# Patient Record
Sex: Female | Born: 1965 | Race: White | Hispanic: No | Smoking: Never smoker
Health system: Southern US, Community
[De-identification: ages and names within clinical notes are randomized; demographics above are authoritative.]

## PROBLEM LIST (undated history)

## (undated) DIAGNOSIS — E079 Disorder of thyroid, unspecified: Secondary | ICD-10-CM

## (undated) DIAGNOSIS — F32A Depression, unspecified: Secondary | ICD-10-CM

## (undated) DIAGNOSIS — I10 Essential (primary) hypertension: Secondary | ICD-10-CM

## (undated) DIAGNOSIS — F329 Major depressive disorder, single episode, unspecified: Secondary | ICD-10-CM

## (undated) DIAGNOSIS — J45909 Unspecified asthma, uncomplicated: Secondary | ICD-10-CM

## (undated) DIAGNOSIS — G629 Polyneuropathy, unspecified: Secondary | ICD-10-CM

## (undated) DIAGNOSIS — F209 Schizophrenia, unspecified: Secondary | ICD-10-CM

## (undated) DIAGNOSIS — F319 Bipolar disorder, unspecified: Secondary | ICD-10-CM

## (undated) HISTORY — PX: ABDOMINAL HYSTERECTOMY: SHX81

## (undated) HISTORY — PX: KNEE SURGERY: SHX244

## (undated) HISTORY — PX: TONSILLECTOMY: SUR1361

---

## 2004-03-13 ENCOUNTER — Emergency Department: Payer: Self-pay | Admitting: Emergency Medicine

## 2004-06-07 ENCOUNTER — Emergency Department: Payer: Self-pay | Admitting: Emergency Medicine

## 2004-06-09 ENCOUNTER — Emergency Department: Payer: Self-pay | Admitting: General Practice

## 2006-12-20 ENCOUNTER — Emergency Department: Payer: Self-pay | Admitting: Emergency Medicine

## 2007-01-14 ENCOUNTER — Other Ambulatory Visit: Payer: Self-pay

## 2007-01-14 ENCOUNTER — Emergency Department: Payer: Self-pay | Admitting: Emergency Medicine

## 2007-02-15 ENCOUNTER — Emergency Department: Payer: Self-pay | Admitting: Emergency Medicine

## 2007-02-17 ENCOUNTER — Emergency Department: Payer: Self-pay | Admitting: Emergency Medicine

## 2007-03-28 ENCOUNTER — Emergency Department: Payer: Self-pay | Admitting: Emergency Medicine

## 2007-03-28 ENCOUNTER — Other Ambulatory Visit: Payer: Self-pay

## 2007-04-25 ENCOUNTER — Emergency Department: Payer: Self-pay | Admitting: Emergency Medicine

## 2007-05-26 ENCOUNTER — Other Ambulatory Visit: Payer: Self-pay

## 2007-05-26 ENCOUNTER — Emergency Department: Payer: Self-pay | Admitting: Emergency Medicine

## 2007-05-27 ENCOUNTER — Emergency Department: Payer: Self-pay | Admitting: Emergency Medicine

## 2007-05-29 ENCOUNTER — Other Ambulatory Visit: Payer: Self-pay

## 2007-05-29 ENCOUNTER — Inpatient Hospital Stay: Payer: Self-pay | Admitting: Internal Medicine

## 2007-06-21 ENCOUNTER — Emergency Department: Payer: Self-pay | Admitting: Emergency Medicine

## 2007-07-14 ENCOUNTER — Emergency Department: Payer: Self-pay | Admitting: Emergency Medicine

## 2007-08-13 ENCOUNTER — Emergency Department: Payer: Self-pay | Admitting: Emergency Medicine

## 2007-10-10 ENCOUNTER — Emergency Department: Payer: Self-pay | Admitting: Emergency Medicine

## 2007-10-10 ENCOUNTER — Other Ambulatory Visit: Payer: Self-pay

## 2007-12-06 ENCOUNTER — Emergency Department: Payer: Self-pay | Admitting: Emergency Medicine

## 2007-12-06 IMAGING — CR DG HAND COMPLETE 3+V*L*
1 series · 3 of 3 positions shown · non-contrast
Comparison: none

REASON FOR EXAM: pain, trauma
COMMENTS:

PROCEDURE:     DXR - DXR HAND LT COMPLETE  W/OBLIQUES  - [DATE]  [DATE]
RESULT:     Images of the LEFT hand demonstrate a fracture through the base
of the distal phalanx of the thumb dorsally. No additional abnormality is
seen.

[Series 1: view not recorded · 0.17mm/px · 3 of 3 slices shown]
[im 1/3]
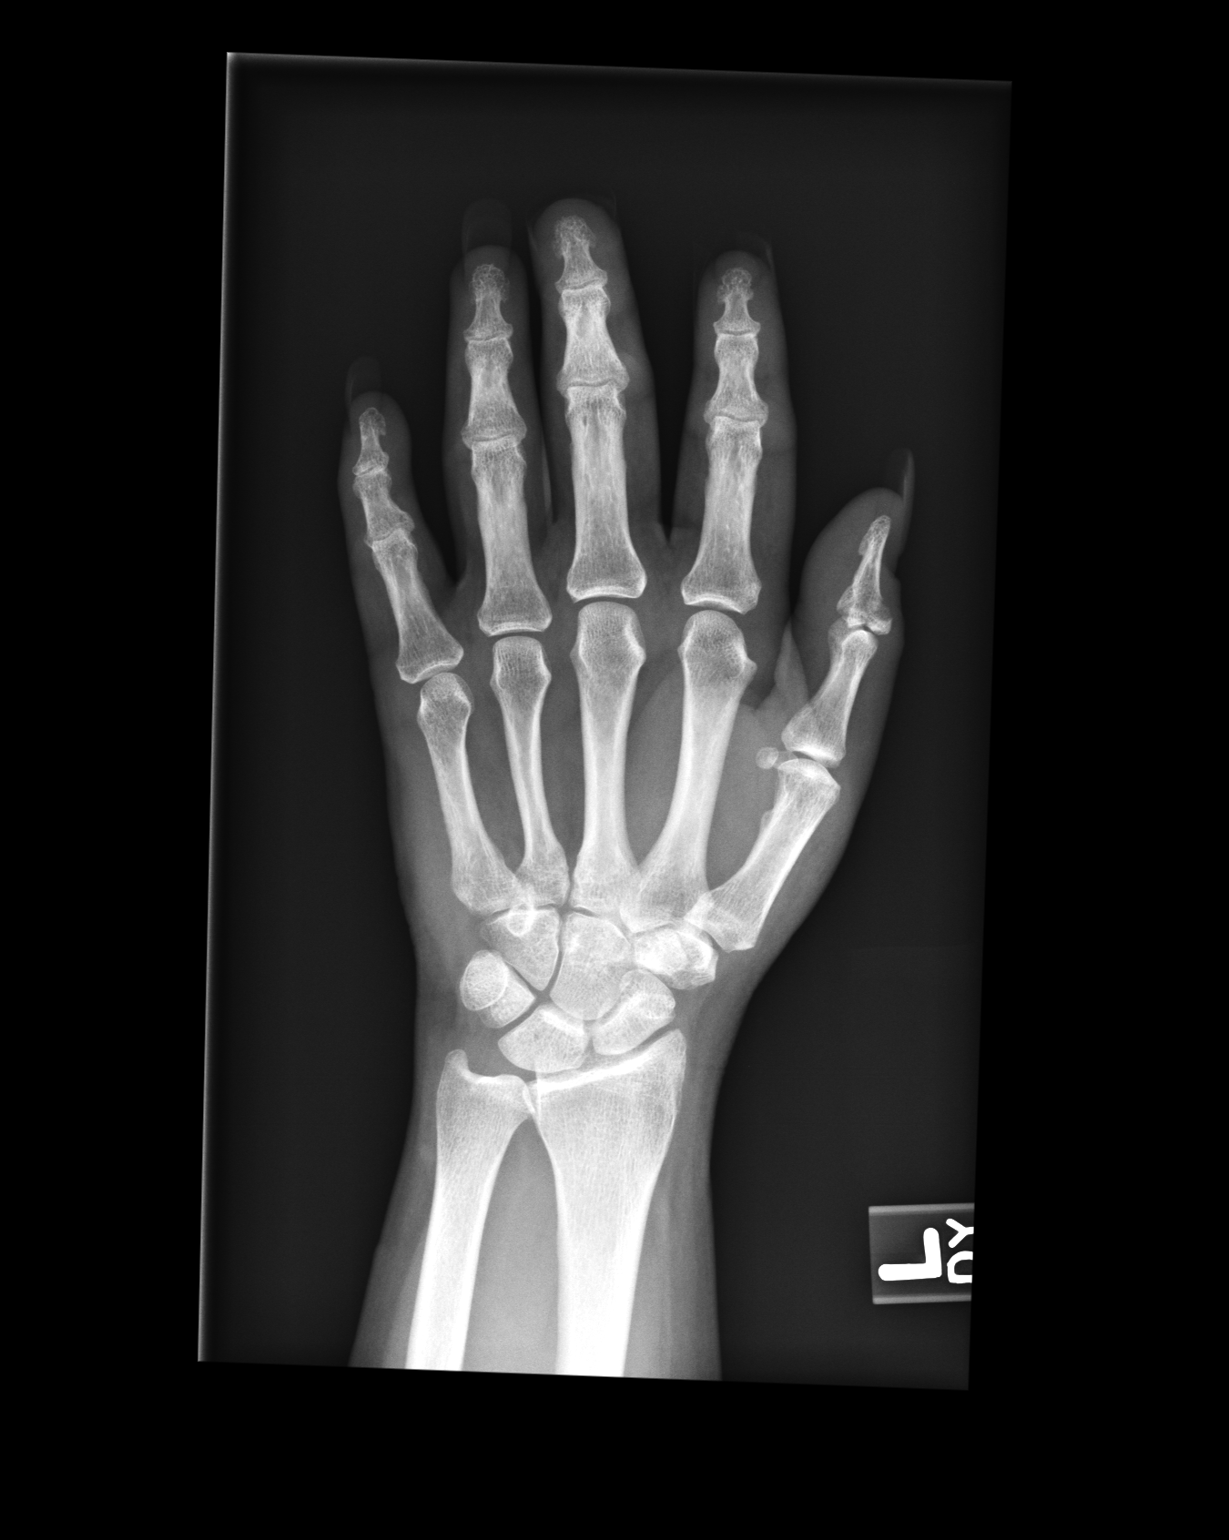
[im 2/3]
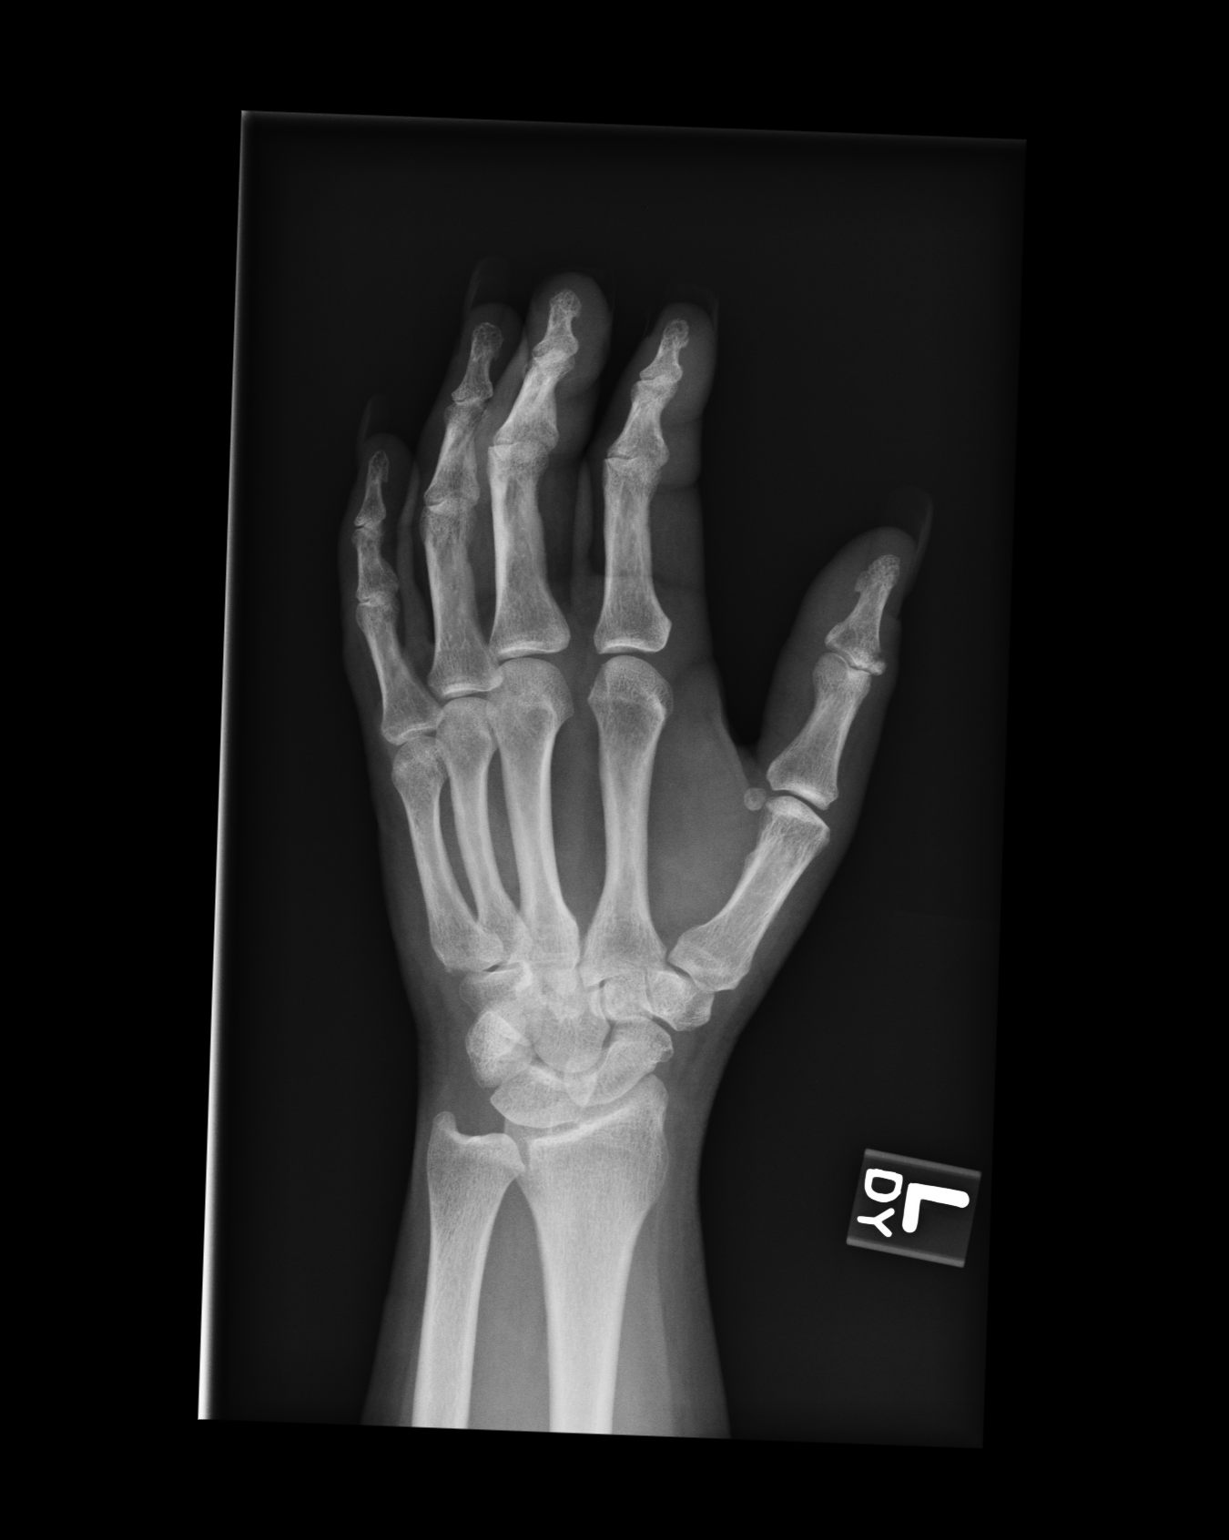
[im 3/3]
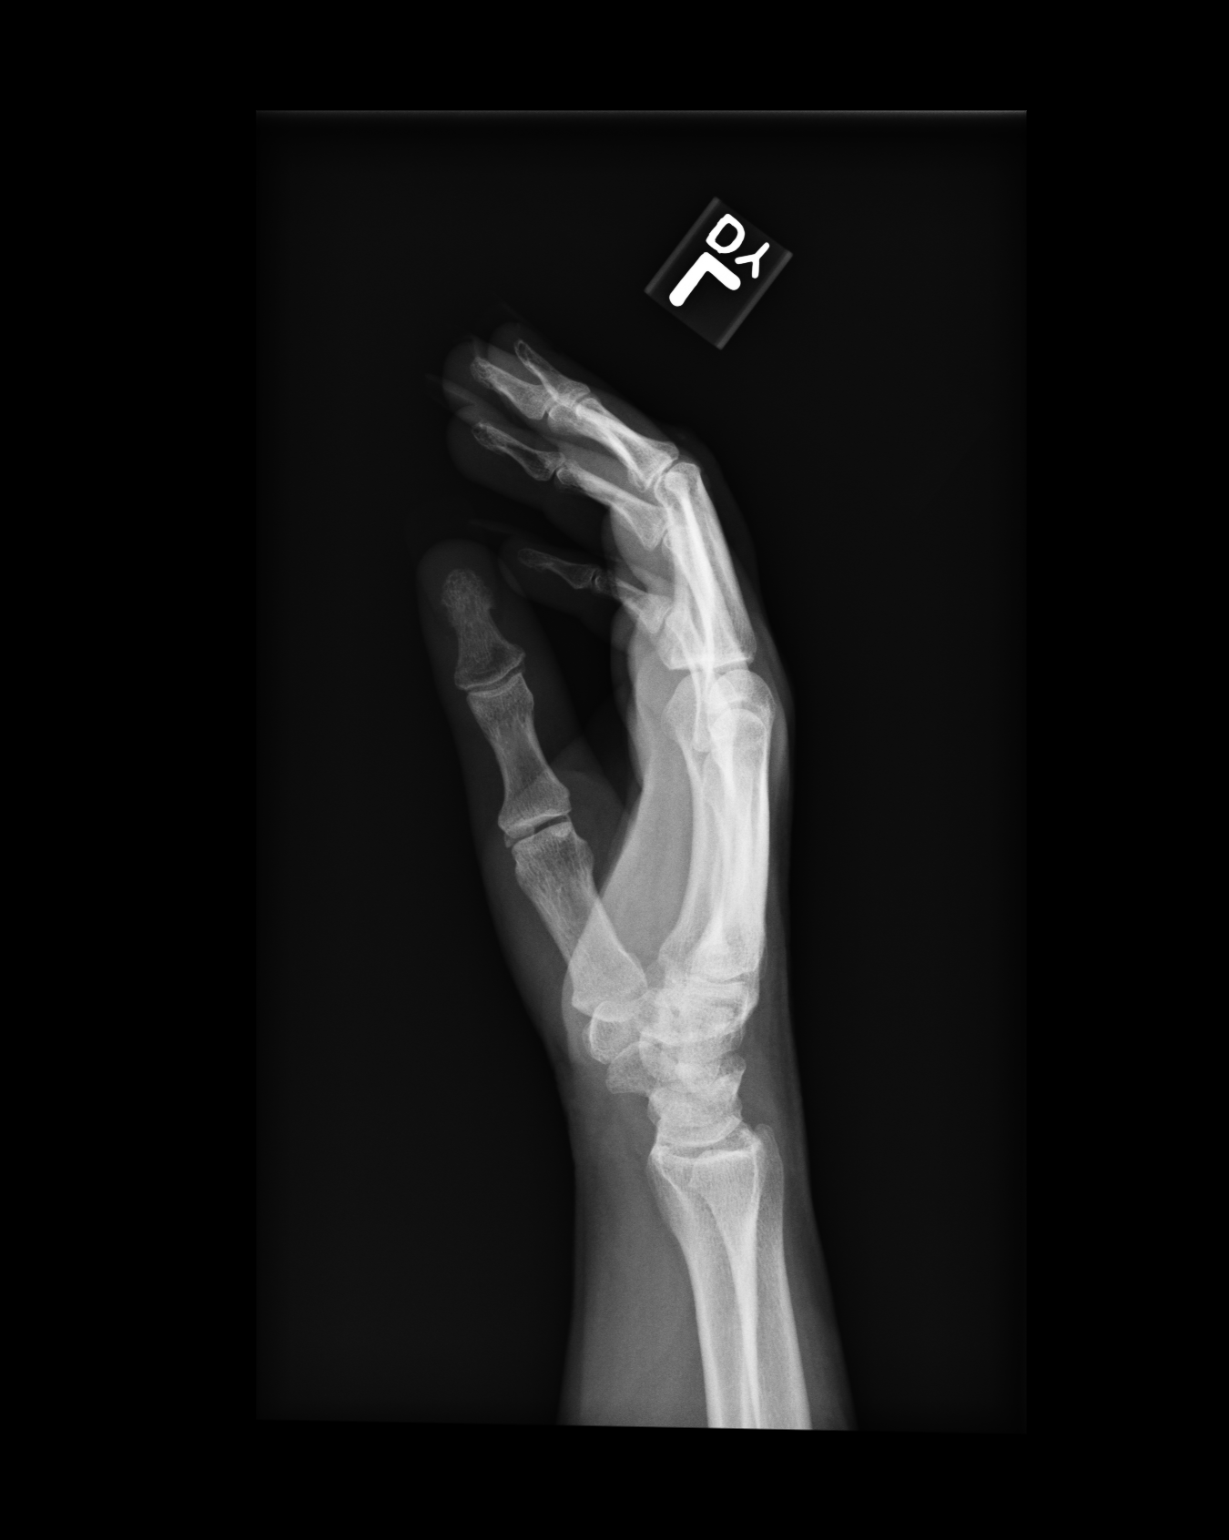

[3 of 3 positions shown; findings below may reference images not displayed]

IMPRESSION: Please see above.

## 2007-12-10 ENCOUNTER — Emergency Department: Payer: Self-pay | Admitting: Emergency Medicine

## 2008-01-11 ENCOUNTER — Emergency Department: Payer: Self-pay | Admitting: Emergency Medicine

## 2008-01-21 ENCOUNTER — Emergency Department: Payer: Self-pay | Admitting: Emergency Medicine

## 2008-03-10 ENCOUNTER — Emergency Department: Payer: Self-pay | Admitting: Internal Medicine

## 2008-04-04 ENCOUNTER — Inpatient Hospital Stay: Payer: Self-pay | Admitting: Unknown Physician Specialty

## 2008-10-19 ENCOUNTER — Emergency Department: Payer: Self-pay | Admitting: Emergency Medicine

## 2010-04-12 ENCOUNTER — Emergency Department: Payer: Self-pay | Admitting: Emergency Medicine

## 2010-04-25 ENCOUNTER — Emergency Department: Payer: Self-pay | Admitting: Emergency Medicine

## 2010-10-21 ENCOUNTER — Emergency Department: Payer: Self-pay | Admitting: Emergency Medicine

## 2011-01-14 ENCOUNTER — Emergency Department: Payer: Self-pay | Admitting: Emergency Medicine

## 2012-06-11 DIAGNOSIS — R32 Unspecified urinary incontinence: Secondary | ICD-10-CM | POA: Insufficient documentation

## 2012-08-08 DIAGNOSIS — N8110 Cystocele, unspecified: Secondary | ICD-10-CM | POA: Insufficient documentation

## 2012-10-02 DIAGNOSIS — N819 Female genital prolapse, unspecified: Secondary | ICD-10-CM | POA: Insufficient documentation

## 2016-01-29 DIAGNOSIS — S0990XA Unspecified injury of head, initial encounter: Secondary | ICD-10-CM | POA: Insufficient documentation

## 2016-01-29 DIAGNOSIS — Y999 Unspecified external cause status: Secondary | ICD-10-CM | POA: Insufficient documentation

## 2016-01-29 DIAGNOSIS — S62636A Displaced fracture of distal phalanx of right little finger, initial encounter for closed fracture: Secondary | ICD-10-CM | POA: Insufficient documentation

## 2016-01-29 DIAGNOSIS — S6991XA Unspecified injury of right wrist, hand and finger(s), initial encounter: Secondary | ICD-10-CM | POA: Diagnosis present

## 2016-01-29 DIAGNOSIS — I10 Essential (primary) hypertension: Secondary | ICD-10-CM | POA: Insufficient documentation

## 2016-01-29 DIAGNOSIS — Y929 Unspecified place or not applicable: Secondary | ICD-10-CM | POA: Insufficient documentation

## 2016-01-29 DIAGNOSIS — Z79899 Other long term (current) drug therapy: Secondary | ICD-10-CM | POA: Diagnosis not present

## 2016-01-29 DIAGNOSIS — Y939 Activity, unspecified: Secondary | ICD-10-CM | POA: Diagnosis not present

## 2016-01-30 ENCOUNTER — Emergency Department: Payer: Medicaid Other

## 2016-01-30 ENCOUNTER — Encounter: Payer: Self-pay | Admitting: *Deleted

## 2016-01-30 ENCOUNTER — Emergency Department
Admission: EM | Admit: 2016-01-30 | Discharge: 2016-01-30 | Disposition: A | Discharge status: Home / Self Care | Payer: Medicaid Other | Attending: Emergency Medicine | Admitting: Emergency Medicine

## 2016-01-30 DIAGNOSIS — S62609A Fracture of unspecified phalanx of unspecified finger, initial encounter for closed fracture: Secondary | ICD-10-CM

## 2016-01-30 HISTORY — DX: Essential (primary) hypertension: I10

## 2016-01-30 HISTORY — DX: Disorder of thyroid, unspecified: E07.9

## 2016-01-30 MED ORDER — IBUPROFEN 600 MG PO TABS
600.0000 mg | ORAL_TABLET | Freq: Once | ORAL | Status: AC
  Administered 2016-01-30: 600 mg via ORAL
  Filled 2016-01-30: qty 1

## 2016-01-30 MED ORDER — TRAMADOL HCL 50 MG PO TABS: 50.0000 mg | ORAL_TABLET | Freq: Four times a day (QID) | ORAL | 0 refills | Status: DC | PRN

## 2016-01-30 MED ORDER — KETOROLAC TROMETHAMINE 30 MG/ML IJ SOLN
30.0000 mg | Freq: Once | INTRAMUSCULAR | Status: AC
  Administered 2016-01-30: 30 mg via INTRAMUSCULAR
  Filled 2016-01-30: qty 1
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
## 2016-01-30 NOTE — ED Notes (Signed)
Right middle through pinky fingers are buddy taped per MD VO.
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
## 2016-01-30 NOTE — ED Notes (Signed)
Patient transported to X-ray 

## 2016-03-17 ENCOUNTER — Emergency Department: Payer: Medicaid Other

## 2016-03-17 ENCOUNTER — Emergency Department
Admission: EM | Admit: 2016-03-17 | Discharge: 2016-03-18 | Disposition: A | Discharge status: Home / Self Care | Payer: Medicaid Other | Attending: Emergency Medicine | Admitting: Emergency Medicine

## 2016-03-17 DIAGNOSIS — G9389 Other specified disorders of brain: Secondary | ICD-10-CM | POA: Insufficient documentation

## 2016-03-17 DIAGNOSIS — F8081 Childhood onset fluency disorder: Secondary | ICD-10-CM | POA: Insufficient documentation

## 2016-03-17 DIAGNOSIS — Z79899 Other long term (current) drug therapy: Secondary | ICD-10-CM | POA: Diagnosis not present

## 2016-03-17 DIAGNOSIS — R079 Chest pain, unspecified: Secondary | ICD-10-CM | POA: Diagnosis not present

## 2016-03-17 DIAGNOSIS — I10 Essential (primary) hypertension: Secondary | ICD-10-CM | POA: Diagnosis not present

## 2016-03-17 DIAGNOSIS — R0602 Shortness of breath: Secondary | ICD-10-CM | POA: Insufficient documentation

## 2016-03-17 LAB — BASIC METABOLIC PANEL
Anion gap: 8 (ref 5–15)
BUN: 17 mg/dL (ref 6–20)
CHLORIDE: 109 mmol/L (ref 101–111)
CO2: 25 mmol/L (ref 22–32)
CREATININE: 1.17 mg/dL — AB (ref 0.44–1.00)
Calcium: 9.1 mg/dL (ref 8.9–10.3)
GFR calc non Af Amer: 53 mL/min — ABNORMAL LOW (ref >60)
Glucose, Bld: 133 mg/dL — ABNORMAL HIGH (ref 65–99)
Potassium: 4.1 mmol/L (ref 3.5–5.1)
SODIUM: 142 mmol/L (ref 135–145)

## 2016-03-17 LAB — CBC
HCT: 39.8 % (ref 35.0–47.0)
HEMOGLOBIN: 13.4 g/dL (ref 12.0–16.0)
MCH: 27.9 pg (ref 26.0–34.0)
MCHC: 33.7 g/dL (ref 32.0–36.0)
MCV: 82.8 fL (ref 80.0–100.0)
Platelets: 207 10*3/uL (ref 150–440)
RBC: 4.8 MIL/uL (ref 3.80–5.20)
RDW: 15.3 % — ABNORMAL HIGH (ref 11.5–14.5)
WBC: 6.4 10*3/uL (ref 3.6–11.0)

## 2016-03-17 LAB — TROPONIN I

## 2016-03-17 MED ORDER — IPRATROPIUM-ALBUTEROL 0.5-2.5 (3) MG/3ML IN SOLN
3.0000 mL | Freq: Once | RESPIRATORY_TRACT | Status: AC
  Administered 2016-03-17: 3 mL via RESPIRATORY_TRACT
  Filled 2016-03-17: qty 3

## 2016-03-17 MED ORDER — TRAMADOL HCL 50 MG PO TABS
50.0000 mg | ORAL_TABLET | Freq: Once | ORAL | Status: AC
  Administered 2016-03-17: 50 mg via ORAL
  Filled 2016-03-17: qty 1

## 2016-03-17 NOTE — ED Triage Notes (Signed)
Pt in with co left sided chest pain that radiates to left arm since yest. Also co "stuttering" since this am, states has a hard time saying her words. Pt is talking clearly at this time with no stuttering noted. Pt has no other neuro complaints at this time.

## 2016-03-17 NOTE — ED Provider Notes (Signed)
[facility] Emergency Department Provider Note   ____________________________________________   I have reviewed the triage vital signs and the nursing notes.   HISTORY  Chief Complaint Chest Pain   History limited by: Not Limited   HPI Teresa Jefferson is a 50 y.o. female who presents to the emergency department today because of concern for chest pain and intermittent stuttering. She states she first noticed the stuttering when she woke up this morning. She has never had stuttering in the past. She did notice however that she would only stutter when she sat up. When she lays flat she has no further stuttering. In addition she has had some chest pain. It is located on the left side of her chest. She has had some radiation of the pain into her left arm. No history of similar symptoms. No fever.   Past Medical History:  Diagnosis Date  . Hypertension   . Thyroid disease     There are no active problems to display for this patient.   Past Surgical History:  Procedure Laterality Date  . ABDOMINAL HYSTERECTOMY    . TONSILLECTOMY      Prior to Admission medications   Medication Sig Start Date End Date Taking? Authorizing Provider  levothyroxine (SYNTHROID, LEVOTHROID) 125 MCG tablet Take 125 mcg by mouth daily before breakfast.    Historical Provider, MD  lisinopril-hydrochlorothiazide (PRINZIDE,ZESTORETIC) 20-25 MG tablet Take 1 tablet by mouth daily.    Historical Provider, MD  traMADol (ULTRAM) 50 MG tablet Take 1 tablet (50 mg total) by mouth every 6 (six) hours as needed. 01/30/16   Daymon Larsen, MD    Allergies Amoxicillin  No family history on file.  Social History Social History  Substance Use Topics  . Smoking status: Never Smoker  . Smokeless tobacco: Never Used  . Alcohol use No    Review of Systems  Constitutional: Negative for fever. Cardiovascular: Positive for chest pain. Respiratory: Positive for shortness of  breath. Gastrointestinal: Negative for abdominal pain, vomiting and diarrhea. Genitourinary: Negative for dysuria. Musculoskeletal: Negative for back pain. Skin: Negative for rash. Neurological: Negative for headaches, focal weakness or numbness. Positive for occasional stuttering.  10-point ROS otherwise negative.  ____________________________________________   PHYSICAL EXAM:  VITAL SIGNS: ED Triage Vitals  Enc Vitals Group     BP 03/17/16 2008 (!) 169/96     Pulse Rate 03/17/16 2006 84     Resp 03/17/16 2006 18     Temp 03/17/16 2006 98.6 F (37 C)     Temp Source 03/17/16 2006 Oral     SpO2 03/17/16 2006 100 %     Weight 03/17/16 2007 178 lb (80.7 kg)     Height 03/17/16 2007 5\' 2"  (1.575 m)     Head Circumference --      Peak Flow --      Pain Score 03/17/16 2007 4   Constitutional: Alert and oriented. Well appearing and in no distress. Eyes: Conjunctivae are normal. Normal extraocular movements. ENT   Head: Normocephalic and atraumatic.   Nose: No congestion/rhinnorhea.   Mouth/Throat: Mucous membranes are moist.   Neck: No stridor. Hematological/Lymphatic/Immunilogical: No cervical lymphadenopathy. Cardiovascular: Normal rate, regular rhythm.  No murmurs, rubs, or gallops.  Respiratory: Normal respiratory effort without tachypnea nor retractions. Breath sounds are clear and equal bilaterally. No wheezes/rales/rhonchi. Gastrointestinal: Soft and nontender. No distention.  Genitourinary: Deferred Musculoskeletal: Normal range of motion in all extremities. No lower extremity edema. Neurologic:  Normal speech and language when  lying flat. When I sat the patient up she did have an occasional stutter - this went away when I laid her flat. Face symmetric. Strength 5/5 in upper and lower extremities. Sensation grossly intact.  Skin:  Skin is warm, dry and intact. No rash noted. Psychiatric: Mood and affect are normal. Speech and behavior are normal. Patient  exhibits appropriate insight and judgment.  ____________________________________________    LABS (pertinent positives/negatives)  Labs Reviewed  CBC - Abnormal; Notable for the following:       Result Value   RDW 15.3 (*)    All other components within normal limits  BASIC METABOLIC PANEL - Abnormal; Notable for the following:    Glucose, Bld 133 (*)    Creatinine, Ser 1.17 (*)    GFR calc non Af Amer 53 (*)    All other components within normal limits  TROPONIN I  TROPONIN I     ____________________________________________   EKG  I, Nance Pear, attending physician, personally viewed and interpreted this EKG  EKG Time: 2007 Rate: 82 Rhythm: normal sinus rhythm Axis: left axis deviation Intervals: qtc 450 QRS: narrow, q waves V2 ST changes: no st elevation Impression: abnormal ekg   ____________________________________________    RADIOLOGY  CXR IMPRESSION:  No active cardiopulmonary disease.    ___________________________________________   PROCEDURES  Procedures  ____________________________________________   INITIAL IMPRESSION / ASSESSMENT AND PLAN / ED COURSE  Pertinent labs & imaging results that were available during my care of the patient were reviewed by me and considered in my medical decision making (see chart for details).  Patient with chest pain and occasional stuttering. On exam patient with no other neuro deficits and the stuttering is positional. Head CT negative. At this point unclear etiology of the stuttering. Did discuss return precautions. In terms of the chest pain, two sets of troponin negative. Think patient is safe for outpatient follow up.   ____________________________________________   FINAL CLINICAL IMPRESSION(S) / ED DIAGNOSES  Final diagnoses:  Nonspecific chest pain  Stuttering     Note: This dictation was prepared with Dragon dictation. Any transcriptional errors that result from this process are  unintentional    Nance Pear, MD 03/18/16 [phone]

## 2016-03-17 NOTE — ED Notes (Signed)
Patient transported to CT 

## 2016-03-18 LAB — TROPONIN I

## 2016-03-18 NOTE — ED Notes (Signed)
Reviewed d/c instructions, follow-up care with pt. Pt verbalized understanding 

## 2016-03-18 NOTE — Discharge Instructions (Signed)
Please seek medical attention for any high fevers, chest pain, shortness of breath, change in behavior, persistent vomiting, bloody stool or any other new or concerning symptoms.  

## 2016-04-06 MED ORDER — PROPOFOL 1000 MG/100ML IV EMUL
INTRAVENOUS | Status: AC
  Filled 2016-04-06: qty 100

## 2016-04-06 MED ORDER — IPRATROPIUM-ALBUTEROL 0.5-2.5 (3) MG/3ML IN SOLN
RESPIRATORY_TRACT | Status: AC
  Filled 2016-04-06: qty 3

## 2016-04-06 MED ORDER — IPRATROPIUM-ALBUTEROL 0.5-2.5 (3) MG/3ML IN SOLN
RESPIRATORY_TRACT | Status: AC
  Filled 2016-04-06: qty 6

## 2016-11-07 ENCOUNTER — Encounter: Payer: Self-pay | Admitting: *Deleted

## 2016-11-07 ENCOUNTER — Emergency Department
Admission: EM | Admit: 2016-11-07 | Discharge: 2016-11-07 | Disposition: A | Discharge status: Home / Self Care | Payer: Medicaid Other | Attending: Emergency Medicine | Admitting: Emergency Medicine

## 2016-11-07 ENCOUNTER — Emergency Department: Payer: Medicaid Other

## 2016-11-07 DIAGNOSIS — I10 Essential (primary) hypertension: Secondary | ICD-10-CM | POA: Diagnosis not present

## 2016-11-07 DIAGNOSIS — R05 Cough: Secondary | ICD-10-CM | POA: Diagnosis present

## 2016-11-07 DIAGNOSIS — J45901 Unspecified asthma with (acute) exacerbation: Secondary | ICD-10-CM | POA: Insufficient documentation

## 2016-11-07 DIAGNOSIS — J069 Acute upper respiratory infection, unspecified: Secondary | ICD-10-CM | POA: Insufficient documentation

## 2016-11-07 DIAGNOSIS — Z79899 Other long term (current) drug therapy: Secondary | ICD-10-CM | POA: Diagnosis not present

## 2016-11-07 LAB — BASIC METABOLIC PANEL
ANION GAP: 6 (ref 5–15)
BUN: 10 mg/dL (ref 6–20)
CO2: 30 mmol/L (ref 22–32)
CREATININE: 0.84 mg/dL (ref 0.44–1.00)
Calcium: 9.5 mg/dL (ref 8.9–10.3)
Chloride: 101 mmol/L (ref 101–111)
Glucose, Bld: 119 mg/dL — ABNORMAL HIGH (ref 65–99)
Potassium: 3.5 mmol/L (ref 3.5–5.1)
SODIUM: 137 mmol/L (ref 135–145)

## 2016-11-07 LAB — CBC
HCT: 39 % (ref 35.0–47.0)
Hemoglobin: 13 g/dL (ref 12.0–16.0)
MCH: 27.4 pg (ref 26.0–34.0)
MCHC: 33.3 g/dL (ref 32.0–36.0)
MCV: 82.2 fL (ref 80.0–100.0)
PLATELETS: 198 10*3/uL (ref 150–440)
RBC: 4.74 MIL/uL (ref 3.80–5.20)
RDW: 14.9 % — ABNORMAL HIGH (ref 11.5–14.5)
WBC: 5.7 10*3/uL (ref 3.6–11.0)

## 2016-11-07 LAB — TROPONIN I

## 2016-11-07 MED ORDER — HYDROCOD POLST-CPM POLST ER 10-8 MG/5ML PO SUER
5.0000 mL | Freq: Once | ORAL | Status: AC
  Administered 2016-11-07: 5 mL via ORAL
  Filled 2016-11-07: qty 5

## 2016-11-07 MED ORDER — ONDANSETRON 4 MG PO TBDP
ORAL_TABLET | ORAL | Status: AC
  Filled 2016-11-07: qty 1

## 2016-11-07 MED ORDER — QUETIAPINE FUMARATE 400 MG PO TABS: 400.0000 mg | ORAL_TABLET | Freq: Every day | ORAL | 0 refills | Status: DC

## 2016-11-07 MED ORDER — PREDNISONE 20 MG PO TABS
60.0000 mg | ORAL_TABLET | Freq: Once | ORAL | Status: AC
  Administered 2016-11-07: 60 mg via ORAL
  Filled 2016-11-07: qty 3

## 2016-11-07 MED ORDER — BENZONATATE 100 MG PO CAPS: 100.0000 mg | ORAL_CAPSULE | Freq: Four times a day (QID) | ORAL | 0 refills | Status: AC | PRN

## 2016-11-07 MED ORDER — ONDANSETRON 4 MG PO TBDP
4.0000 mg | ORAL_TABLET | Freq: Once | ORAL | Status: AC
  Administered 2016-11-07: 4 mg via ORAL

## 2016-11-07 MED ORDER — HYDROCOD POLST-CPM POLST ER 10-8 MG/5ML PO SUER
ORAL | Status: AC
  Filled 2016-11-07: qty 5

## 2016-11-07 MED ORDER — PREDNISONE 20 MG PO TABS: 40.0000 mg | ORAL_TABLET | Freq: Every day | ORAL | 0 refills | Status: DC

## 2016-11-07 NOTE — ED Notes (Signed)
Pt given coke to drink 

## 2016-11-07 NOTE — ED Notes (Signed)
Pt states that her daughter is picking her up and driving her home. Pt was advised not to drive since she received narcotics.

## 2016-11-07 NOTE — ED Triage Notes (Signed)
Pt reports chest pain starting yesterday with a productive cough, pt denies fever, reports coughing up green sputum, pt reports chest pain is present all the time, increases with coughing

## 2016-11-07 NOTE — ED Notes (Signed)
Pt ambulatory to room, states cough and CP that began yest. States productive, coughing up green. Denies fever. Alert and oriented. Strong congested cough. She states she feels like congestion is all in chest. Denies facial pressure.

## 2016-11-07 NOTE — ED Provider Notes (Signed)
[facility] Emergency Department Provider Note  Time seen: 8:56 PM  I have reviewed the triage vital signs and the nursing notes.   HISTORY  Chief Complaint Chest Pain and Cough    HPI Teresa Jefferson is a 51 y.o. female with a past medical history of hypertension, asthma, presents the emergency department for cough. According to the patient the past 2 days she has been coughing with sputum production. States mild shortness of breath. Denies any history of COPD. Patient does use a nebulizer at home for her asthma. States mild chest discomfort when she coughs. Denies any leg pain or swelling. Denies abdominal pain. States nausea but denies vomiting or diarrhea. Denies any known fever. As a secondary complaint the patient also states she ran out of Seroquel yesterday and normally takes 400 mg each night. Is requesting a refill.  Past Medical History:  Diagnosis Date  . Hypertension   . Thyroid disease     There are no active problems to display for this patient.   Past Surgical History:  Procedure Laterality Date  . ABDOMINAL HYSTERECTOMY    . TONSILLECTOMY      Prior to Admission medications   Medication Sig Start Date End Date Taking? Authorizing Provider  levothyroxine (SYNTHROID, LEVOTHROID) 125 MCG tablet Take 125 mcg by mouth daily before breakfast.    [provider]  lisinopril-hydrochlorothiazide (PRINZIDE,ZESTORETIC) 20-25 MG tablet Take 1 tablet by mouth daily.    [provider]  traMADol (ULTRAM) 50 MG tablet Take 1 tablet (50 mg total) by mouth every 6 (six) hours as needed. 01/30/16   Daymon Larsen, MD    Allergies  Allergen Reactions  . Amoxicillin Rash    No family history on file.  Social History Social History  Substance Use Topics  . Smoking status: Never Smoker  . Smokeless tobacco: Never Used  . Alcohol use No    Review of Systems Constitutional: Negative for fever. Eyes: Negative for visual  changes. ENT: Positive for congestion Cardiovascular: Mild chest discomfort with cough Respiratory: Positive shortness of breath. Positive for cough, patient states with sputum production. Gastrointestinal: Negative for abdominal pain, vomiting and diarrhea. Positive for nausea Musculoskeletal: Negative for back pain. Neurological: Negative for headache All other ROS negative  ____________________________________________   PHYSICAL EXAM:  VITAL SIGNS: ED Triage Vitals [11/07/16 1945]  Enc Vitals Group     BP (!) 175/95     Pulse Rate 94     Resp 20     Temp 98.6 F (37 C)     Temp Source Oral     SpO2 97 %     Weight 178 lb (80.7 kg)     Height 5\' 2"  (1.575 m)     Head Circumference      Peak Flow      Pain Score 8     Pain Loc      Pain Edu?      Excl. in [place]?     Constitutional: Alert and oriented. Well appearing and in no distress. Eyes: Normal exam ENT   Head: Normocephalic and atraumatic   Mouth/Throat: Mucous membranes are moist. Cardiovascular: Normal rate, regular rhythm. No murmur Respiratory: Normal respiratory effort without tachypnea nor retractions. Mild expiratory wheeze bilaterally. Gastrointestinal: Soft and nontender. No distention. Musculoskeletal: Nontender with normal range of motion in all extremities. No lower extremity tenderness or edema. Neurologic:  Normal speech and language. No gross focal neurologic deficits  Skin:  Skin is warm, dry  and intact.  Psychiatric: Mood and affect are normal.   ____________________________________________    EKG  EKG reviewed and interpreted by myself shows normal sinus rhythm at 98 bpm, narrow QRS, normal axis, normal intervals, no concerning ST changes.  ____________________________________________    RADIOLOGY  Chest x-ray negative  ____________________________________________   INITIAL IMPRESSION / ASSESSMENT AND PLAN / ED COURSE  Pertinent labs & imaging results that were available  during my care of the patient were reviewed by me and considered in my medical decision making (see chart for details).  Patient presents the emergency department for 2 days of cough with congestion. No fever. Labs are normal including negative troponin, normal white blood cell count. Patient's chest x-ray is negative. EKG is reassuring. Patient's symptoms are most suggestive of an upper respiratory infection exacerbating underlying asthma. There he minimal expiratory wheeze on exam. We will dose prednisone. Patient states she has her albuterol nebulizer which she can take at home. I discussed with the patient a prescription for 3 days of Seroquel so she can get in with her doctor this week for a refill. Patient is agreeable to this plan.  ____________________________________________   FINAL CLINICAL IMPRESSION(S) / ED DIAGNOSES  Upper respiratory infection Asthma exacerbation    Harvest Dark, MD 11/07/16 2100

## 2016-11-09 ENCOUNTER — Other Ambulatory Visit: Payer: Self-pay

## 2016-11-09 ENCOUNTER — Encounter: Payer: Self-pay | Admitting: Medical Oncology

## 2016-11-09 ENCOUNTER — Emergency Department: Payer: Medicaid Other

## 2016-11-09 ENCOUNTER — Emergency Department
Admission: EM | Admit: 2016-11-09 | Discharge: 2016-11-09 | Disposition: A | Discharge status: Home / Self Care | Payer: Medicaid Other | Attending: Emergency Medicine | Admitting: Emergency Medicine

## 2016-11-09 DIAGNOSIS — I10 Essential (primary) hypertension: Secondary | ICD-10-CM | POA: Diagnosis not present

## 2016-11-09 DIAGNOSIS — Z79899 Other long term (current) drug therapy: Secondary | ICD-10-CM | POA: Diagnosis not present

## 2016-11-09 DIAGNOSIS — E079 Disorder of thyroid, unspecified: Secondary | ICD-10-CM | POA: Insufficient documentation

## 2016-11-09 DIAGNOSIS — J40 Bronchitis, not specified as acute or chronic: Secondary | ICD-10-CM | POA: Diagnosis not present

## 2016-11-09 DIAGNOSIS — R079 Chest pain, unspecified: Secondary | ICD-10-CM | POA: Diagnosis present

## 2016-11-09 LAB — CBC
HEMATOCRIT: 41.5 % (ref 35.0–47.0)
HEMOGLOBIN: 14 g/dL (ref 12.0–16.0)
MCH: 27.6 pg (ref 26.0–34.0)
MCHC: 33.8 g/dL (ref 32.0–36.0)
MCV: 81.7 fL (ref 80.0–100.0)
PLATELETS: 243 10*3/uL (ref 150–440)
RBC: 5.08 MIL/uL (ref 3.80–5.20)
RDW: 15.1 % — ABNORMAL HIGH (ref 11.5–14.5)
WBC: 8.7 10*3/uL (ref 3.6–11.0)

## 2016-11-09 LAB — BASIC METABOLIC PANEL
Anion gap: 10 (ref 5–15)
BUN: 17 mg/dL (ref 6–20)
CHLORIDE: 101 mmol/L (ref 101–111)
CO2: 29 mmol/L (ref 22–32)
CREATININE: 0.84 mg/dL (ref 0.44–1.00)
Calcium: 9.4 mg/dL (ref 8.9–10.3)
GFR calc non Af Amer: >60 mL/min (ref >60)
Glucose, Bld: 130 mg/dL — ABNORMAL HIGH (ref 65–99)
POTASSIUM: 3.3 mmol/L — AB (ref 3.5–5.1)
Sodium: 140 mmol/L (ref 135–145)

## 2016-11-09 LAB — TROPONIN I: Troponin I: <0.03 ng/mL (ref <0.03)

## 2016-11-09 MED ORDER — IPRATROPIUM-ALBUTEROL 0.5-2.5 (3) MG/3ML IN SOLN
3.0000 mL | Freq: Once | RESPIRATORY_TRACT | Status: AC
  Administered 2016-11-09: 3 mL via RESPIRATORY_TRACT
  Filled 2016-11-09: qty 3

## 2016-11-09 MED ORDER — IBUPROFEN 400 MG PO TABS
600.0000 mg | ORAL_TABLET | Freq: Once | ORAL | Status: AC
  Administered 2016-11-09: 600 mg via ORAL
  Filled 2016-11-09: qty 2

## 2016-11-09 MED ORDER — AZITHROMYCIN 250 MG PO TABS: ORAL_TABLET | ORAL | 0 refills | Status: AC

## 2016-11-09 MED ORDER — QUETIAPINE FUMARATE 400 MG PO TABS: 400.0000 mg | ORAL_TABLET | Freq: Every day | ORAL | 0 refills | Status: DC

## 2016-11-09 MED ORDER — ACETAMINOPHEN 500 MG PO TABS
1000.0000 mg | ORAL_TABLET | Freq: Once | ORAL | Status: AC
  Administered 2016-11-09: 1000 mg via ORAL
  Filled 2016-11-09 (×2): qty 2

## 2016-11-09 MED ORDER — PREDNISONE 10 MG PO TABS: 50.0000 mg | ORAL_TABLET | Freq: Every day | ORAL | 0 refills | Status: AC

## 2016-11-09 MED ORDER — LIDOCAINE HCL (PF) 1 % IJ SOLN
5.0000 mL | Freq: Once | INTRAMUSCULAR | Status: AC
  Administered 2016-11-09: 5 mL
  Filled 2016-11-09 (×2): qty 5

## 2016-11-09 NOTE — ED Triage Notes (Signed)
Pt reports that she was here 2 days ago with URI states that she was taking prednisone but it has not helped. Pt reports central chest pain that is stabbing pain. Pt reports cough that is productive.

## 2016-11-09 NOTE — ED Notes (Signed)
ED Provider at bedside. 

## 2016-11-09 NOTE — ED Provider Notes (Signed)
[facility] Emergency Department Provider Note  ____________________________________________   First MD Initiated Contact with Patient 11/09/16 1520     (approximate)  I have reviewed the triage vital signs and the nursing notes.   HISTORY  Chief Complaint Cough and Chest Pain    HPI Teresa Jefferson is a 51 y.o. female who presents to the emergency department with 3 days of worsening cough and shortness of breath. She was seen in our emergency department 2 days ago and diagnosed with an asthma exacerbation. She was prescribed prednisone which she says is not helping and was unable to afford the Tessalon Perle she was prescribed. She is using over-the-counter cough medication with minimal relief. She is a long-standing history of asthma but is never been intubated. No history of COPD. She has moderate severity sharp diffuse upper chest pain worse when coughing improved when not coughing. The pain is nonradiating. Is slowly worsening. She is now coughing up a small amount of yellowish-green phlegm.   Past Medical History:  Diagnosis Date  . Hypertension   . Thyroid disease     There are no active problems to display for this patient.   Past Surgical History:  Procedure Laterality Date  . ABDOMINAL HYSTERECTOMY    . TONSILLECTOMY      Prior to Admission medications   Medication Sig Start Date End Date Taking? Authorizing Provider  azithromycin (ZITHROMAX Z-PAK) 250 MG tablet Take 2 tablets (500 mg) on  Day 1,  followed by 1 tablet (250 mg) once daily on Days 2 through 5. 11/09/16 11/14/16  Darel Hong, MD  benzonatate (TESSALON PERLES) 100 MG capsule Take 1 capsule (100 mg total) by mouth every 6 (six) hours as needed for cough. 11/07/16 11/07/17  Harvest Dark, MD  levothyroxine (SYNTHROID, LEVOTHROID) 125 MCG tablet Take 125 mcg by mouth daily before breakfast.    [provider]  lisinopril-hydrochlorothiazide (PRINZIDE,ZESTORETIC)  20-25 MG tablet Take 1 tablet by mouth daily.    [provider]  predniSONE (DELTASONE) 10 MG tablet Take 5 tablets (50 mg total) by mouth daily. 11/09/16 11/12/16  Darel Hong, MD  QUEtiapine (SEROQUEL) 400 MG tablet Take 1 tablet (400 mg total) by mouth at bedtime. 11/09/16   Darel Hong, MD  traMADol (ULTRAM) 50 MG tablet Take 1 tablet (50 mg total) by mouth every 6 (six) hours as needed. 01/30/16   Daymon Larsen, MD    Allergies Amoxicillin  No family history on file.  Social History Social History  Substance Use Topics  . Smoking status: Never Smoker  . Smokeless tobacco: Never Used  . Alcohol use No    Review of Systems Constitutional: No fever/chills Eyes: No visual changes. ENT: No sore throat. Cardiovascular: Positive chest pain. Respiratory: Positive shortness of breath. Gastrointestinal: No abdominal pain.  No nausea, no vomiting.  No diarrhea.  No constipation. Genitourinary: Negative for dysuria. Musculoskeletal: Negative for back pain. Skin: Negative for rash. Neurological: Positive for headache   ____________________________________________   PHYSICAL EXAM:  VITAL SIGNS: ED Triage Vitals  Enc Vitals Group     BP 11/09/16 1311 (!) 186/126     Pulse Rate 11/09/16 1310 76     Resp 11/09/16 1310 20     Temp 11/09/16 1310 98 F (36.7 C)     Temp Source 11/09/16 1310 Oral     SpO2 11/09/16 1310 99 %     Weight 11/09/16 1311 176 lb (79.8 kg)     Height 11/09/16 1311  5\' 2"  (1.575 m)     Head Circumference --      Peak Flow --      Pain Score 11/09/16 1319 6     Pain Loc --      Pain Edu? --      Excl. in [place]? --     Constitutional: Alert and oriented 4 slightly increased respiratory rate and coughing with dry cough while we are talking Eyes: PERRL EOMI. Head: Atraumatic. Nose: No congestion/rhinnorhea. Mouth/Throat: No trismus Neck: No stridor.   Cardiovascular: Normal rate, regular rhythm. Grossly normal heart sounds.  Good  peripheral circulation. Respiratory: Slightly increased respiratory effort with diffuse wheezes throughout but with some rhonchi in left upper lobe Gastrointestinal: Soft nontender Musculoskeletal: No lower extremity edema   Neurologic:  Normal speech and language. No gross focal neurologic deficits are appreciated. Skin:  Skin is warm, dry and intact. No rash noted. Psychiatric: Mood and affect are normal. Speech and behavior are normal.    ____________________________________________   DIFFERENTIAL includes but not limited to  COPD exacerbation, asthma exacerbation, pneumonia, bronchitis, pulmonary embolism ____________________________________________   LABS (all labs ordered are listed, but only abnormal results are displayed)  Labs Reviewed  BASIC METABOLIC PANEL - Abnormal; Notable for the following:       Result Value   Potassium 3.3 (*)    Glucose, Bld 130 (*)    All other components within normal limits  CBC - Abnormal; Notable for the following:    RDW 15.1 (*)    All other components within normal limits  TROPONIN I    No signs of acute ischemia __________________________________________  EKG  ED ECG REPORT I, Darel Hong, the attending physician, personally viewed and interpreted this ECG.  Date: 11/09/2016 Rate: 66 Rhythm: normal sinus rhythm QRS Axis: normal Intervals: normal ST/T Wave abnormalities: normal Narrative Interpretation: Abnormal-LVH  ____________________________________________  RADIOLOGY  Chest x-ray with chronic changes but no acute disease ____________________________________________   PROCEDURES  Procedure(s) performed: no  Procedures  Critical Care performed: no  Observation: no ____________________________________________   INITIAL IMPRESSION / ASSESSMENT AND PLAN / ED COURSE  Pertinent labs & imaging results that were available during my care of the patient were reviewed by me and considered in my medical  decision making (see chart for details).  The patient arrives uncomfortable appearing with significant amount of wheezing And productive cough. At the very minimum she has bronchitis but may actually have progressed on to pneumonia. Her primary concern is cough so my first breathing treatment will be with 5 cc of 1% lidocaine without epinephrine and then a reevaluation.   _After the nebulization of lidocaine and a breathing treatment the patient's cough is resolved. She feels improved. While I'm not completely convinced that she has pneumonia I will prescribe her azithromycin as a delayed prescription and she understands to fill it only showed her symptoms worsen. She is discharged home in improved condition.of note she normally takes 400 mg of Seroquel nightly. She is down to her last tablet. She understands that she needs to follow up with [facility] for her further psychiatric care, however I will prescribe her a one-week course. ___________________________________________   FINAL CLINICAL IMPRESSION(S) / ED DIAGNOSES  Final diagnoses:  Bronchitis      NEW MEDICATIONS STARTED DURING THIS VISIT:  Discharge Medication List as of 11/09/2016  5:06 PM    START taking these medications   Details  azithromycin (ZITHROMAX Z-PAK) 250 MG tablet Take 2 tablets (500 mg) on  Day 1,  followed by 1 tablet (250 mg) once daily on Days 2 through 5., Print         Note:  This document was prepared using Dragon voice recognition software and may include unintentional dictation errors.     Darel Hong, MD 11/09/16 2153

## 2016-11-09 NOTE — Discharge Instructions (Signed)
Please make sure you take 3 more days worth of prednisone and use your inhaler as needed for shortness of breath. Take your antibiotics only if he feels like his symptoms are starting to worsen. Make sure you follow up with [facility] within the next week for any further refills of your seroquel.  Return to the ED for any concerns.  It was a pleasure to take care of you today, and thank you for coming to our emergency department.  If you have any questions or concerns before leaving please ask the nurse to grab me and I'm more than happy to go through your aftercare instructions again.  If you were prescribed any opioid pain medication today such as Norco, Vicodin, Percocet, morphine, hydrocodone, or oxycodone please make sure you do not drive when you are taking this medication as it can alter your ability to drive safely.  If you have any concerns once you are home that you are not improving or are in fact getting worse before you can make it to your follow-up appointment, please do not hesitate to call 911 and come back for further evaluation.  Darel Hong MD  Results for orders placed or performed during the hospital encounter of 62/03/55  Basic metabolic panel  Result Value Ref Range   Sodium 140 135 - 145 mmol/L   Potassium 3.3 (L) 3.5 - 5.1 mmol/L   Chloride 101 101 - 111 mmol/L   CO2 29 22 - 32 mmol/L   Glucose, Bld 130 (H) 65 - 99 mg/dL   BUN 17 6 - 20 mg/dL   Creatinine, Ser 0.84 0.44 - 1.00 mg/dL   Calcium 9.4 8.9 - 10.3 mg/dL   GFR calc non Af Amer >60 >60 mL/min   GFR calc Af Amer >60 >60 mL/min   Anion gap 10 5 - 15  CBC  Result Value Ref Range   WBC 8.7 3.6 - 11.0 K/uL   RBC 5.08 3.80 - 5.20 MIL/uL   Hemoglobin 14.0 12.0 - 16.0 g/dL   HCT 41.5 35.0 - 47.0 %   MCV 81.7 80.0 - 100.0 fL   MCH 27.6 26.0 - 34.0 pg   MCHC 33.8 32.0 - 36.0 g/dL   RDW 15.1 (H) 11.5 - 14.5 %   Platelets 243 150 - 440 K/uL  Troponin I  Result Value Ref Range   Troponin I <0.03 <0.03 ng/mL     Dg Chest 2 View  Result Date: 11/09/2016 CLINICAL DATA:  Chest pain, cough and shortness of breath. EXAM: CHEST  2 VIEW COMPARISON:  11/07/2016 FINDINGS: The cardiac silhouette, mediastinal and hilar contours are within normal limits and stable. Mild tortuosity of the thoracic aorta. Chronic lingular scarring changes. Chronic bronchitic changes. No acute overlying pulmonary findings. No pleural effusion. The bony thorax is intact. IMPRESSION: Chronic bronchitic changes and chronic lingular scarring. No acute overlying pulmonary process. Electronically Signed   By: Marijo Sanes M.D.   On: 11/09/2016 13:44   Dg Chest 2 View  Result Date: 11/07/2016 CLINICAL DATA:  Chest pain EXAM: CHEST  2 VIEW COMPARISON:  03/17/2016 FINDINGS: Lingular scarring. Right lung clear. Heart is normal size. No effusions or acute bony abnormality. IMPRESSION: Lingular scarring.  No active disease. Electronically Signed   By: Rolm Baptise M.D.   On: 11/07/2016 20:05

## 2017-04-29 ENCOUNTER — Emergency Department
Admission: EM | Admit: 2017-04-29 | Discharge: 2017-04-30 | Disposition: A | Discharge status: Other Acute Inpatient Hospital | Payer: Medicaid Other | Attending: Emergency Medicine | Admitting: Emergency Medicine

## 2017-04-29 ENCOUNTER — Other Ambulatory Visit: Payer: Self-pay

## 2017-04-29 ENCOUNTER — Encounter: Payer: Self-pay | Admitting: Emergency Medicine

## 2017-04-29 ENCOUNTER — Emergency Department: Payer: Medicaid Other

## 2017-04-29 DIAGNOSIS — S62631B Displaced fracture of distal phalanx of left index finger, initial encounter for open fracture: Secondary | ICD-10-CM | POA: Insufficient documentation

## 2017-04-29 DIAGNOSIS — Y999 Unspecified external cause status: Secondary | ICD-10-CM | POA: Insufficient documentation

## 2017-04-29 DIAGNOSIS — Y939 Activity, unspecified: Secondary | ICD-10-CM | POA: Insufficient documentation

## 2017-04-29 DIAGNOSIS — Z79899 Other long term (current) drug therapy: Secondary | ICD-10-CM | POA: Insufficient documentation

## 2017-04-29 DIAGNOSIS — F319 Bipolar disorder, unspecified: Secondary | ICD-10-CM | POA: Insufficient documentation

## 2017-04-29 DIAGNOSIS — F329 Major depressive disorder, single episode, unspecified: Secondary | ICD-10-CM | POA: Diagnosis not present

## 2017-04-29 DIAGNOSIS — F1910 Other psychoactive substance abuse, uncomplicated: Secondary | ICD-10-CM | POA: Insufficient documentation

## 2017-04-29 DIAGNOSIS — Z0289 Encounter for other administrative examinations: Secondary | ICD-10-CM | POA: Diagnosis not present

## 2017-04-29 DIAGNOSIS — Z880 Allergy status to penicillin: Secondary | ICD-10-CM | POA: Insufficient documentation

## 2017-04-29 DIAGNOSIS — F209 Schizophrenia, unspecified: Secondary | ICD-10-CM | POA: Insufficient documentation

## 2017-04-29 DIAGNOSIS — R45851 Suicidal ideations: Secondary | ICD-10-CM | POA: Insufficient documentation

## 2017-04-29 DIAGNOSIS — W19XXXA Unspecified fall, initial encounter: Secondary | ICD-10-CM | POA: Diagnosis not present

## 2017-04-29 DIAGNOSIS — I10 Essential (primary) hypertension: Secondary | ICD-10-CM | POA: Insufficient documentation

## 2017-04-29 DIAGNOSIS — Y929 Unspecified place or not applicable: Secondary | ICD-10-CM | POA: Diagnosis not present

## 2017-04-29 HISTORY — DX: Major depressive disorder, single episode, unspecified: F32.9

## 2017-04-29 HISTORY — DX: Depression, unspecified: F32.A

## 2017-04-29 HISTORY — DX: Bipolar disorder, unspecified: F31.9

## 2017-04-29 HISTORY — DX: Schizophrenia, unspecified: F20.9

## 2017-04-29 HISTORY — DX: Polyneuropathy, unspecified: G62.9

## 2017-04-29 LAB — COMPREHENSIVE METABOLIC PANEL
ALK PHOS: 74 U/L (ref 38–126)
ALT: 17 U/L (ref 14–54)
AST: 22 U/L (ref 15–41)
Albumin: 4.2 g/dL (ref 3.5–5.0)
Anion gap: 8 (ref 5–15)
BUN: 22 mg/dL — AB (ref 6–20)
CALCIUM: 9.3 mg/dL (ref 8.9–10.3)
CHLORIDE: 108 mmol/L (ref 101–111)
CO2: 24 mmol/L (ref 22–32)
CREATININE: 1.2 mg/dL — AB (ref 0.44–1.00)
GFR calc Af Amer: 60 mL/min — ABNORMAL LOW (ref >60)
GFR calc non Af Amer: 51 mL/min — ABNORMAL LOW (ref >60)
Glucose, Bld: 121 mg/dL — ABNORMAL HIGH (ref 65–99)
Potassium: 3.4 mmol/L — ABNORMAL LOW (ref 3.5–5.1)
SODIUM: 140 mmol/L (ref 135–145)
Total Bilirubin: 0.4 mg/dL (ref 0.3–1.2)
Total Protein: 7.8 g/dL (ref 6.5–8.1)

## 2017-04-29 LAB — URINE DRUG SCREEN, QUALITATIVE (ARMC ONLY)
AMPHETAMINES, UR SCREEN: NOT DETECTED
BARBITURATES, UR SCREEN: NOT DETECTED
BENZODIAZEPINE, UR SCRN: POSITIVE — AB
Cannabinoid 50 Ng, Ur ~~LOC~~: NOT DETECTED
Cocaine Metabolite,Ur ~~LOC~~: POSITIVE — AB
MDMA (Ecstasy)Ur Screen: NOT DETECTED
METHADONE SCREEN, URINE: NOT DETECTED
Opiate, Ur Screen: NOT DETECTED
Phencyclidine (PCP) Ur S: NOT DETECTED
TRICYCLIC, UR SCREEN: POSITIVE — AB

## 2017-04-29 LAB — CBC
HCT: 40 % (ref 35.0–47.0)
Hemoglobin: 13.2 g/dL (ref 12.0–16.0)
MCH: 27.5 pg (ref 26.0–34.0)
MCHC: 33.1 g/dL (ref 32.0–36.0)
MCV: 83.1 fL (ref 80.0–100.0)
PLATELETS: 252 10*3/uL (ref 150–440)
RBC: 4.82 MIL/uL (ref 3.80–5.20)
RDW: 14.7 % — AB (ref 11.5–14.5)
WBC: 6.4 10*3/uL (ref 3.6–11.0)

## 2017-04-29 LAB — ETHANOL: Alcohol, Ethyl (B): <10 mg/dL (ref <10)

## 2017-04-29 LAB — SALICYLATE LEVEL: Salicylate Lvl: <7 mg/dL (ref 2.8–30.0)

## 2017-04-29 LAB — ACETAMINOPHEN LEVEL: Acetaminophen (Tylenol), Serum: <10 ug/mL — ABNORMAL LOW (ref 10–30)

## 2017-04-29 MED ORDER — QUETIAPINE FUMARATE 200 MG PO TABS
400.0000 mg | ORAL_TABLET | Freq: Every day | ORAL | Status: DC
  Administered 2017-04-29: 400 mg via ORAL
  Filled 2017-04-29: qty 2

## 2017-04-29 MED ORDER — LORAZEPAM 2 MG PO TABS
0.0000 mg | ORAL_TABLET | Freq: Four times a day (QID) | ORAL | Status: DC
  Administered 2017-04-29: 1 mg via ORAL

## 2017-04-29 MED ORDER — HYDROCHLOROTHIAZIDE 25 MG PO TABS
25.0000 mg | ORAL_TABLET | Freq: Every day | ORAL | Status: DC
  Filled 2017-04-29: qty 1

## 2017-04-29 MED ORDER — LORAZEPAM 1 MG PO TABS
ORAL_TABLET | ORAL | Status: AC
  Administered 2017-04-29: 1 mg via ORAL
  Filled 2017-04-29: qty 1

## 2017-04-29 MED ORDER — ACETAMINOPHEN 325 MG PO TABS
ORAL_TABLET | ORAL | Status: AC
  Administered 2017-04-29: 650 mg via ORAL
  Filled 2017-04-29: qty 2

## 2017-04-29 MED ORDER — CLONIDINE HCL 0.1 MG PO TABS: 0.1000 mg | ORAL_TABLET | ORAL | Status: DC | PRN

## 2017-04-29 MED ORDER — LORAZEPAM 2 MG PO TABS: 0.0000 mg | ORAL_TABLET | Freq: Two times a day (BID) | ORAL | Status: DC

## 2017-04-29 MED ORDER — LISINOPRIL-HYDROCHLOROTHIAZIDE 20-25 MG PO TABS: 1.0000 | ORAL_TABLET | Freq: Every day | ORAL | Status: DC

## 2017-04-29 MED ORDER — GABAPENTIN 600 MG PO TABS
300.0000 mg | ORAL_TABLET | Freq: Three times a day (TID) | ORAL | Status: DC
  Administered 2017-04-29: 300 mg via ORAL
  Filled 2017-04-29: qty 1

## 2017-04-29 MED ORDER — THIAMINE HCL 100 MG/ML IJ SOLN: 100.0000 mg | Freq: Every day | INTRAMUSCULAR | Status: DC

## 2017-04-29 MED ORDER — LORAZEPAM 2 MG/ML IJ SOLN: 0.0000 mg | Freq: Two times a day (BID) | INTRAMUSCULAR | Status: DC

## 2017-04-29 MED ORDER — IBUPROFEN 800 MG PO TABS
800.0000 mg | ORAL_TABLET | Freq: Three times a day (TID) | ORAL | Status: DC
Start: 2017-04-29 — End: 2017-04-30
  Administered 2017-04-29: 800 mg via ORAL
  Filled 2017-04-29 (×2): qty 1

## 2017-04-29 MED ORDER — VITAMIN B-1 100 MG PO TABS
100.0000 mg | ORAL_TABLET | Freq: Every day | ORAL | Status: DC
  Administered 2017-04-29: 100 mg via ORAL
  Filled 2017-04-29: qty 1

## 2017-04-29 MED ORDER — DULOXETINE HCL 30 MG PO CPEP
30.0000 mg | ORAL_CAPSULE | Freq: Every day | ORAL | Status: DC
  Administered 2017-04-29: 30 mg via ORAL
  Filled 2017-04-29: qty 1

## 2017-04-29 MED ORDER — ACETAMINOPHEN 325 MG PO TABS
650.0000 mg | ORAL_TABLET | Freq: Four times a day (QID) | ORAL | Status: DC | PRN
  Administered 2017-04-29: 650 mg via ORAL

## 2017-04-29 MED ORDER — LORAZEPAM 2 MG/ML IJ SOLN: 0.0000 mg | Freq: Four times a day (QID) | INTRAMUSCULAR | Status: DC

## 2017-04-29 MED ORDER — LISINOPRIL 20 MG PO TABS
20.0000 mg | ORAL_TABLET | Freq: Every day | ORAL | Status: DC
  Administered 2017-04-29: 20 mg via ORAL
  Filled 2017-04-29 (×2): qty 1

## 2017-04-29 NOTE — ED Notes (Signed)
Aluminum splint applied with wrap

## 2017-04-29 NOTE — ED Triage Notes (Signed)
States has had trouble with cocaine and opiates abuse, wishing to get help stopping. States last few days having thoughts of harming self. States does not have a plan has not done anything to harm self at this point.

## 2017-04-29 NOTE — BH Assessment (Signed)
Per patient's consent, referral for inpatient substance abuse submitted to [facility] for review.

## 2017-04-29 NOTE — ED Notes (Signed)
Pt states off her suboxone x 2 weeks b/c she started using benzos again. States her children stopped talking to her because she got back with her husband who had been arrested for domestic violence. States he is now in jail for assaulting someone else. States she is depressed and wants off the benzos and to restart her suboxone.

## 2017-04-29 NOTE — BH Assessment (Signed)
Assessment Note  Teresa Jefferson is an 51 y.o. female presenting to the ED, voluntarily for depression, suicidal ideations without plan or intent and benzo withdrawals.  Pt reports being off her suboxone for 2 weeks.  She reports feeling depressed because she has a strained relationship with her children.  She states she recently started using benzos again.  She denies having a suicide plan.  She denies auditory/visual hallucinations.  She denies currently having an outpatient mental health provider.  Diagnosis: Major Depression, Substance Use disorder  Past Medical History:  Past Medical History:  Diagnosis Date  . Bipolar 1 disorder ([place])   . Depression   . Hypertension   . Neuropathy   . Schizophrenia ([place])   . Thyroid disease     Past Surgical History:  Procedure Laterality Date  . ABDOMINAL HYSTERECTOMY    . KNEE SURGERY Right   . TONSILLECTOMY      Family History: No family history on file.  Social History:  reports that  has never smoked. she has never used smokeless tobacco. She reports that she does not drink alcohol or use drugs.  Additional Social History:  Alcohol / Drug Use Pain Medications: See PTA Prescriptions: See PTA Over the Counter: See PTA History of alcohol / drug use?: Yes Negative Consequences of Use: Personal relationships Substance #1 Name of Substance 1: Benzos  CIWA: CIWA-Ar BP: (!) 153/99 Pulse Rate: 61 COWS:    Allergies:  Allergies  Allergen Reactions  . Amoxicillin Rash    Home Medications:  (Not in a hospital admission)  OB/GYN Status:  No LMP recorded. Patient has had a hysterectomy.  General Assessment Data Location of Assessment: [facility] ED TTS Assessment: In system Is this a Tele or Face-to-Face Assessment?: Face-to-Face Is this an Initial Assessment or a Re-assessment for this encounter?: Initial Assessment Marital status: Single Maiden name: n/a Is patient pregnant?: No Pregnancy Status: No Can pt return to current  living arrangement?: Yes Admission Status: Voluntary Is patient capable of signing voluntary admission?: Yes Referral Source: Self/Family/Friend Insurance type: Medicaid  Medical Screening Exam ([place]) Medical Exam completed: Yes  Crisis Care Plan Legal Guardian: Other:(self) Name of Psychiatrist: none reported Name of Therapist: none reported  Education Status Is patient currently in school?: No Current Grade: na Highest grade of school patient has completed: hs Name of school: na Contact person: na  Risk to self with the past 6 months Suicidal Ideation: No Has patient been a risk to self within the past 6 months prior to admission? : No Suicidal Intent: No Has patient had any suicidal intent within the past 6 months prior to admission? : No Is patient at risk for suicide?: No Suicidal Plan?: No Has patient had any suicidal plan within the past 6 months prior to admission? : No Access to Means: No What has been your use of drugs/alcohol within the last 12 months?: benzos Previous Attempts/Gestures: No How many times?: 0 Other Self Harm Risks: none identified Triggers for Past Attempts: None known Intentional Self Injurious Behavior: None Family Suicide History: No Recent stressful life event(s): Financial Problems Persecutory voices/beliefs?: No Depression: Yes Depression Symptoms: Loss of interest in usual pleasures, Feeling worthless/self pity Substance abuse history and/or treatment for substance abuse?: Yes Suicide prevention information given to non-admitted patients: Not applicable  Risk to Others within the past 6 months Homicidal Ideation: No Does patient have any lifetime risk of violence toward others beyond the six months prior to admission? : No Thoughts of  Harm to Others: No Current Homicidal Intent: No Current Homicidal Plan: No Access to Homicidal Means: No Identified Victim: none identified History of harm to others?: No Assessment of  Violence: None Noted Violent Behavior Description: none identified Does patient have access to weapons?: No Criminal Charges Pending?: No Does patient have a court date: No Is patient on probation?: No  Psychosis Hallucinations: None noted Delusions: None noted  Mental Status Report Appearance/Hygiene: In scrubs Eye Contact: Good Motor Activity: Freedom of movement Speech: Logical/coherent Level of Consciousness: Alert Mood: Depressed Affect: Appropriate to circumstance, Depressed Anxiety Level: Minimal Thought Processes: Coherent, Relevant Judgement: Partial Orientation: Person, Place, Time, Situation Obsessive Compulsive Thoughts/Behaviors: None  Cognitive Functioning Concentration: Normal Memory: Recent Intact, Remote Intact IQ: Average Insight: Fair Impulse Control: Fair Appetite: Fair Weight Loss: 0 Weight Gain: 0 Sleep: Decreased Vegetative Symptoms: None  ADLScreening [facility] Assessment Services) Patient's cognitive ability adequate to safely complete daily activities?: Yes Patient able to express need for assistance with ADLs?: Yes Independently performs ADLs?: Yes (appropriate for developmental age)  Prior Inpatient Therapy Prior Inpatient Therapy: No Prior Therapy Dates: na Prior Therapy Facilty/Provider(s): na Reason for Treatment: na  Prior Outpatient Therapy Prior Outpatient Therapy: No Prior Therapy Dates: na Prior Therapy Facilty/Provider(s): na Reason for Treatment: nna Does patient have an ACCT team?: No Does patient have Intensive In-House Services?  : No Does patient have [facility] services? : No Does patient have P4CC services?: No  ADL Screening (condition at time of admission) Patient's cognitive ability adequate to safely complete daily activities?: Yes Patient able to express need for assistance with ADLs?: Yes Independently performs ADLs?: Yes (appropriate for developmental age)       Abuse/Neglect Assessment (Assessment to be  complete while patient is alone) Abuse/Neglect Assessment Can Be Completed: Yes Physical Abuse: Denies Verbal Abuse: Denies Sexual Abuse: Denies Exploitation of patient/patient's resources: Denies Self-Neglect: Denies Values / Beliefs Cultural Requests During Hospitalization: None Spiritual Requests During Hospitalization: None Consults Spiritual Care Consult Needed: No Social Work Consult Needed: No Regulatory affairs officer (For Healthcare) Does Patient Have a Medical Advance Directive?: No Would patient like information on creating a medical advance directive?: No - Patient declined    Additional Information 1:1 In Past 12 Months?: No CIRT Risk: No Elopement Risk: No Does patient have medical clearance?: Yes     Disposition:  Disposition Initial Assessment Completed for this Encounter: Yes Disposition of Patient: Referred to, Inpatient treatment program([facility])  On Site Evaluation by:   Reviewed with Physician:    Oneita Hurt 04/29/2017 9:11 PM

## 2017-04-29 NOTE — BH Assessment (Signed)
Referral submitted to [facility]).

## 2017-04-29 NOTE — ED Notes (Signed)
Pt is alert and oriented this evening. Pt mood is depressed and affect is sad. Pt denies SI/HI and AVH at this time and she is taking medications as prescribed, although she did refuse HCTZ tonight due to enuresis. Writer explained tx plan and provided nutrition. 15 minute checks are ongoing for safety.

## 2017-04-29 NOTE — ED Provider Notes (Addendum)
[facility] Emergency Department Provider Note  ____________________________________________   I have reviewed the triage vital signs and the nursing notes. Where available I have reviewed prior notes and, if possible and indicated, outside hospital notes.    HISTORY  Chief Complaint Suicidal and Addiction Problem    HPI Teresa Jefferson is a 51 y.o. female who presents today with an addiction issues she was on Suboxone but no longer can take it because she was taking benzos she does have a history of benzo abuse as well.  She states that she was feeling depressed.  She has no history of suicidal attempt she states that she has not try to kill herself this time.  She states that she does not feel that she is suicidal at this moment but she does feel depressed.  Patient apparently also fell a week ago and her left index finger has been hurting and is in some degree of discomfort.  No other injury from that fall and again it happened a week ago.  She is having trouble bending the pain at the DIP.  A non-syncopal fall she tripped over carpet she states.  She apparently made some suicidal comments on triage.  Past Medical History:  Diagnosis Date  . Bipolar 1 disorder ([place])   . Depression   . Hypertension   . Neuropathy   . Schizophrenia ([place])   . Thyroid disease     There are no active problems to display for this patient.   Past Surgical History:  Procedure Laterality Date  . ABDOMINAL HYSTERECTOMY    . KNEE SURGERY Right   . TONSILLECTOMY      Prior to Admission medications   Medication Sig Start Date End Date Taking? Authorizing Provider  benzonatate (TESSALON PERLES) 100 MG capsule Take 1 capsule (100 mg total) by mouth every 6 (six) hours as needed for cough. 11/07/16 11/07/17  Harvest Dark, MD  levothyroxine (SYNTHROID, LEVOTHROID) 125 MCG tablet Take 125 mcg by mouth daily before breakfast.    [provider]   lisinopril-hydrochlorothiazide (PRINZIDE,ZESTORETIC) 20-25 MG tablet Take 1 tablet by mouth daily.    [provider]  QUEtiapine (SEROQUEL) 400 MG tablet Take 1 tablet (400 mg total) by mouth at bedtime. 11/09/16   Darel Hong, MD  traMADol (ULTRAM) 50 MG tablet Take 1 tablet (50 mg total) by mouth every 6 (six) hours as needed. 01/30/16   Daymon Larsen, MD    Allergies Amoxicillin  No family history on file.  Social History Social History   Tobacco Use  . Smoking status: Never Smoker  . Smokeless tobacco: Never Used  Substance Use Topics  . Alcohol use: No  . Drug use: No    Review of Systems Constitutional: No fever/chills Eyes: No visual changes. ENT: No sore throat. No stiff neck no neck pain Cardiovascular: Denies chest pain. Respiratory: Denies shortness of breath. Gastrointestinal:   no vomiting.  No diarrhea.  No constipation. Genitourinary: Negative for dysuria. Musculoskeletal: Negative lower extremity swelling Skin: Negative for rash. Neurological: Negative for severe headaches, focal weakness or numbness.   ____________________________________________   PHYSICAL EXAM:  VITAL SIGNS: ED Triage Vitals  Enc Vitals Group     BP 04/29/17 1657 (!) 153/99     Pulse Rate 04/29/17 1657 61     Resp 04/29/17 1657 (!) 2     Temp 04/29/17 1657 98.2 F (36.8 C)     Temp Source 04/29/17 1657 Oral     SpO2 04/29/17  1657 98 %     Weight 04/29/17 1659 170 lb (77.1 kg)     Height 04/29/17 1659 5\' 2"  (1.575 m)     Head Circumference --      Peak Flow --      Pain Score 04/29/17 1657 8     Pain Loc --      Pain Edu? --      Excl. in [place]? --     Constitutional: Alert and oriented. Well appearing and in no acute distress.  She does Eyes: Conjunctivae are normal Head: Atraumatic HEENT: No congestion/rhinnorhea. Mucous membranes are moist.  Oropharynx non-erythematous Neck:   Nontender with no meningismus, no masses, no stridor Cardiovascular: Normal  rate, regular rhythm. Grossly normal heart sounds.  Good peripheral circulation. Respiratory: Normal respiratory effort.  No retractions. Lungs CTAB. Abdominal: Soft and nontender. No distention. No guarding no rebound Back:  There is no focal tenderness or step off.  there is no midline tenderness there are no lesions noted. there is no CVA tenderness Musculoskeletal: No lower extremity tenderness, patient's finger is held in extension, she can flex it to some extent at the MCP and PIP but not at the DIP, there is some degree of mild head deformity noted.  Good sensation intact pulses and cap refill, there is tenderness to palpation over the DIP no joint effusions, no DVT signs strong distal pulses no edema Neurologic:  Normal speech and language. No gross focal neurologic deficits are appreciated.  Skin:  Skin is warm, dry and intact. No rash noted. Psychiatric: Mood and affect are anxious. Speech and behavior are normal.  ____________________________________________   LABS (all labs ordered are listed, but only abnormal results are displayed)  Labs Reviewed  COMPREHENSIVE METABOLIC PANEL - Abnormal; Notable for the following components:      Result Value   Potassium 3.4 (*)    Glucose, Bld 121 (*)    BUN 22 (*)    Creatinine, Ser 1.20 (*)    GFR calc non Af Amer 51 (*)    GFR calc Af Amer 60 (*)    All other components within normal limits  ACETAMINOPHEN LEVEL - Abnormal; Notable for the following components:   Acetaminophen (Tylenol), Serum <10 (*)    All other components within normal limits  CBC - Abnormal; Notable for the following components:   RDW 14.7 (*)    All other components within normal limits  URINE DRUG SCREEN, QUALITATIVE ([facility] ONLY) - Abnormal; Notable for the following components:   Tricyclic, Ur Screen POSITIVE (*)    Cocaine Metabolite,Ur [place] POSITIVE (*)    Benzodiazepine, Ur Scrn POSITIVE (*)    All other components within normal limits  ETHANOL  SALICYLATE  LEVEL    Pertinent labs  results that were available during my care of the patient were reviewed by me and considered in my medical decision making (see chart for details). ____________________________________________  EKG  I personally interpreted any EKGs ordered by me or triage  ____________________________________________  RADIOLOGY  Pertinent labs & imaging results that were available during my care of the patient were reviewed by me and considered in my medical decision making (see chart for details). If possible, patient and/or family made aware of any abnormal findings.  Dg Finger Index Left  Result Date: 04/29/2017 CLINICAL DATA:  Fall 1 week ago.  Left index finger swelling, pain EXAM: LEFT INDEX FINGER 2+V COMPARISON:  01/21/2008 FINDINGS: There is a fracture along the posterior aspect of the  base of the distal phalanx. No subluxation or dislocation. Soft tissues are intact. IMPRESSION: Fracture at the posterior base left index finger distal phalanx. Electronically Signed   By: Rolm Baptise M.D.   On: 04/29/2017 17:41   ____________________________________________    PROCEDURES  Procedure(s) performed: None  Procedures  Critical Care performed: None  ____________________________________________   INITIAL IMPRESSION / ASSESSMENT AND PLAN / ED COURSE  Pertinent labs & imaging results that were available during my care of the patient were reviewed by me and considered in my medical decision making (see chart for details).  Here with 2 issues the first is she is depressed and is dealing with substance abuse issues we will have psychiatry evaluate her for that.  At this time, she is somewhat equivocal about her intentions in this regard, she also has what is obviously a injury to her finger that is somewhat chronic is not clear if it was a week ago or longer but certainly did not happen today or in the last few days.  X-ray does show a DIP fracture we will place this in  a splint, patient will require outpatient follow-up for this.  Likely also ligamentous injury but again not acute today.  ----------------------------------------- 8:25 PM on 04/29/2017 -----------------------------------------  She does not feel the patient requires IVC she does not present as an acute danger to self or others, we will therefore not keep her on her IVC at this time she is voluntary, we will watch her for signs of withdrawal and will have TTS talk to her about substance abuse counseling which seems to be her largest issue.  She was somewhat elevated baseline blood pressure appears to be in the 170s-180s, will not try to aggressively change this we will restart her home blood pressure medications which she is sometimes compliant with.  ----------------------------------------- 10:57 PM on 04/29/2017 -----------------------------------------  Signed out at the end of my shift    ____________________________________________   FINAL CLINICAL IMPRESSION(S) / ED DIAGNOSES  Final diagnoses:  None      This chart was dictated using voice recognition software.  Despite best efforts to proofread,  errors can occur which can change meaning.      Schuyler Amor, MD 04/29/17 1954    Schuyler Amor, MD 04/29/17 2025    Schuyler Amor, MD 04/29/17 2215    Schuyler Amor, MD 04/29/17 [phone]

## 2017-04-30 NOTE — ED Provider Notes (Signed)
-----------------------------------------   6:24 AM on 04/30/2017 -----------------------------------------   Blood pressure (!) 183/100, pulse (!) 53, temperature 97.8 F (36.6 C), resp. rate 18, height 5\' 2"  (1.575 m), weight 77.1 kg (170 lb), SpO2 97 %.  The patient had no acute events since last update.  Calm and cooperative at this time.  Disposition is pending Psychiatry/Behavioral Medicine team recommendations.  Discussed with Randall Hiss (RN), reports patient is contracted for safety and no longer needing one-to-one, being actively monitored in the Oda Cogan, MD 04/30/17 [phone]

## 2017-04-30 NOTE — ED Notes (Signed)
Report called and given to [place], RN @ [facility] [phone]

## 2017-04-30 NOTE — ED Notes (Signed)
EMTALA reviewed. 

## 2017-04-30 NOTE — ED Notes (Signed)
Pt d/c to another facility. All belongings transferred with patient. Pt denies pain/ah/vh/si/hi at this time. No other issues voiced/reported prior to transfer.

## 2017-04-30 NOTE — ED Notes (Signed)
Patient denies pain and is resting comfortably.  

## 2017-04-30 NOTE — BH Assessment (Signed)
Call received from [place], [place] with [facility].  Patient has been accepted for detotx admission. Accepting MD:  Dr. Marylou Flesher Call Report:  [phone] Patient can arrive at any time to:  [facility] [address] [place], [place]  [address]

## 2017-04-30 NOTE — ED Notes (Signed)
Report called and given to [place], [place]

## 2017-11-16 ENCOUNTER — Inpatient Hospital Stay
Admission: AD | Admit: 2017-11-16 | Discharge: 2017-12-06 | DRG: 885 | Disposition: A | Discharge status: Home / Self Care | Payer: Medicaid Other | Source: Intra-hospital | Attending: Psychiatry | Admitting: Psychiatry

## 2017-11-16 ENCOUNTER — Emergency Department
Admission: EM | Admit: 2017-11-16 | Discharge: 2017-11-16 | Disposition: A | Discharge status: Other Acute Inpatient Hospital | Payer: Medicaid Other | Attending: Emergency Medicine | Admitting: Emergency Medicine

## 2017-11-16 ENCOUNTER — Other Ambulatory Visit: Payer: Self-pay

## 2017-11-16 DIAGNOSIS — Z79899 Other long term (current) drug therapy: Secondary | ICD-10-CM

## 2017-11-16 DIAGNOSIS — Z560 Unemployment, unspecified: Secondary | ICD-10-CM | POA: Diagnosis not present

## 2017-11-16 DIAGNOSIS — F1994 Other psychoactive substance use, unspecified with psychoactive substance-induced mood disorder: Secondary | ICD-10-CM | POA: Diagnosis not present

## 2017-11-16 DIAGNOSIS — F1120 Opioid dependence, uncomplicated: Secondary | ICD-10-CM | POA: Diagnosis present

## 2017-11-16 DIAGNOSIS — F319 Bipolar disorder, unspecified: Secondary | ICD-10-CM | POA: Insufficient documentation

## 2017-11-16 DIAGNOSIS — Z5181 Encounter for therapeutic drug level monitoring: Secondary | ICD-10-CM | POA: Diagnosis not present

## 2017-11-16 DIAGNOSIS — F333 Major depressive disorder, recurrent, severe with psychotic symptoms: Secondary | ICD-10-CM | POA: Diagnosis not present

## 2017-11-16 DIAGNOSIS — F419 Anxiety disorder, unspecified: Secondary | ICD-10-CM | POA: Diagnosis present

## 2017-11-16 DIAGNOSIS — E039 Hypothyroidism, unspecified: Secondary | ICD-10-CM | POA: Diagnosis present

## 2017-11-16 DIAGNOSIS — G8929 Other chronic pain: Secondary | ICD-10-CM | POA: Diagnosis present

## 2017-11-16 DIAGNOSIS — D120 Benign neoplasm of cecum: Secondary | ICD-10-CM | POA: Diagnosis present

## 2017-11-16 DIAGNOSIS — J45909 Unspecified asthma, uncomplicated: Secondary | ICD-10-CM | POA: Diagnosis present

## 2017-11-16 DIAGNOSIS — Z888 Allergy status to other drugs, medicaments and biological substances status: Secondary | ICD-10-CM | POA: Diagnosis not present

## 2017-11-16 DIAGNOSIS — X58XXXA Exposure to other specified factors, initial encounter: Secondary | ICD-10-CM | POA: Diagnosis not present

## 2017-11-16 DIAGNOSIS — R45851 Suicidal ideations: Secondary | ICD-10-CM | POA: Insufficient documentation

## 2017-11-16 DIAGNOSIS — F3289 Other specified depressive episodes: Secondary | ICD-10-CM

## 2017-11-16 DIAGNOSIS — I10 Essential (primary) hypertension: Secondary | ICD-10-CM | POA: Diagnosis present

## 2017-11-16 DIAGNOSIS — R1110 Vomiting, unspecified: Secondary | ICD-10-CM | POA: Diagnosis not present

## 2017-11-16 DIAGNOSIS — Z79890 Hormone replacement therapy (postmenopausal): Secondary | ICD-10-CM

## 2017-11-16 DIAGNOSIS — Z90710 Acquired absence of both cervix and uterus: Secondary | ICD-10-CM | POA: Diagnosis not present

## 2017-11-16 DIAGNOSIS — B001 Herpesviral vesicular dermatitis: Secondary | ICD-10-CM | POA: Diagnosis present

## 2017-11-16 DIAGNOSIS — F1420 Cocaine dependence, uncomplicated: Secondary | ICD-10-CM | POA: Diagnosis present

## 2017-11-16 DIAGNOSIS — K648 Other hemorrhoids: Secondary | ICD-10-CM | POA: Diagnosis present

## 2017-11-16 DIAGNOSIS — L209 Atopic dermatitis, unspecified: Secondary | ICD-10-CM | POA: Diagnosis not present

## 2017-11-16 DIAGNOSIS — K5900 Constipation, unspecified: Secondary | ICD-10-CM

## 2017-11-16 DIAGNOSIS — Y92230 Patient room in hospital as the place of occurrence of the external cause: Secondary | ICD-10-CM | POA: Diagnosis not present

## 2017-11-16 DIAGNOSIS — G629 Polyneuropathy, unspecified: Secondary | ICD-10-CM | POA: Diagnosis present

## 2017-11-16 DIAGNOSIS — Z5309 Procedure and treatment not carried out because of other contraindication: Secondary | ICD-10-CM | POA: Diagnosis not present

## 2017-11-16 DIAGNOSIS — Z9119 Patient's noncompliance with other medical treatment and regimen: Secondary | ICD-10-CM

## 2017-11-16 DIAGNOSIS — Z91411 Personal history of adult psychological abuse: Secondary | ICD-10-CM

## 2017-11-16 DIAGNOSIS — F1410 Cocaine abuse, uncomplicated: Secondary | ICD-10-CM

## 2017-11-16 DIAGNOSIS — R51 Headache: Secondary | ICD-10-CM | POA: Diagnosis not present

## 2017-11-16 DIAGNOSIS — E876 Hypokalemia: Secondary | ICD-10-CM | POA: Diagnosis present

## 2017-11-16 DIAGNOSIS — R42 Dizziness and giddiness: Secondary | ICD-10-CM | POA: Diagnosis not present

## 2017-11-16 DIAGNOSIS — H6121 Impacted cerumen, right ear: Secondary | ICD-10-CM | POA: Diagnosis present

## 2017-11-16 DIAGNOSIS — Z85038 Personal history of other malignant neoplasm of large intestine: Secondary | ICD-10-CM

## 2017-11-16 DIAGNOSIS — F332 Major depressive disorder, recurrent severe without psychotic features: Secondary | ICD-10-CM

## 2017-11-16 DIAGNOSIS — Z590 Homelessness: Secondary | ICD-10-CM | POA: Diagnosis not present

## 2017-11-16 DIAGNOSIS — Z881 Allergy status to other antibiotic agents status: Secondary | ICD-10-CM | POA: Diagnosis not present

## 2017-11-16 DIAGNOSIS — Z91410 Personal history of adult physical and sexual abuse: Secondary | ICD-10-CM

## 2017-11-16 DIAGNOSIS — F1110 Opioid abuse, uncomplicated: Secondary | ICD-10-CM

## 2017-11-16 DIAGNOSIS — H9202 Otalgia, left ear: Secondary | ICD-10-CM | POA: Diagnosis not present

## 2017-11-16 DIAGNOSIS — S90414A Abrasion, right lesser toe(s), initial encounter: Secondary | ICD-10-CM | POA: Diagnosis not present

## 2017-11-16 DIAGNOSIS — R1012 Left upper quadrant pain: Secondary | ICD-10-CM | POA: Diagnosis not present

## 2017-11-16 DIAGNOSIS — F329 Major depressive disorder, single episode, unspecified: Secondary | ICD-10-CM | POA: Insufficient documentation

## 2017-11-16 DIAGNOSIS — F209 Schizophrenia, unspecified: Secondary | ICD-10-CM | POA: Insufficient documentation

## 2017-11-16 DIAGNOSIS — M542 Cervicalgia: Secondary | ICD-10-CM | POA: Diagnosis present

## 2017-11-16 DIAGNOSIS — K625 Hemorrhage of anus and rectum: Secondary | ICD-10-CM | POA: Diagnosis not present

## 2017-11-16 DIAGNOSIS — H5789 Other specified disorders of eye and adnexa: Secondary | ICD-10-CM | POA: Diagnosis not present

## 2017-11-16 DIAGNOSIS — K5909 Other constipation: Secondary | ICD-10-CM | POA: Diagnosis present

## 2017-11-16 DIAGNOSIS — R195 Other fecal abnormalities: Secondary | ICD-10-CM | POA: Diagnosis not present

## 2017-11-16 LAB — COMPREHENSIVE METABOLIC PANEL
ALT: 15 U/L (ref 0–44)
AST: 25 U/L (ref 15–41)
Albumin: 4 g/dL (ref 3.5–5.0)
Alkaline Phosphatase: 62 U/L (ref 38–126)
Anion gap: 8 (ref 5–15)
BUN: 20 mg/dL (ref 6–20)
CHLORIDE: 110 mmol/L (ref 98–111)
CO2: 24 mmol/L (ref 22–32)
Calcium: 9 mg/dL (ref 8.9–10.3)
Creatinine, Ser: 0.75 mg/dL (ref 0.44–1.00)
Glucose, Bld: 170 mg/dL — ABNORMAL HIGH (ref 70–99)
POTASSIUM: 2.7 mmol/L — AB (ref 3.5–5.1)
SODIUM: 142 mmol/L (ref 135–145)
Total Bilirubin: 0.6 mg/dL (ref 0.3–1.2)
Total Protein: 7.3 g/dL (ref 6.5–8.1)

## 2017-11-16 LAB — URINE DRUG SCREEN, QUALITATIVE (ARMC ONLY)
AMPHETAMINES, UR SCREEN: NOT DETECTED
Benzodiazepine, Ur Scrn: NOT DETECTED
CANNABINOID 50 NG, UR ~~LOC~~: NOT DETECTED
Cocaine Metabolite,Ur ~~LOC~~: POSITIVE — AB
MDMA (ECSTASY) UR SCREEN: NOT DETECTED
Methadone Scn, Ur: NOT DETECTED
Opiate, Ur Screen: NOT DETECTED
Phencyclidine (PCP) Ur S: NOT DETECTED
TRICYCLIC, UR SCREEN: POSITIVE — AB

## 2017-11-16 LAB — ACETAMINOPHEN LEVEL

## 2017-11-16 LAB — CBC
HCT: 38.7 % (ref 35.0–47.0)
Hemoglobin: 12.9 g/dL (ref 12.0–16.0)
MCH: 27.8 pg (ref 26.0–34.0)
MCHC: 33.3 g/dL (ref 32.0–36.0)
MCV: 83.4 fL (ref 80.0–100.0)
PLATELETS: 209 10*3/uL (ref 150–440)
RBC: 4.64 MIL/uL (ref 3.80–5.20)
RDW: 14.4 % (ref 11.5–14.5)
WBC: 4.3 10*3/uL (ref 3.6–11.0)

## 2017-11-16 LAB — SALICYLATE LEVEL

## 2017-11-16 LAB — ETHANOL

## 2017-11-16 MED ORDER — MAGNESIUM HYDROXIDE 400 MG/5ML PO SUSP
30.0000 mL | Freq: Every day | ORAL | Status: DC | PRN
  Administered 2017-11-19 – 2017-12-01 (×3): 30 mL via ORAL
  Filled 2017-11-16 (×3): qty 30

## 2017-11-16 MED ORDER — TRAZODONE HCL 100 MG PO TABS: 100.0000 mg | ORAL_TABLET | Freq: Every evening | ORAL | Status: DC | PRN

## 2017-11-16 MED ORDER — QUETIAPINE FUMARATE 200 MG PO TABS
400.0000 mg | ORAL_TABLET | Freq: Every day | ORAL | Status: DC
  Administered 2017-11-16 – 2017-12-03 (×18): 400 mg via ORAL
  Filled 2017-11-16 (×18): qty 2

## 2017-11-16 MED ORDER — BUPRENORPHINE HCL-NALOXONE HCL 8-2 MG SL SUBL
1.0000 | SUBLINGUAL_TABLET | Freq: Two times a day (BID) | SUBLINGUAL | Status: DC
  Administered 2017-11-16 – 2017-11-20 (×8): 1 via SUBLINGUAL
  Filled 2017-11-16 (×8): qty 1

## 2017-11-16 MED ORDER — POTASSIUM CHLORIDE CRYS ER 20 MEQ PO TBCR
20.0000 meq | EXTENDED_RELEASE_TABLET | Freq: Two times a day (BID) | ORAL | Status: DC
  Administered 2017-11-16 – 2017-11-18 (×5): 20 meq via ORAL
  Filled 2017-11-16 (×5): qty 1

## 2017-11-16 MED ORDER — ALUM & MAG HYDROXIDE-SIMETH 200-200-20 MG/5ML PO SUSP
30.0000 mL | ORAL | Status: DC | PRN
  Administered 2017-12-01: 30 mL via ORAL
  Filled 2017-11-16: qty 30

## 2017-11-16 MED ORDER — POTASSIUM CHLORIDE CRYS ER 20 MEQ PO TBCR
20.0000 meq | EXTENDED_RELEASE_TABLET | Freq: Two times a day (BID) | ORAL | Status: DC
  Administered 2017-11-16: 20 meq via ORAL
  Filled 2017-11-16: qty 1

## 2017-11-16 MED ORDER — HYDROXYZINE HCL 50 MG PO TABS
50.0000 mg | ORAL_TABLET | Freq: Three times a day (TID) | ORAL | Status: DC | PRN
  Administered 2017-11-21 – 2017-11-27 (×5): 50 mg via ORAL
  Filled 2017-11-16 (×5): qty 1

## 2017-11-16 MED ORDER — IBUPROFEN 600 MG PO TABS
800.0000 mg | ORAL_TABLET | Freq: Two times a day (BID) | ORAL | Status: DC | PRN
  Administered 2017-11-16: 800 mg via ORAL
  Filled 2017-11-16 (×2): qty 1

## 2017-11-16 MED ORDER — GABAPENTIN 300 MG PO CAPS
300.0000 mg | ORAL_CAPSULE | Freq: Three times a day (TID) | ORAL | Status: DC
  Administered 2017-11-16: 300 mg via ORAL
  Filled 2017-11-16 (×2): qty 1

## 2017-11-16 MED ORDER — QUETIAPINE FUMARATE 200 MG PO TABS: 400.0000 mg | ORAL_TABLET | Freq: Every day | ORAL | Status: DC

## 2017-11-16 MED ORDER — IBUPROFEN 800 MG PO TABS: 800.0000 mg | ORAL_TABLET | Freq: Two times a day (BID) | ORAL | Status: DC | PRN

## 2017-11-16 MED ORDER — GABAPENTIN 300 MG PO CAPS
300.0000 mg | ORAL_CAPSULE | Freq: Three times a day (TID) | ORAL | Status: DC
  Administered 2017-11-16 – 2017-11-19 (×9): 300 mg via ORAL
  Filled 2017-11-16 (×9): qty 1

## 2017-11-16 MED ORDER — ACETAMINOPHEN 325 MG PO TABS
650.0000 mg | ORAL_TABLET | Freq: Four times a day (QID) | ORAL | Status: DC | PRN
  Administered 2017-11-17 – 2017-12-02 (×9): 650 mg via ORAL
  Filled 2017-11-16 (×9): qty 2

## 2017-11-16 MED ORDER — BUPRENORPHINE HCL-NALOXONE HCL 8-2 MG SL SUBL
1.0000 | SUBLINGUAL_TABLET | Freq: Two times a day (BID) | SUBLINGUAL | Status: DC
  Administered 2017-11-16: 1 via SUBLINGUAL
  Filled 2017-11-16: qty 1

## 2017-11-16 NOTE — Progress Notes (Signed)
D: Received patient from ED. Patient skin assessment completed with Demetria RN, skin is intact, multiple healed tattoos no contraband found. Patient is a low fall risk. Patient denies SI/HI/AVH. Patient is admitted for depression and thoughts of suicide.  A: Patient oriented to unit/room/call light. Patient was offered support and encouragement. Patient was encourage to attend groups, participate in unit activities and continue with plan of care. Q x 15 minute observation checks were completed for safety.   R: Patient has no complaints at this time. Patient is receptive to treatment and safety maintained on unit.

## 2017-11-16 NOTE — BH Assessment (Signed)
Assessment Note  Teresa Jefferson is an 52 y.o. female. Patient presents to [facility] voluntarily due to suicidal ideation. Patient states she has been having suicidal thoughts for the past week. Patient endorses depression due to conflicts with her estranged husband and homelessness. Patient denies previous SUA. Patient endorses hearing voice and seeing images of people. Patient denies HI.  Patient endorses crack and opiate use daily.   Patient endorses previous inpatient psychiatric hospitalizations at [facility] for depression. Patient denies outpatient mental health providers.   Patient doesn't currently have any involvement in the legal system.  Patient presented oriented x 4 with a flat affect.   Diagnosis: Depression  Past Medical History:  Past Medical History:  Diagnosis Date  . Bipolar 1 disorder ([place])   . Depression   . Hypertension   . Neuropathy   . Schizophrenia ([place])   . Thyroid disease     Past Surgical History:  Procedure Laterality Date  . ABDOMINAL HYSTERECTOMY    . KNEE SURGERY Right   . TONSILLECTOMY      Family History: No family history on file.  Social History:  reports that she has never smoked. She has never used smokeless tobacco. She reports that she has current or past drug history. She reports that she does not drink alcohol.  Additional Social History:  Alcohol / Drug Use Pain Medications: SEE PTA  Prescriptions: SEE PTA  Over the Counter: SEE PTA  History of alcohol / drug use?: Yes Longest period of sobriety (when/how long): 1.5 months  Negative Consequences of Use: Personal relationships, Work / Youth worker, Museum/gallery curator Substance #1 Name of Substance 1: Opiates  1 - Age of First Use: 52 years old  1 - Amount (size/oz): "a lot"  1 - Frequency: daily  1 - Duration: ongoing  1 - Last Use / Amount: 11/15/2017 Substance #2 Name of Substance 2: Crack 2 - Age of First Use: 52 years old  2 - Amount (size/oz): "a lot"  2 - Frequency:  daily  2 - Duration: ongoing  2 - Last Use / Amount: 11/15/2017  CIWA: CIWA-Ar BP: (!) 145/98 Pulse Rate: 86 COWS:    Allergies:  Allergies  Allergen Reactions  . Amoxicillin Rash    Home Medications:  (Not in a hospital admission)  OB/GYN Status:  No LMP recorded. Patient has had a hysterectomy.  General Assessment Data Assessment unable to be completed: (assessment completed) Location of Assessment: [facility] ED TTS Assessment: In system Is this a Tele or Face-to-Face Assessment?: Face-to-Face Is this an Initial Assessment or a Re-assessment for this encounter?: Initial Assessment Marital status: Separated Maiden name: Huston Foley Is patient pregnant?: No Pregnancy Status: No Living Arrangements: Other (Comment)(Homeless) Can pt return to current living arrangement?: Yes Admission Status: Voluntary Is patient capable of signing voluntary admission?: Yes Referral Source: Self/Family/Friend Insurance type: Medicaid  Medical Screening Exam (Max) Medical Exam completed: Yes  Crisis Care Plan Living Arrangements: Other (Comment)(Homeless) Legal Guardian: Other:(None reported) Name of Psychiatrist: None reported Name of Therapist: None reportred  Education Status Is patient currently in school?: No Is the patient employed, unemployed or receiving disability?: Unemployed  Risk to self with the past 6 months Suicidal Ideation: Yes-Currently Present Has patient been a risk to self within the past 6 months prior to admission? : No Suicidal Intent: No Has patient had any suicidal intent within the past 6 months prior to admission? : No Is patient at risk for suicide?: Yes Suicidal Plan?: No Has patient  had any suicidal plan within the past 6 months prior to admission? : No Specify Current Suicidal Plan: none reported Access to Means: Yes Specify Access to Suicidal Means: drugs What has been your use of drugs/alcohol within the last 12 months?: Crack,  Opiates Previous Attempts/Gestures: No How many times?: 0 Other Self Harm Risks: Active drug use Triggers for Past Attempts: Unpredictable Intentional Self Injurious Behavior: None Family Suicide History: No Recent stressful life event(s): Loss (Comment), Divorce(Homelessness) Persecutory voices/beliefs?: No Depression: Yes Depression Symptoms: Feeling worthless/self pity Substance abuse history and/or treatment for substance abuse?: Yes Suicide prevention information given to non-admitted patients: Not applicable  Risk to Others within the past 6 months Homicidal Ideation: No Does patient have any lifetime risk of violence toward others beyond the six months prior to admission? : No Thoughts of Harm to Others: No Current Homicidal Intent: No Current Homicidal Plan: No Access to Homicidal Means: No Identified Victim: None reported History of harm to others?: No Assessment of Violence: None Noted Violent Behavior Description: None reported Does patient have access to weapons?: No Criminal Charges Pending?: No Does patient have a court date: No Is patient on probation?: No  Psychosis Hallucinations: Auditory, Visual Delusions: None noted  Mental Status Report Appearance/Hygiene: In scrubs Eye Contact: Fair Motor Activity: Unremarkable Speech: Soft Level of Consciousness: Alert Mood: Depressed Affect: Blunted Anxiety Level: None Thought Processes: Circumstantial Judgement: Impaired Orientation: Person, Place, Time, Situation Obsessive Compulsive Thoughts/Behaviors: None  Cognitive Functioning Concentration: Fair Memory: Recent Intact, Remote Intact Is patient IDD: No Is patient DD?: No Insight: Fair Impulse Control: Poor Appetite: Poor Have you had any weight changes? : Loss Amount of the weight change? (lbs): 15 lbs Sleep: Decreased Total Hours of Sleep: 4 Vegetative Symptoms: None  ADLScreening [facility] Assessment Services) Patient's cognitive ability  adequate to safely complete daily activities?: Yes Patient able to express need for assistance with ADLs?: Yes Independently performs ADLs?: Yes (appropriate for developmental age)  Prior Inpatient Therapy Prior Inpatient Therapy: Yes Prior Therapy Dates: 2012 Prior Therapy Facilty/Provider(s): [facility] Reason for Treatment: Depression  Prior Outpatient Therapy Prior Outpatient Therapy: No Does patient have an ACCT team?: No Does patient have Intensive In-House Services?  : No Does patient have [facility] services? : No Does patient have P4CC services?: No  ADL Screening (condition at time of admission) Patient's cognitive ability adequate to safely complete daily activities?: Yes Patient able to express need for assistance with ADLs?: Yes Independently performs ADLs?: Yes (appropriate for developmental age)                        Disposition:  Disposition Initial Assessment Completed for this Encounter: Yes Patient referred to: Other (Comment)([facility])  On Site Evaluation by:   Reviewed with Physician:    Roselee Culver , [place], LCAS-A 11/16/2017 1:12 PM

## 2017-11-16 NOTE — Tx Team (Signed)
Initial Treatment Plan 11/16/2017 6:08 PM Teresa Jefferson [id number]    PATIENT STRESSORS: Financial difficulties Health problems Legal issue Medication change or noncompliance Occupational concerns Substance abuse   PATIENT STRENGTHS: Ability for insight Communication skills Motivation for treatment/growth   PATIENT IDENTIFIED PROBLEMS: Ineffective coping skills 11-16-17  Suicidal Ideation 11-16-17                   DISCHARGE CRITERIA:  Ability to meet basic life and health needs Adequate post-discharge living arrangements Safe-care adequate arrangements made  PRELIMINARY DISCHARGE PLAN: Attend aftercare/continuing care group Attend 12-step recovery group Outpatient therapy Placement in alternative living arrangements  PATIENT/FAMILY INVOLVEMENT: This treatment plan has been presented to and reviewed with the patient, Teresa Jefferson.  The patient and family have been given the opportunity to ask questions and make suggestions.  Anson [place], RN 11/16/2017, 6:08 PM

## 2017-11-16 NOTE — Consult Note (Signed)
[facility] Psychiatry Consult   Reason for Consult: Consult for 52 year old woman comes to the emergency room complaining of suicidal thoughts Referring Physician: Siadecki Patient Identification: Teresa Jefferson MRN:  [id number] Principal Diagnosis: Severe recurrent major depression without psychotic features [facility]) Diagnosis:   Patient Active Problem List   Diagnosis Date Noted  . Severe recurrent major depression without psychotic features ([place]) [F33.2] 11/16/2017  . Opiate abuse, continuous ([place]) [F11.10] 11/16/2017  . Cocaine abuse ([place]) [F14.10] 11/16/2017  . Suicidal ideation [R45.851] 11/16/2017  . Chronic pain [G89.29] 11/16/2017    Total Time spent with patient: 1 hour  Subjective:   Teresa Jefferson is a 52 y.o. female patient admitted with "I been very depressed".  HPI: Patient seen chart reviewed.  52 year old woman comes voluntarily stating she is been extremely depressed for about 2 months.  Mood is been getting gradually worse.  Feels down negative and hopeless all the time.  Sleep is poor at night.  Appetite and eating habits have been poor.  Patient has been using crack cocaine and narcotics pretty regularly especially been using Percocet for the last several days.  She says that she relapsed into using drugs because of the bad influence of a man she was living with.  Now she is homeless with little or no support.  Used to take Suboxone but says that she has been off it for "a little while" although the controlled substance database would suggest she has been off it for about 4 months at least.  Denies any hallucinations or psychotic symptoms.  Not currently getting any outpatient psychiatric treatment.  Social history: She says that she is homeless and just stays "here and there".  Closest relatives are several sisters but she has little contact with him.  Medical history: Patient complains of chronic "neuropathy" which is reportedly due to back problems.  Does not  seem to have other specific medical issues  Substance abuse history: Long-standing problem with abuse of narcotics and cocaine.  Occasional alcohol abuse.  No history of DTs.  She says that she has been able to stay sober for only a couple months at a time but was getting Suboxone last year.  Past Psychiatric History: Patient has had rehab admissions in the past but no psychiatric hospitalization.  Denies any actual suicide attempts in the past.  No history of violence.  Medications in the past included Seroquel and Neurontin.  Risk to Self: Suicidal Ideation: Yes-Currently Present Suicidal Intent: No Is patient at risk for suicide?: Yes Suicidal Plan?: No Specify Current Suicidal Plan: none reported Access to Means: Yes Specify Access to Suicidal Means: drugs What has been your use of drugs/alcohol within the last 12 months?: Crack, Opiates How many times?: 0 Other Self Harm Risks: Active drug use Triggers for Past Attempts: Unpredictable Intentional Self Injurious Behavior: None Risk to Others: Homicidal Ideation: No Thoughts of Harm to Others: No Current Homicidal Intent: No Current Homicidal Plan: No Access to Homicidal Means: No Identified Victim: None reported History of harm to others?: No Assessment of Violence: None Noted Violent Behavior Description: None reported Does patient have access to weapons?: No Criminal Charges Pending?: No Does patient have a court date: No Prior Inpatient Therapy: Prior Inpatient Therapy: Yes Prior Therapy Dates: 2012 Prior Therapy Facilty/Provider(s): [facility] Reason for Treatment: Depression Prior Outpatient Therapy: Prior Outpatient Therapy: No Does patient have an ACCT team?: No Does patient have Intensive In-House Services?  : No Does patient have [facility] services? : No  Does patient have P4CC services?: No  Past Medical History:  Past Medical History:  Diagnosis Date  . Bipolar 1 disorder ([place])   . Depression   .  Hypertension   . Neuropathy   . Schizophrenia ([place])   . Thyroid disease     Past Surgical History:  Procedure Laterality Date  . ABDOMINAL HYSTERECTOMY    . KNEE SURGERY Right   . TONSILLECTOMY     Family History: No family history on file. Family Psychiatric  History: Patient says all of her sisters have mood and substance abuse problems Social History:  Social History   Substance and Sexual Activity  Alcohol Use No   Comment: occ     Social History   Substance and Sexual Activity  Drug Use Yes   Comment: crack and opiates    Social History   Socioeconomic History  . Marital status: Legally Separated    Spouse name: Not on file  . Number of children: Not on file  . Years of education: Not on file  . Highest education level: Not on file  Occupational History  . Not on file  Social Needs  . Financial resource strain: Not on file  . Food insecurity:    Worry: Not on file    Inability: Not on file  . Transportation needs:    Medical: Not on file    Non-medical: Not on file  Tobacco Use  . Smoking status: Never Smoker  . Smokeless tobacco: Never Used  Substance and Sexual Activity  . Alcohol use: No    Comment: occ  . Drug use: Yes    Comment: crack and opiates  . Sexual activity: Not on file  Lifestyle  . Physical activity:    Days per week: Not on file    Minutes per session: Not on file  . Stress: Not on file  Relationships  . Social connections:    Talks on phone: Not on file    Gets together: Not on file    Attends religious service: Not on file    Active member of club or organization: Not on file    Attends meetings of clubs or organizations: Not on file    Relationship status: Not on file  Other Topics Concern  . Not on file  Social History Narrative  . Not on file   Additional Social History:    Allergies:   Allergies  Allergen Reactions  . Amoxicillin Rash    Labs:  Results for orders placed or performed during the hospital  encounter of 11/16/17 (from the past 48 hour(s))  Comprehensive metabolic panel     Status: Abnormal   Collection Time: 11/16/17 11:32 AM  Result Value Ref Range   Sodium 142 135 - 145 mmol/L   Potassium 2.7 (LL) 3.5 - 5.1 mmol/L    Comment: CRITICAL RESULT CALLED TO, READ BACK BY AND VERIFIED WITH JADEKA MANGRUM ON 11/16/17 AT 1213 QSD    Chloride 110 98 - 111 mmol/L    Comment: Please note change in reference range.   CO2 24 22 - 32 mmol/L   Glucose, Bld 170 (H) 70 - 99 mg/dL    Comment: Please note change in reference range.   BUN 20 6 - 20 mg/dL    Comment: Please note change in reference range.   Creatinine, Ser 0.75 0.44 - 1.00 mg/dL   Calcium 9.0 8.9 - 10.3 mg/dL   Total Protein 7.3 6.5 - 8.1 g/dL   Albumin 4.0  3.5 - 5.0 g/dL   AST 25 15 - 41 U/L   ALT 15 0 - 44 U/L    Comment: Please note change in reference range.   Alkaline Phosphatase 62 38 - 126 U/L   Total Bilirubin 0.6 0.3 - 1.2 mg/dL   GFR calc non Af Amer >60 >60 mL/min   GFR calc Af Amer >60 >60 mL/min    Comment: (NOTE) The eGFR has been calculated using the CKD EPI equation. This calculation has not been validated in all clinical situations. eGFR's persistently <60 mL/min signify possible Chronic Kidney Disease.    Anion gap 8 5 - 15    Comment: Performed at [facility], [address] [place], [place] [address]  Ethanol     Status: None   Collection Time: 11/16/17 11:32 AM  Result Value Ref Range   Alcohol, Ethyl (B) <10 <10 mg/dL    Comment: (NOTE) Lowest detectable limit for serum alcohol is 10 mg/dL. For medical purposes only. Performed at [facility], [address] [place], [place] [address]   Salicylate level     Status: None   Collection Time: 11/16/17 11:32 AM  Result Value Ref Range   Salicylate Lvl <3.7 2.8 - 30.0 mg/dL    Comment: Performed at [facility], [address] [place], [place] [address]  Acetaminophen level     Status: Abnormal   Collection  Time: 11/16/17 11:32 AM  Result Value Ref Range   Acetaminophen (Tylenol), Serum <10 (L) 10 - 30 ug/mL    Comment: (NOTE) Therapeutic concentrations vary significantly. A range of 10-30 ug/mL  may be an effective concentration for many patients. However, some  are best treated at concentrations outside of this range. Acetaminophen concentrations >150 ug/mL at 4 hours after ingestion  and >50 ug/mL at 12 hours after ingestion are often associated with  toxic reactions. Performed at [facility], [address] [place], [place] [address]   cbc     Status: None   Collection Time: 11/16/17 11:32 AM  Result Value Ref Range   WBC 4.3 3.6 - 11.0 K/uL   RBC 4.64 3.80 - 5.20 MIL/uL   Hemoglobin 12.9 12.0 - 16.0 g/dL   HCT 38.7 35.0 - 47.0 %   MCV 83.4 80.0 - 100.0 fL   MCH 27.8 26.0 - 34.0 pg   MCHC 33.3 32.0 - 36.0 g/dL   RDW 14.4 11.5 - 14.5 %   Platelets 209 150 - 440 K/uL    Comment: Performed at [facility], [address] [place], [place] [address]  Urine Drug Screen, Qualitative     Status: Abnormal   Collection Time: 11/16/17 12:04 PM  Result Value Ref Range   Tricyclic, Ur Screen POSITIVE (A) NONE DETECTED   Amphetamines, Ur Screen NONE DETECTED NONE DETECTED   MDMA (Ecstasy)Ur Screen NONE DETECTED NONE DETECTED   Cocaine Metabolite,Ur [place] POSITIVE (A) NONE DETECTED   Opiate, Ur Screen NONE DETECTED NONE DETECTED   Phencyclidine (PCP) Ur S NONE DETECTED NONE DETECTED   Cannabinoid 50 Ng, Ur [place] NONE DETECTED NONE DETECTED   Barbiturates, Ur Screen (A) NONE DETECTED    Result not available. Reagent lot number recalled by manufacturer.   Benzodiazepine, Ur Scrn NONE DETECTED NONE DETECTED   Methadone Scn, Ur NONE DETECTED NONE DETECTED    Comment: (NOTE) Tricyclics + metabolites, urine    Cutoff 1000 ng/mL Amphetamines + metabolites, urine  Cutoff 1000 ng/mL MDMA (Ecstasy), urine  Cutoff 500 ng/mL Cocaine Metabolite, urine          Cutoff 300  ng/mL Opiate + metabolites, urine        Cutoff 300 ng/mL Phencyclidine (PCP), urine         Cutoff 25 ng/mL Cannabinoid, urine                 Cutoff 50 ng/mL Barbiturates + metabolites, urine  Cutoff 200 ng/mL Benzodiazepine, urine              Cutoff 200 ng/mL Methadone, urine                   Cutoff 300 ng/mL The urine drug screen provides only a preliminary, unconfirmed analytical test result and should not be used for non-medical purposes. Clinical consideration and professional judgment should be applied to any positive drug screen result due to possible interfering substances. A more specific alternate chemical method must be used in order to obtain a confirmed analytical result. Gas chromatography / mass spectrometry (GC/MS) is the preferred confirmat ory method. Performed at [facility], [address], [place], [place] [address]     Current Facility-Administered Medications  Medication Dose Route Frequency Provider Last Rate Last Dose  . buprenorphine-naloxone (SUBOXONE) 8-2 mg per SL tablet 1 tablet  1 tablet Sublingual BID ,  T, MD      . gabapentin (NEURONTIN) capsule 300 mg  300 mg Oral TID ,  T, MD      . ibuprofen (ADVIL,MOTRIN) tablet 800 mg  800 mg Oral Q12H PRN ,  T, MD      . potassium chloride SA (K-DUR,KLOR-CON) CR tablet 20 mEq  20 mEq Oral BID Arta Silence, MD      . QUEtiapine (SEROQUEL) tablet 400 mg  400 mg Oral QHS ,  T, MD       Current Outpatient Medications  Medication Sig Dispense Refill  . levothyroxine (SYNTHROID, LEVOTHROID) 125 MCG tablet Take 125 mcg by mouth daily before breakfast.    . lisinopril-hydrochlorothiazide (PRINZIDE,ZESTORETIC) 20-25 MG tablet Take 1 tablet by mouth daily.    . QUEtiapine (SEROQUEL) 400 MG tablet Take 1 tablet (400 mg total) by mouth at bedtime. 7 tablet 0  . traMADol (ULTRAM) 50 MG tablet Take 1 tablet (50 mg total) by mouth every 6 (six) hours as  needed. 20 tablet 0    Musculoskeletal: Strength & Muscle Tone: within normal limits Gait & Station: normal Patient leans: N/A  Psychiatric Specialty Exam: Physical Exam  Nursing note and vitals reviewed. Constitutional: She appears well-developed and well-nourished.  HENT:  Head: Normocephalic and atraumatic.  Eyes: Pupils are equal, round, and reactive to light. Conjunctivae are normal.  Neck: Normal range of motion.  Cardiovascular: Regular rhythm and normal heart sounds.  Respiratory: Effort normal. No respiratory distress.  GI: Soft.  Musculoskeletal: Normal range of motion.  Neurological: She is alert.  Skin: Skin is warm and dry.  Psychiatric: Her speech is delayed. She is slowed. Cognition and memory are impaired. She expresses impulsivity. She exhibits a depressed mood. She expresses suicidal ideation. She expresses suicidal plans.    Review of Systems  Constitutional: Negative.   HENT: Negative.   Eyes: Negative.   Respiratory: Negative.   Cardiovascular: Negative.   Gastrointestinal: Negative.   Musculoskeletal: Positive for myalgias.  Skin: Negative.   Neurological: Negative.   Psychiatric/Behavioral: Positive for depression, memory loss, substance abuse and suicidal ideas. Negative for hallucinations. The patient is nervous/anxious and  has insomnia.     Blood pressure (!) 145/98, pulse 86, temperature 98.7 F (37.1 C), temperature source Oral, resp. rate 20, height 5' 2"  (1.575 m), weight 77.1 kg (170 lb), SpO2 95 %.Body mass index is 31.09 kg/m.  General Appearance: Disheveled  Eye Contact:  Fair  Speech:  Clear and Coherent  Volume:  Normal  Mood:  Depressed and Dysphoric  Affect:  Constricted  Thought Process:  Goal Directed  Orientation:  Full (Time, Place, and Person)  Thought Content:  Logical  Suicidal Thoughts:  Yes.  with intent/plan  Homicidal Thoughts:  No  Memory:  Immediate;   Fair Recent;   Fair Remote;   Fair  Judgement:  Fair   Insight:  Fair  Psychomotor Activity:  Decreased  Concentration:  Concentration: Fair  Recall:  [employer] of Knowledge:  Fair  Language:  Fair  Akathisia:  No  Handed:  Right  AIMS (if indicated):     Assets:  Desire for Improvement Physical Health Resilience  ADL's:  Intact  Cognition:  WNL  Sleep:        Treatment Plan Summary: Daily contact with patient to assess and evaluate symptoms and progress in treatment, Medication management and Plan Patient will be admitted to the psychiatric unit.  15-minute checks in place.  Labs will all be reviewed.  EKG will be ordered.  Full set of labs on admission downstairs.  Patient will be put back on Seroquel and Neurontin as she has quoted before.  I checked the controlled substance database and saw that she was taking Suboxone in the past implying that she may be able to get it prescribed again so I have gone ahead and put her back on the Suboxone at the same dose she was taking before of 8 mg twice a day.  Treatment team downstairs can get her involved in substance abuse treatment and assess whether this is an appropriate long-term medicine or just needed for detox.  Case reviewed with emergency room doctor and TTS.  Patient agrees to plan.  Disposition: Recommend psychiatric Inpatient admission when medically cleared. Supportive therapy provided about ongoing stressors.  Alethia Berthold, MD 11/16/2017 1:49 PM

## 2017-11-16 NOTE — ED Notes (Signed)
Pt dressed out into appropriate behavioral health clothing with this tech and Amanda,EDT in the rm. Pt belongings consist of a gray backpack with dirty clothes, a debit card, a phone charger, a pink cell phone, a gray tshirt, gray tennis shoes, white socks, a tan hair bow, white shorts and a pink bra. Pt calm and cooperative while dressing out. Pt has a bottle seroquel that was given to Helene Kelp, Therapist, sports.

## 2017-11-16 NOTE — BH Assessment (Signed)
Patient is to be admitted to [facility] by Dr. Weber Cooks.  Attending Physician will be Dr. Audie Box.   Patient has been assigned to room 305, by [place].   Intake Paper Work has been signed and placed on patient chart.  ER staff is aware of the admission:  Luann: ER Secretary   Dr. Siadecki:ER MD   Donneta Romberg: Patient's Nurse   Elberta Fortis: Patient Access.

## 2017-11-16 NOTE — ED Provider Notes (Signed)
[facility] Emergency Department Provider Note ____________________________________________   First MD Initiated Contact with Patient 11/16/17 1154     (approximate)  I have reviewed the triage vital signs and the nursing notes.   HISTORY  Chief Complaint Suicidal    HPI Teresa Jefferson is a 52 y.o. female with PMH as noted below who presents for suicidal ideation, gradual onset over the last several months, worsening recently, and not associated with a specific plan.  She reports some chronic back pain, but denies any acute medical complaints.   Past Medical History:  Diagnosis Date  . Bipolar 1 disorder ([place])   . Depression   . Hypertension   . Neuropathy   . Schizophrenia ([place])   . Thyroid disease     There are no active problems to display for this patient.   Past Surgical History:  Procedure Laterality Date  . ABDOMINAL HYSTERECTOMY    . KNEE SURGERY Right   . TONSILLECTOMY      Prior to Admission medications   Medication Sig Start Date End Date Taking? Authorizing Provider  levothyroxine (SYNTHROID, LEVOTHROID) 125 MCG tablet Take 125 mcg by mouth daily before breakfast.    [provider]  lisinopril-hydrochlorothiazide (PRINZIDE,ZESTORETIC) 20-25 MG tablet Take 1 tablet by mouth daily.    [provider]  QUEtiapine (SEROQUEL) 400 MG tablet Take 1 tablet (400 mg total) by mouth at bedtime. 11/09/16   Darel Hong, MD  traMADol (ULTRAM) 50 MG tablet Take 1 tablet (50 mg total) by mouth every 6 (six) hours as needed. 01/30/16   Daymon Larsen, MD    Allergies Amoxicillin  No family history on file.  Social History Social History   Tobacco Use  . Smoking status: Never Smoker  . Smokeless tobacco: Never Used  Substance Use Topics  . Alcohol use: No    Comment: occ  . Drug use: Yes    Comment: crack and opiates    Review of Systems  Constitutional: No fever. Eyes: No redness. ENT: No neck  pain. Cardiovascular: Denies chest pain. Respiratory: Denies shortness of breath. Gastrointestinal: No vomiting.  Genitourinary: Negative for dysuria.  Musculoskeletal: Positive for back pain. Skin: Negative for rash. Neurological: Negative for headache.   ____________________________________________   PHYSICAL EXAM:  VITAL SIGNS: ED Triage Vitals  Enc Vitals Group     BP 11/16/17 1131 (!) 145/98     Pulse Rate 11/16/17 1131 86     Resp 11/16/17 1131 20     Temp 11/16/17 1131 98.7 F (37.1 C)     Temp Source 11/16/17 1131 Oral     SpO2 11/16/17 1131 95 %     Weight 11/16/17 1131 170 lb (77.1 kg)     Height 11/16/17 1131 5\' 2"  (1.575 m)     Head Circumference --      Peak Flow --      Pain Score 11/16/17 1140 9     Pain Loc --      Pain Edu? --      Excl. in [place]? --     Constitutional: Alert and oriented. Well appearing and in no acute distress. Eyes: Conjunctivae are normal.  Head: Atraumatic. Nose: No congestion/rhinnorhea. Mouth/Throat: Mucous membranes are moist.   Neck: Normal range of motion.  Cardiovascular: Good peripheral circulation. Respiratory: Normal respiratory effort.  Gastrointestinal: No distention.  Musculoskeletal:  Extremities warm and well perfused.  Neurologic:  Normal speech and language. No gross focal neurologic deficits are appreciated.  Skin:  Skin is warm and dry. No rash noted. Psychiatric: Mood and affect are normal. Speech and behavior are normal.  ____________________________________________   LABS (all labs ordered are listed, but only abnormal results are displayed)  Labs Reviewed  COMPREHENSIVE METABOLIC PANEL - Abnormal; Notable for the following components:      Result Value   Potassium 2.7 (*)    Glucose, Bld 170 (*)    All other components within normal limits  ETHANOL  CBC  SALICYLATE LEVEL  ACETAMINOPHEN LEVEL  URINE DRUG SCREEN, QUALITATIVE ([facility] ONLY)  POC URINE PREG, ED    ____________________________________________  EKG   ____________________________________________  RADIOLOGY    ____________________________________________   PROCEDURES  Procedure(s) performed: No  Procedures  Critical Care performed: No ____________________________________________   INITIAL IMPRESSION / ASSESSMENT AND PLAN / ED COURSE  Pertinent labs & imaging results that were available during my care of the patient were reviewed by me and considered in my medical decision making (see chart for details).  52 year old female with PMH as noted above presents for suicidal ideation.  Patient reports some chronic back pain but does not have any other medical complaints.  Exam is unremarkable.  Lab work-up reveals hypokalemia, but no other acute findings.  Given that the patient is asymptomatic from this, she does not require medical admission or IV repletion.  I will order p.o. potassium, and the patient will be medically cleared.  I consulted psychiatry; the patient has been evaluated by Dr. Weber Cooks who recommends admission to inpatient psychiatry.     ____________________________________________   FINAL CLINICAL IMPRESSION(S) / ED DIAGNOSES  Final diagnoses:  Suicidal ideation      NEW MEDICATIONS STARTED DURING THIS VISIT:  New Prescriptions   No medications on file     Note:  This document was prepared using Dragon voice recognition software and may include unintentional dictation errors.    Arta Silence, MD 11/16/17 1227

## 2017-11-16 NOTE — ED Triage Notes (Signed)
Pt reports depression x3 months and SI without a plan

## 2017-11-16 NOTE — BH Assessment (Signed)
Per Dr. Prescott Gum patient meets criteria for inpatient psychiatric treatment. Pending bed assignment on BMU.

## 2017-11-17 DIAGNOSIS — F1420 Cocaine dependence, uncomplicated: Secondary | ICD-10-CM

## 2017-11-17 DIAGNOSIS — F3289 Other specified depressive episodes: Secondary | ICD-10-CM

## 2017-11-17 DIAGNOSIS — F1994 Other psychoactive substance use, unspecified with psychoactive substance-induced mood disorder: Secondary | ICD-10-CM

## 2017-11-17 DIAGNOSIS — F1120 Opioid dependence, uncomplicated: Secondary | ICD-10-CM | POA: Insufficient documentation

## 2017-11-17 DIAGNOSIS — I10 Essential (primary) hypertension: Secondary | ICD-10-CM

## 2017-11-17 DIAGNOSIS — J45909 Unspecified asthma, uncomplicated: Secondary | ICD-10-CM

## 2017-11-17 LAB — LIPID PANEL
CHOL/HDL RATIO: 2.7 ratio
Cholesterol: 164 mg/dL (ref 0–200)
HDL: 60 mg/dL (ref >40)
LDL Cholesterol: 81 mg/dL (ref 0–99)
TRIGLYCERIDES: 117 mg/dL (ref <150)
VLDL: 23 mg/dL (ref 0–40)

## 2017-11-17 LAB — HEMOGLOBIN A1C
Hgb A1c MFr Bld: 6.1 % — ABNORMAL HIGH (ref 4.8–5.6)
MEAN PLASMA GLUCOSE: 128.37 mg/dL

## 2017-11-17 LAB — TSH: TSH: 5.004 u[IU]/mL — ABNORMAL HIGH (ref 0.350–4.500)

## 2017-11-17 MED ORDER — LORATADINE 10 MG PO TABS
10.0000 mg | ORAL_TABLET | Freq: Every day | ORAL | Status: DC
  Administered 2017-11-17 – 2017-11-25 (×7): 10 mg via ORAL
  Filled 2017-11-17 (×7): qty 1

## 2017-11-17 MED ORDER — LAMOTRIGINE 25 MG PO TABS
25.0000 mg | ORAL_TABLET | Freq: Every day | ORAL | Status: DC
  Administered 2017-11-17 – 2017-11-28 (×10): 25 mg via ORAL
  Filled 2017-11-17 (×11): qty 1

## 2017-11-17 MED ORDER — LISINOPRIL 5 MG PO TABS
5.0000 mg | ORAL_TABLET | Freq: Every day | ORAL | Status: DC
  Administered 2017-11-17 – 2017-12-06 (×19): 5 mg via ORAL
  Filled 2017-11-17 (×20): qty 1

## 2017-11-17 MED ORDER — ALBUTEROL SULFATE HFA 108 (90 BASE) MCG/ACT IN AERS
1.0000 | INHALATION_SPRAY | Freq: Four times a day (QID) | RESPIRATORY_TRACT | Status: DC | PRN
  Filled 2017-11-17: qty 6.7

## 2017-11-17 MED ORDER — CARBAMIDE PEROXIDE 6.5 % OT SOLN
5.0000 [drp] | Freq: Two times a day (BID) | OTIC | Status: AC
  Administered 2017-11-17 – 2017-11-19 (×3): 5 [drp] via OTIC
  Filled 2017-11-17: qty 15

## 2017-11-17 MED ORDER — IBUPROFEN 600 MG PO TABS
600.0000 mg | ORAL_TABLET | Freq: Four times a day (QID) | ORAL | Status: DC | PRN
  Administered 2017-11-17 – 2017-11-20 (×9): 600 mg via ORAL
  Filled 2017-11-17 (×9): qty 1

## 2017-11-17 NOTE — Tx Team (Addendum)
Interdisciplinary Treatment and Diagnostic Plan Update  11/17/2017 Time of Session: 11:00 AM Teresa Jefferson MRN: [id number]  Principal Diagnosis: <principal problem not specified>  Secondary Diagnoses: Active Problems:   Severe recurrent major depression without psychotic features ([place])   Essential hypertension   Asthma   Current Medications:  Current Facility-Administered Medications  Medication Dose Route Frequency Provider Last Rate Last Dose  . acetaminophen (TYLENOL) tablet 650 mg  650 mg Oral Q6H PRN [name], [name], MD      . alum & mag hydroxide-simeth (MAALOX/MYLANTA) 200-200-20 MG/5ML suspension 30 mL  30 mL Oral Q4H PRN [name], [name], MD      . buprenorphine-naloxone (SUBOXONE) 8-2 mg per SL tablet 1 tablet  1 tablet Sublingual BID [name], [name], MD   1 tablet at 11/17/17 [phone]  . carbamide peroxide (DEBROX) 6.5 % OTIC (EAR) solution 5 drop  5 drop Right EAR BID [name], MD      . gabapentin (NEURONTIN) capsule 300 mg  300 mg Oral TID [name], [name], MD   300 mg at 11/17/17 1218  . hydrOXYzine (ATARAX/VISTARIL) tablet 50 mg  50 mg Oral TID PRN [name], [name], MD      . ibuprofen (ADVIL,MOTRIN) tablet 600 mg  600 mg Oral Q6H PRN [name], MD      . lamoTRIgine (LAMICTAL) tablet 25 mg  25 mg Oral Daily [name], MD   25 mg at 11/17/17 1014  . lisinopril (PRINIVIL,ZESTRIL) tablet 5 mg  5 mg Oral Daily [name], MD   5 mg at 11/17/17 1014  . loratadine (CLARITIN) tablet 10 mg  10 mg Oral Daily [name], MD   10 mg at 11/17/17 1014  . magnesium hydroxide (MILK OF MAGNESIA) suspension 30 mL  30 mL Oral Daily PRN [name], [name], MD      . potassium chloride SA (K-DUR,KLOR-CON) CR tablet 20 mEq  20 mEq Oral BID [name], [name], MD   20 mEq at 11/17/17 0811  . QUEtiapine (SEROQUEL) tablet 400 mg  400 mg Oral QHS [name], [name], MD   400 mg at 11/16/17 2124   PTA Medications: Medications Prior to Admission  Medication Sig Dispense Refill Last  Dose  . levothyroxine (SYNTHROID, LEVOTHROID) 125 MCG tablet Take 125 mcg by mouth daily before breakfast.     . lisinopril-hydrochlorothiazide (PRINZIDE,ZESTORETIC) 20-25 MG tablet Take 1 tablet by mouth daily.   04/28/2017 at Unknown time  . QUEtiapine (SEROQUEL) 400 MG tablet Take 1 tablet (400 mg total) by mouth at bedtime. 7 tablet 0 04/28/2017 at  2200  . traMADol (ULTRAM) 50 MG tablet Take 1 tablet (50 mg total) by mouth every 6 (six) hours as needed. 20 tablet 0     Patient Stressors: Financial difficulties Health problems Legal issue Medication change or noncompliance Occupational concerns Substance abuse  Patient Strengths: Ability for insight Armed forces logistics/support/administrative officer Motivation for treatment/growth  Treatment Modalities: Medication Management, Group therapy, Case management,  1 to 1 session with clinician, Psychoeducation, Recreational therapy.   Physician Treatment Plan for Primary Diagnosis: <principal problem not specified> Long Term Goal(s):     Short Term Goals:    Medication Management: Evaluate patient's response, side effects, and tolerance of medication regimen.  Therapeutic Interventions: 1 to 1 sessions, Unit Group sessions and Medication administration.  Evaluation of Outcomes: Progressing  Physician Treatment Plan for Secondary Diagnosis: Active Problems:   Severe recurrent major depression without psychotic features ([place])   Essential hypertension   Asthma  Long Term Goal(s):     Short Term Goals:       Medication Management: Evaluate patient's response, side effects, and tolerance of medication regimen.  Therapeutic Interventions: 1 to 1 sessions, Unit Group sessions and Medication administration.  Evaluation of Outcomes: Progressing   RN Treatment Plan for Primary Diagnosis: <principal problem not specified> Long Term Goal(s): Knowledge of disease and therapeutic regimen to maintain health will improve  Short Term Goals: Ability to participate  in decision making will improve, Ability to disclose and discuss suicidal ideas and Compliance with prescribed medications will improve  Medication Management: RN will administer medications as ordered by provider, will assess and evaluate patient's response and provide education to patient for prescribed medication. RN will report any adverse and/or side effects to prescribing provider.  Therapeutic Interventions: 1 on 1 counseling sessions, Psychoeducation, Medication administration, Evaluate responses to treatment, Monitor vital signs and CBGs as ordered, Perform/monitor CIWA, COWS, AIMS and Fall Risk screenings as ordered, Perform wound care treatments as ordered.  Evaluation of Outcomes: Progressing   LCSW Treatment Plan for Primary Diagnosis: <principal problem not specified> Long Term Goal(s): Safe transition to appropriate next level of care at discharge, Engage patient in therapeutic group addressing interpersonal concerns.  Short Term Goals: Engage patient in aftercare planning with referrals and resources and Increase skills for wellness and recovery  Therapeutic Interventions: Assess for all discharge needs, 1 to 1 time with Social worker, Explore available resources and support systems, Assess for adequacy in community support network, Educate family and significant other(s) on suicide prevention, Complete Psychosocial Assessment, Interpersonal group therapy.  Evaluation of Outcomes: Progressing   Progress in Treatment: Attending groups: No. Participating in groups: No. Taking medication as prescribed: Yes. Toleration medication: No. Family/Significant other contact made: No, will contact:  Pt refused to have family support contacted. Patient understands diagnosis: Yes. Discussing patient identified problems/goals with staff: Yes. Medical problems stabilized or resolved: Yes. Denies suicidal/homicidal ideation: Yes. Issues/concerns per patient self-inventory: No. Other:  n/a  New problem(s) identified: No, Describe:  No new problems identified  New Short Term/Long Term Goal(s):  Patient Goals: "To get clean so I can get back in with my family and grandkids;  Get my mind where I can make better decisions"   Discharge Plan or Barriers: Tentative discharge plan is for pt to either go to a boarding house, group home, or longer term rehab facility.  Pt would like to be referred to an outpatient provider that will continue with her Suboxone treatment.  Reason for Continuation of Hospitalization: Anxiety Depression Medication stabilization   Estimated Length of Stay: 5-7 days  Recreational Therapy: Patient Stressors: Homeless, Relationship  Patient Goal: Patient will identify 3 positive coping skills strategies to use post d/c within 5 recreation therapy group sessions  Attendees: Patient: Kyren Vaux 11/17/2017 12:43 PM  Physician: Andrena Mews, MD 11/17/2017 12:43 PM  Nursing:  11/17/2017 12:43 PM  RN Care Manager: 11/17/2017 12:43 PM  Social Worker: Derrek Gu, LCSW 11/17/2017 12:43 PM  Recreational Therapist:  11/17/2017 12:43 PM  Other: Alden Hipp, LCSW 11/17/2017 12:43 PM  Other: Marney Doctor, Chaplain 11/17/2017 12:43 PM  Other: 11/17/2017 12:43 PM    Scribe for Treatment Team: Devona Konig, LCSW 11/17/2017 12:43 PM

## 2017-11-17 NOTE — BHH Group Notes (Signed)
LCSW Group Therapy Note  11/17/2017 1:00 pm  Type of Therapy and Topic:  Group Therapy:  Feelings around Relapse and Recovery  Participation Level:  None   Description of Group:    Patients in this group will discuss emotions they experience before and after a relapse. They will process how experiencing these feelings, or avoidance of experiencing them, relates to having a relapse. Facilitator will guide patients to explore emotions they have related to recovery. Patients will be encouraged to process which emotions are more powerful. They will be guided to discuss the emotional reaction significant others in their lives may have to their relapse or recovery. Patients will be assisted in exploring ways to respond to the emotions of others without this contributing to a relapse.  Therapeutic Goals: 1. Patient will identify two or more emotions that lead to a relapse for them 2. Patient will identify two emotions that result when they relapse 3. Patient will identify two emotions related to recovery 4. Patient will demonstrate ability to communicate their needs through discussion and/or role plays   Summary of Patient Progress: Teresa Jefferson did not actively participate in today's group discussion on feelings around relapse and recovery.  She slept throughout the group, but remain in group the entire time.    Therapeutic Modalities:   Cognitive Behavioral Therapy Solution-Focused Therapy Assertiveness Training Relapse Prevention Therapy   Devona Konig, LCSW 11/17/2017 1:41 PM

## 2017-11-17 NOTE — Plan of Care (Signed)
Patient is kind of med seeking,not happy with her new order of Motrin 600mg  Q 6 PRN.Denies SI,HI and AVH.Rated her depression 6/10.Appetite and energy level good.Compliant with medications.Support and encouragement given.

## 2017-11-17 NOTE — BHH Counselor (Signed)
Adult Comprehensive Assessment  Patient ID: Teresa Jefferson, female   DOB: 08-28-1965, 52 y.o.   MRN: [id number]  Information Source: Information source: Patient  Current Stressors:  Patient states their primary concerns and needs for treatment are:: "Depression getting off drugs" Patient states their goals for this hospitilization and ongoing recovery are:: "I want to get clean so I can be a better person;  mother" Educational / Learning stressors: None noted Employment / Job issues: Pt does not work.  She receives disability benefits Family Relationships: Pt states that she does not have good relationships with any of her family. Financial / Lack of resources (include bankruptcy): No issues noted.  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  Pt is homeless.  She states that she has been living "here and there" How long has patient lived in current situation?:  1 1/2 months What is atmosphere in current home: Temporary  Family History:  Marital status: Separated(Pt shared that she has been married for 20 years) Separated, when?: "I just left my husband a few days ago" What types of issues is patient dealing with in the relationship?: "I don't really want to get into that.  Just say it's been bad for awhile" Additional relationship information: None noted Are you sexually active?: Yes What is your sexual orientation?: Heterosexual Has your sexual activity  been affected by drugs, alcohol, medication, or emotional stress?: No Does patient have children?: Yes How many children?: 3 How is patient's relationship with their children?: Pt has 3 adult children.  She shared that her children are made with her  Childhood History:  By whom was/is the patient raised?: Both parents Additional childhood history information: Pt was born and raised in [place], [place] Description of patient's relationship with caregiver when they were a child: "It was good" Patient's description of current relationship with people who raised him/her: Both of pt's parents are deceased How were you disciplined when you got in trouble as a child/adolescent?: "I was whipped" Does patient have siblings?: Yes Number of Siblings: 7 Description of patient's current relationship with siblings: Pt has 17 sisters and 1 brother. She shared that she and her siblings stopped talking when her mother died in 10. Did patient suffer any verbal/emotional/physical/sexual abuse as a child?: No Did patient suffer from severe childhood neglect?: No Has patient ever been sexually abused/assaulted/raped as an adolescent or adult?: No Was the patient ever a victim of a crime or a disaster?: No Witnessed domestic violence?: No Has patient been effected by domestic violence as an adult?: No  Education:  Highest grade of school patient has completed: GED Currently a Ship broker?: No Learning disability?: No  Employment/Work Situation:   Employment situation: On disability Why is patient on disability: Physical Health Issues How long has patient been on disability: 7 years Patient's job has been impacted by current illness: No What is the longest time patient has a held a job?: 5 years 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  Pt shared that she recently relapsed and have been using regularly for a few weeks now If attempted suicide, did drugs/alcohol play a role in this?: No Alcohol/Substance Abuse Treatment Hx: Past Tx, Inpatient If yes, describe treatment: Rehab Has alcohol/substance abuse ever caused legal problems?: No  Social Support System:   Heritage manager System: Poor Describe Community Support System: Pt shared that she doesn't get support from her family and her "friends" only support her to use drugs Type of faith/religion: Nondenominational How does patient's faith help to cope with current illness?: "I pray a lot"  Leisure/Recreation:   Leisure and Hobbies: "I don't really have any"  Strengths/Needs:   What is the patient's perception of their strengths?: "I'm strong willed and determined to get clean" Patient states they can use these personal strengths during their treatment to contribute to their recovery: "I'm determined to get clean" Patient states these barriers may affect/interfere with their treatment: "If I can't stay away from negative people and relapse again" Patient states these barriers may affect their return to the community: "I have nowhere else to go" Other important information patient would like considered in planning for their treatment: Nothing noted  Discharge Plan:   Currently receiving community mental health services: No(Pt will be referred to an outpatient provider that can continue prescribing and monitoring her Suboxone.) Patient states concerns and preferences for aftercare planning are: Nothing noted.  Pt would like to continue to receive her Suboxone medication. Patient states  they will know when they are safe and ready for discharge when: "I'm not so sleepy and tired and more hopeful" Does patient have access to transportation?: Yes(Pt has access to public transportation) Does patient have financial barriers related to discharge medications?: No Patient description of barriers related to discharge medications: None noted Plan for living situation after discharge: Pt would like to go to a boarding house, longer term rehab, or a group home Will patient be returning to same living situation after discharge?: No  Summary/Recommendations:   Summary and Recommendations (to be completed by the evaluator): Pt is a 52 yo female currently homeless, but living in [place], [place].  Pt recently presented to the ED voluntarily reporting worsening depression and SI.  Pt denied having any past suicide attempts.  She did not present with any psychosis.  Pt also reported that she has been using crack cocaine and abusing Percocets on a regular basis.  Pt reports having no real supports.  Pt reports that she has been in rehab and have had one past inpatient hospitalization for depression.  Pt was started on Suboxone in the ED and would like to continue with this treatment for her opiate abuse.  Pt does not currently have an outpatient provider.  Recommendations for pt include crisis stabilization, medication management, therapeutic milieu, encouragement of attendance and participation in groups, and development of a comprehensive wellness plan.  Pt states that she would like to explore the options of going to live in a boarding house, group home, or participating in a longer term rehab program.  CSW along with pt's tx team will continue to work on an  appropriate discharge plan for pt.  Devona Konig, LCSW 11/17/2017
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
## 2017-11-17 NOTE — Plan of Care (Signed)
Patient is adjusting well in the unit, socializing with peers and complying with her treatment regimen, appetite is good and sleeping long hours with out any interruptions, contract for safety of self and others 15 minute rounding is maintained no distress. Problem: Education: Goal: Knowledge of [facility] Education information/materials will improve 11/17/2017 0408 by Clemens Catholic, RN Outcome: Progressing 11/17/2017 0343 by Clemens Catholic, RN Outcome: Progressing Goal: Emotional status will improve 11/17/2017 0408 by Clemens Catholic, RN Outcome: Progressing 11/17/2017 0343 by Clemens Catholic, RN Outcome: Progressing Goal: Mental status will improve 11/17/2017 0408 by Clemens Catholic, RN Outcome: Progressing 11/17/2017 0343 by Clemens Catholic, RN Outcome: Progressing Goal: Verbalization of understanding the information provided will improve 11/17/2017 0408 by Clemens Catholic, RN Outcome: Progressing 11/17/2017 0343 by Clemens Catholic, RN Outcome: Progressing   Problem: Safety: Goal: Periods of time without injury will increase 11/17/2017 0408 by Clemens Catholic, RN Outcome: Progressing 11/17/2017 0343 by Clemens Catholic, RN Outcome: Progressing   Problem: Safety: Goal: Ability to disclose and discuss suicidal ideas will improve 11/17/2017 0408 by Clemens Catholic, RN Outcome: Progressing 11/17/2017 0343 by Clemens Catholic, RN Outcome: Progressing   Problem: Safety: Goal: Ability to remain free from injury will improve 11/17/2017 0408 by Clemens Catholic, RN Outcome: Progressing 11/17/2017 0343 by Clemens Catholic, RN Outcome: Progressing

## 2017-11-18 DIAGNOSIS — F1994 Other psychoactive substance use, unspecified with psychoactive substance-induced mood disorder: Secondary | ICD-10-CM

## 2017-11-18 DIAGNOSIS — Z5181 Encounter for therapeutic drug level monitoring: Secondary | ICD-10-CM

## 2017-11-18 DIAGNOSIS — F3289 Other specified depressive episodes: Secondary | ICD-10-CM

## 2017-11-18 LAB — MAGNESIUM: Magnesium: 2.1 mg/dL (ref 1.7–2.4)

## 2017-11-18 LAB — POTASSIUM: Potassium: 3.9 mmol/L (ref 3.5–5.1)

## 2017-11-18 NOTE — Progress Notes (Signed)
Patient requested for $2.86 and 3 debit cards to be sent home with daughter. Obtained patient signature and obtains items out of patient property and delivered to patient's daughters to be sent home.

## 2017-11-18 NOTE — [facility] Group Notes (Signed)
[place] Group Notes:  (Nursing/MHT/Case Management/Adjunct)  Date:  11/18/2017  Time:  10:21 PM  Type of Therapy:  Group Therapy  Participation Level:  Active  Participation Quality:  Attentive  Affect:  Appropriate  Cognitive:  Appropriate  Insight:  Appropriate  Engagement in Group:  Engaged  Modes of Intervention:  Discussion  Summary of Progress/Problems: MHT reviewed rules and expectations of the unit. MHT informed patients of visitation and phone hours. MHT informed patients of routine checks throughout the night and reminded to cover up. MHT engaged patients with an introduction game. Game was to make 3 statements, 2 being true, one being false and the group deciding which was false. MHT had patients tell goal for the day and if they were able to accomplish it. MHT reviewed what was discussed in groups on this day, recovery and if they learned anything useful to do when released. MHT discussed the thought, feeling, behavioral model. MHT informed patients not to dwell on the negative things in life, but to focus on what was needed to make the change. MHT encouraged patients not to dwell on things they had no control over. MHT encouraged patients to make changes by doing things differently. MHT encouraged patients to make changes that would promote positive changes in their lives. Teresa Jefferson did not participate in the group activity. Teresa Jefferson only introduced herself and chose not to talk in group. Teresa Jefferson was attentive throughout group. Barnie Mort 11/18/2017, 10:21 PM

## 2017-11-18 NOTE — Plan of Care (Signed)
D: Patient denies SI/HI/AVH. Patient verbally contracts for safety. Patient is calm, cooperative and pleasant. Patient is seen in milieu interacting with peers. Patient has compliant of left ear pain.   A: Patient was assessed by this nurse. Patient was oriented to unit. Patient's safety was maintained on unit. Q x 15 minute observation checks were completed for safety. Patient care plan was reviewed. Patient was offered support and encouragement. Patient was encourage to attend groups, participate in unit activities and continue with plan of care.    R: Patient has no complaints of pain at this time. Patient is receptive to treatment and safety maintained on unit.     Problem: Education: Goal: Knowledge of [place] [facility] Education information/materials will improve Outcome: Progressing Goal: Mental status will improve Outcome: Progressing   Problem: Safety: Goal: Periods of time without injury will increase Outcome: Progressing   Problem: Safety: Goal: Ability to disclose and discuss suicidal ideas will improve Outcome: Progressing

## 2017-11-18 NOTE — [facility] Suicide Risk Assessment (Signed)
[place] INPATIENT:  Family/Significant Other Suicide Prevention Education  Suicide Prevention Education:  Patient Refusal for Family/Significant Other Suicide Prevention Education: The patient Teresa Jefferson has refused to provide written consent for family/significant other to be provided Family/Significant Other Suicide Prevention Education during admission and/or prior to discharge.  Physician notified.  Carloyn Jaeger , LCSW 11/18/2017, 9:12 AM

## 2017-11-18 NOTE — Plan of Care (Addendum)
Patient found asleep in bed upon my arrival. Patient is visible and somewhat social this evening. Continues to reports depression and anxiety but feels it has improved. Denies SI/HI/AVH. Patient is polite and pleasant, cooperative and calm. Complains of 10/10 left ear pain, given Motrin and Tylenol with some relief reported. Reports eating and voiding adequately. Compliant with HS medications and staff direction. Q 15 minute checks maintained. Will continue to monitor throughout the shift. Patient slept 7.5 hours. No apparent distress. Compliant with EKG. Will endorse care to oncoming shift.  Problem: Education: Goal: Knowledge of [facility] Education information/materials will improve Outcome: Progressing Goal: Emotional status will improve Outcome: Progressing Goal: Mental status will improve Outcome: Progressing Goal: Verbalization of understanding the information provided will improve Outcome: Progressing   Problem: Safety: Goal: Periods of time without injury will increase Outcome: Progressing   Problem: Safety: Goal: Ability to disclose and discuss suicidal ideas will improve Outcome: Progressing

## 2017-11-18 NOTE — Progress Notes (Addendum)
[facility] MD Progress Note  11/18/2017 12:32 PM Teresa Jefferson  MRN:  [id number] Subjective:   I'm ok ,  Pt reports feeling better, less depressed, reports some pain L ear for 2 days, no fever, no tenderness, will monitor. Denies AVH. Denies Si/HI. On subutex. K- 3.9. Pt reporting pain around left ear, will consult ENT, called [facility].  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 Essential hypertension [I10] 11/17/2017  . Asthma [J45.909] 11/17/2017  . 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 Cocaine use disorder, severe, dependence ([place]) [F14.20] 11/17/2017  . Opioid use disorder, severe, dependence ([place]) [F11.20] 11/17/2017  . Severe recurrent major depression without psychotic features ([place]) [F33.2] 11/16/2017  . Opiate abuse, continuous ([place]) [F11.10] 11/16/2017  . Cocaine abuse ([place]) [F14.10] 11/16/2017  . Suicidal ideation [R45.851] 11/16/2017  . Chronic pain [G89.29] 11/16/2017   Total Time spent with patient: 25 min  Past Psychiatric History: no new info  Past Medical History:  Past Medical History:  Diagnosis Date  . Bipolar 1 disorder ([place])   . Depression   . Hypertension   . Neuropathy   . Schizophrenia ([place])   . Thyroid disease     Past Surgical History:  Procedure Laterality Date  . ABDOMINAL HYSTERECTOMY    . KNEE SURGERY Right   . TONSILLECTOMY     Family History: History reviewed. No pertinent family history. Family Psychiatric  History: no new info Social History:  Social History   Substance and Sexual Activity  Alcohol Use No   Comment: occ     Social History   Substance and Sexual Activity  Drug Use Yes   Comment: crack and opiates    Social History   Socioeconomic History  . Marital status: Legally Separated    Spouse name: Not on file  . Number of children: Not on file  . Years of  education: Not on file  . Highest education level: Not on file  Occupational History  . Not on file  Social Needs  . Financial resource strain: Not on file  . Food insecurity:    Worry: Not on file    Inability: Not on file  . Transportation needs:    Medical: Not on file    Non-medical: Not on file  Tobacco Use  . Smoking status: Never Smoker  . Smokeless tobacco: Never Used  Substance and Sexual Activity  . Alcohol use: No    Comment: occ  . Drug use: Yes    Comment: crack and opiates  . Sexual activity: Not on file  Lifestyle  . Physical activity:    Days per week: Not on file    Minutes per session: Not on file  . Stress: Not on file  Relationships  . Social connections:    Talks on phone: Not on file    Gets together: Not on file    Attends religious service: Not on file    Active member of club or organization: Not on file    Attends meetings of clubs or organizations: Not on file    Relationship status: Not on file  Other Topics Concern  . Not on file  Social History Narrative  . Not on file   Additional Social History:                         Sleep: Fair  Appetite:  Fair  Current Medications: Current Facility-Administered Medications  Medication Dose Route Frequency Provider Last Rate Last Dose  . acetaminophen (TYLENOL) tablet 650 mg  650 mg Oral Q6H PRN [name], [name], MD   650 mg at 11/18/17 0349  . albuterol (PROVENTIL HFA;VENTOLIN HFA) 108 (90 Base) MCG/ACT inhaler 1-2 puff  1-2 puff Inhalation Q6H PRN [name], MD      . alum & mag hydroxide-simeth (MAALOX/MYLANTA) 200-200-20 MG/5ML suspension 30 mL  30 mL Oral Q4H PRN [name], [name], MD      . buprenorphine-naloxone (SUBOXONE) 8-2 mg per SL tablet 1 tablet  1 tablet Sublingual BID [name], [name], MD   1 tablet at 11/18/17 0818  . carbamide peroxide (DEBROX) 6.5 % OTIC (EAR) solution 5 drop  5 drop Right EAR BID [name], MD   5 drop at 11/18/17 0818  . gabapentin  (NEURONTIN) capsule 300 mg  300 mg Oral TID [name], [name], MD   300 mg at 11/18/17 1140  . hydrOXYzine (ATARAX/VISTARIL) tablet 50 mg  50 mg Oral TID PRN [name], [name], MD      . ibuprofen (ADVIL,MOTRIN) tablet 600 mg  600 mg Oral Q6H PRN [name], MD   600 mg at 11/18/17 0823  . lamoTRIgine (LAMICTAL) tablet 25 mg  25 mg Oral Daily [name], MD   25 mg at 11/18/17 0818  . lisinopril (PRINIVIL,ZESTRIL) tablet 5 mg  5 mg Oral Daily [name], MD   5 mg at 11/18/17 0818  . loratadine (CLARITIN) tablet 10 mg  10 mg Oral Daily [name], MD   10 mg at 11/18/17 0818  . magnesium hydroxide (MILK OF MAGNESIA) suspension 30 mL  30 mL Oral Daily PRN [name], [name], MD      . potassium chloride SA (K-DUR,KLOR-CON) CR tablet 20 mEq  20 mEq Oral BID [name], [name], MD   20 mEq at 11/18/17 0818  . QUEtiapine (SEROQUEL) tablet 400 mg  400 mg Oral QHS [name], [name], MD   400 mg at 11/17/17 2110    Lab Results:  Results for orders placed or performed during the hospital encounter of 11/16/17 (from the past 48 hour(s))  Potassium     Status: None   Collection Time: 11/18/17  6:45 AM  Result Value Ref Range   Potassium 3.9 3.5 - 5.1 mmol/L    Comment: HEMOLYSIS AT THIS LEVEL MAY AFFECT RESULT Performed at [facility], [address] [place], [place] [address]   Magnesium     Status: None   Collection Time: 11/18/17  6:45 AM  Result Value Ref Range   Magnesium 2.1 1.7 - 2.4 mg/dL    Comment: Performed at [facility], [address] [place],  [address]    Blood Alcohol level:  Lab Results  Component Value Date   [facility] <10 11/16/2017   ETH <10 94/80/1655    Metabolic Disorder Labs: Lab Results  Component Value Date   HGBA1C 6.1 (H) 11/16/2017   MPG 128.37 11/16/2017   No results found for: PROLACTIN Lab Results  Component Value Date   CHOL 164 11/16/2017   TRIG 117 11/16/2017   HDL 60 11/16/2017   CHOLHDL 2.7 11/16/2017   VLDL 23  11/16/2017   LDLCALC 81 11/16/2017    Physical Findings: AIMS:  , ,  ,  ,    CIWA:    COWS:     Musculoskeletal: Strength & Muscle Tone: within normal  limits Gait & Station: normal Patient leans:   Psychiatric Specialty Exam: Physical Exam  Nursing note and vitals reviewed.   ROS  Blood pressure (!) 158/98, pulse 94, temperature 98.6 F (37 C), temperature source Oral, resp. rate 18, height 5\' 2"  (1.575 m), weight 78 kg (172 lb), SpO2 100 %.Body mass index is 31.46 kg/m.  General Appearance: appears older than stated age  Eye Contact:  Good  Speech:  Normal Rate  Volume:  Normal  Mood:  Depressed, Hopeless and Worthless  Affect:  Congruent  Thought Process:  Coherent and Linear  Orientation:  Full (Time, Place, and Person)  Thought Content:  Logical  Suicidal Thoughts:  pt denies active SI  Homicidal Thoughts:  pt denies active SI  Memory:  intact  Judgement:  Fair  Insight:  Fair  Psychomotor Activity:  Normal  Concentration:  Concentration: Fair  Recall:  [place] of Knowledge:  Fair  Language:  Good  Akathisia:  No  AIMS (if indicated):   0  Assets:  Communication Skills Desire for Improvement  ADL's:  Intact  Cognition:  WNL  Sleep:  Number of Hours: 7.5             Treatment Plan Summary: Substance-induced Depression;  History of Bipolar Disorder; Anxiety disorder, unspecified Opioid use disorder, severe Cocaine use disorder, severe with associated withdrawal symptoms Hypokalemia (potassium 2.7 upon admission)likely due to poor nutrition and use of HCTZ (was intermittently taking combo Lisinopril-HCTZ as outpt) Chronic pain HTN Asthma/Seasonal Allergies Right ear wax causing decrease hearing Fever Blister   Daily contact with patient to assess and evaluate symptoms and progress in treatment and Medication management. Pt reporting pain around left ear, will consult ENT, called [facility].    Substance-induced Depression;  History of  Bipolar Disorder; Anxiety disorder, unspecified. Mood improving slowly. K-3.9, will d/c k-dur.  -Continue seroquel ER 400 mg nightly -lamictal just started. 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[place]) Long Term Goal(s): Improvement in symptoms so as ready for discharge  Short Term Goals: Ability to identify changes in lifestyle to reduce recurrence of condition will improve, Ability to verbalize feelings will improve and Ability to identify triggers associated with substance abuse/mental health issues will improve  Physician Treatment Plan for Secondary Diagnosis: Principal Problem:   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[place]) Active Problems:   Severe recurrent major depression without psychotic features ([place])   Essential hypertension   Asthma   Cocaine use disorder, severe, dependence ([place])   Opioid use disorder, severe, dependence ([place])  Long Term Goal(s): Improvement in symptoms so as ready for discharge  Short Term Goals: Ability to verbalize feelings will improve, Ability to disclose and discuss suicidal ideas, Ability to identify and develop effective coping behaviors will improve, Compliance with prescribed medications will improve and Ability to identify triggers associated with substance abuse/mental health issues will improve  I certify that inpatient services furnished can reasonably be expected to improve the patient's condition.       [name], MD 11/18/2017, 12:32 PM

## 2017-11-18 NOTE — [facility] Group Notes (Signed)
LCSW Group Therapy Note  11/18/2017 1:15pm  Type of Therapy and Topic:  Group Therapy:  Feelings around Relapse and Recovery  Participation Level:  Minimal   Description of Group:    Patients in this group will discuss emotions they experience before and after a relapse. They will process how experiencing these feelings, or avoidance of experiencing them, relates to having a relapse. Facilitator will guide patients to explore emotions they have related to recovery. Patients will be encouraged to process which emotions are more powerful. They will be guided to discuss the emotional reaction significant others in their lives may have to their relapse or recovery. Patients will be assisted in exploring ways to respond to the emotions of others without this contributing to a relapse.  Therapeutic Goals: 1. Patient will identify two or more emotions that lead to a relapse for them 2. Patient will identify two emotions that result when they relapse 3. Patient will identify two emotions related to recovery 4. Patient will demonstrate ability to communicate their needs through discussion and/or role plays   Summary of Patient Progress: Pt did not share much about herself, but was supportive of others.  She appears a bit drowsy.  Closing her eyes and nodding off.    Therapeutic Modalities:   Cognitive Behavioral Therapy Solution-Focused Therapy Assertiveness Training Relapse Prevention Therapy   August Saucer, LCSW 11/18/2017 2:29 PM

## 2017-11-19 MED ORDER — OFLOXACIN 0.3 % OP SOLN
3.0000 [drp] | Freq: Four times a day (QID) | OPHTHALMIC | Status: AC
  Administered 2017-11-19 – 2017-11-24 (×16): 3 [drp] via OTIC
  Filled 2017-11-19: qty 5

## 2017-11-19 MED ORDER — GABAPENTIN 300 MG PO CAPS
300.0000 mg | ORAL_CAPSULE | Freq: Three times a day (TID) | ORAL | Status: DC
  Administered 2017-11-19 – 2017-11-28 (×24): 300 mg via ORAL
  Filled 2017-11-19 (×24): qty 1

## 2017-11-19 NOTE — Plan of Care (Signed)
Patient verbalizes understanding of the general information that's been provided to her and has not voiced any further questions/concerns at this time. Patient denies SI/HI/AVH as well as any anxiety. Patient does endorse depression and rates it a "8/10" stating it's because she is "hopeless". Patient has been free from injury on the unit thus far and remains safe at this time.  Problem: Education: Goal: Knowledge of [facility] Education information/materials will improve Outcome: Progressing Goal: Emotional status will improve Outcome: Progressing Goal: Mental status will improve Outcome: Progressing Goal: Verbalization of understanding the information provided will improve Outcome: Progressing   Problem: Safety: Goal: Periods of time without injury will increase Outcome: Progressing   Problem: Safety: Goal: Ability to disclose and discuss suicidal ideas will improve Outcome: Progressing   Problem: Safety: Goal: Ability to remain free from injury will improve Outcome: Progressing

## 2017-11-19 NOTE — [facility] Group Notes (Signed)
LCSW Group Therapy Notes 11/19/2017 1:00pm Type of Therapy and Topic:  Group Therapy:  Communication Participation Level:  None  Description of Group: Patients will identify how individuals communicate with one another appropriately and inappropriately.  Patients will be guided to discuss their thoughts, feelings and behaviors related to barriers when communicating.  The group will process together ways to execute positive and appropriate communication with attention given to how one uses behavior, tone and body language.  Patients will be encouraged to reflect on a situation where they were successfully able to communicate and what made this example successful.  Group will identify specific changes they are motivated to make in order to overcome communication barriers with self, peers, authority, and parents.  This group will be process-oriented with patients participating in exploration of their own experiences, giving and receiving support, and challenging self and other group members.   Therapeutic Goals 1. Patient will identify how people communicate (body language, facial expression, and electronics).  Group will also discuss tone, voice and how these impact what is communicated and what is received. 2. Patient will identify feelings (such as fear or worry), thought process and behaviors related to why people internalize feelings rather than express self openly. 3. Patient will identify two changes they are willing to make to overcome communication barriers 4. Members will then practice through role play how to communicate using I statements, I feel statements, and acknowledging feelings rather than displacing feelings on others Summary of Patient Progress: Pt attended group however did not participate.  Pt presents over-sedated.  Eyes closed and head nodding.  Therapeutic Modalities Cognitive Behavioral Therapy Motivational Interviewing Solution Focused Therapy  August Saucer, LCSW 11/19/2017  2:28 PM

## 2017-11-19 NOTE — Progress Notes (Signed)
D- Patient alert and oriented. Patient presents in a pleasant mood on assessment reporting that she slept "good" last night and had some complaints of ear pain. Patient requested pain medication from this writer. Patient stated to this writer that she is feeling "hopeless" and rates her depression a "8/10" because of these feelings. Patient denies SI, HI, AVH, at this time. Patient also denies anxiety. Patient's goals for today is to "get my mind situated, thinking positive", in which she will "pray" in order to accomplish her goals.  A- Scheduled medications administered to patient, per MD orders. Support and encouragement provided.  Routine safety checks conducted every 15 minutes.  Patient informed to notify staff with problems or concerns.  R- No adverse drug reactions noted. Patient contracts for safety at this time. Patient compliant with medications and treatment plan. Patient receptive, calm, and cooperative. Patient interacts well with others on the unit.  Patient remains safe at this time.

## 2017-11-19 NOTE — Progress Notes (Addendum)
[facility] MD Progress Note  11/19/2017 3:59 PM Teresa Jefferson  MRN:  [id number] Subjective:   I'm ok , ear and neck hurts",  Pt reports feeling better, less depressed, reports some pain L ear, neck , ENT input appreciated. ENT  started abx eardrops, suspected referred pain. Pt anxious but denies AVH. Denies Si/HI. On subutex. Pt requesting 5pm Neurontin at bedtime.   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 Essential hypertension [I10] 11/17/2017  . Asthma [J45.909] 11/17/2017  . 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 Cocaine use disorder, severe, dependence ([place]) [F14.20] 11/17/2017  . Opioid use disorder, severe, dependence ([place]) [F11.20] 11/17/2017  . Severe recurrent major depression without psychotic features ([place]) [F33.2] 11/16/2017  . Opiate abuse, continuous ([place]) [F11.10] 11/16/2017  . Cocaine abuse ([place]) [F14.10] 11/16/2017  . Suicidal ideation [R45.851] 11/16/2017  . Chronic pain [G89.29] 11/16/2017   Total Time spent with patient: 25 min  Past Psychiatric History: no new info  Past Medical History:  Past Medical History:  Diagnosis Date  . Bipolar 1 disorder ([place])   . Depression   . Hypertension   . Neuropathy   . Schizophrenia ([place])   . Thyroid disease     Past Surgical History:  Procedure Laterality Date  . ABDOMINAL HYSTERECTOMY    . KNEE SURGERY Right   . TONSILLECTOMY     Family History: History reviewed. No pertinent family history. Family Psychiatric  History: no new info Social History:  Social History   Substance and Sexual Activity  Alcohol Use No   Comment: occ     Social History   Substance and Sexual Activity  Drug Use Yes   Comment: crack and opiates    Social History   Socioeconomic History  . Marital status: Legally Separated    Spouse name: Not on file  . Number of children: Not on  file  . Years of education: Not on file  . Highest education level: Not on file  Occupational History  . Not on file  Social Needs  . Financial resource strain: Not on file  . Food insecurity:    Worry: Not on file    Inability: Not on file  . Transportation needs:    Medical: Not on file    Non-medical: Not on file  Tobacco Use  . Smoking status: Never Smoker  . Smokeless tobacco: Never Used  Substance and Sexual Activity  . Alcohol use: No    Comment: occ  . Drug use: Yes    Comment: crack and opiates  . Sexual activity: Not on file  Lifestyle  . Physical activity:    Days per week: Not on file    Minutes per session: Not on file  . Stress: Not on file  Relationships  . Social connections:    Talks on phone: Not on file    Gets together: Not on file    Attends religious service: Not on file    Active member of club or organization: Not on file    Attends meetings of clubs or organizations: Not on file    Relationship status: Not on file  Other Topics Concern  . Not on file  Social History Narrative  . Not on file   Additional Social History:  Sleep: Fair  Appetite:  Fair  Current Medications: Current Facility-Administered Medications  Medication Dose Route Frequency Provider Last Rate Last Dose  . acetaminophen (TYLENOL) tablet 650 mg  650 mg Oral Q6H PRN [name], [name], MD   650 mg at 11/19/17 1125  . albuterol (PROVENTIL HFA;VENTOLIN HFA) 108 (90 Base) MCG/ACT inhaler 1-2 puff  1-2 puff Inhalation Q6H PRN [name], MD      . alum & mag hydroxide-simeth (MAALOX/MYLANTA) 200-200-20 MG/5ML suspension 30 mL  30 mL Oral Q4H PRN [name], [name], MD      . buprenorphine-naloxone (SUBOXONE) 8-2 mg per SL tablet 1 tablet  1 tablet Sublingual BID [name], [name], MD   1 tablet at 11/19/17 [phone]  . carbamide peroxide (DEBROX) 6.5 % OTIC (EAR) solution 5 drop  5 drop Right EAR BID [name], MD   5 drop at 11/19/17 0823  .  gabapentin (NEURONTIN) capsule 300 mg  300 mg Oral TID [name], [name], MD   300 mg at 11/19/17 1125  . hydrOXYzine (ATARAX/VISTARIL) tablet 50 mg  50 mg Oral TID PRN [name], [name], MD      . ibuprofen (ADVIL,MOTRIN) tablet 600 mg  600 mg Oral Q6H PRN [name], MD   600 mg at 11/19/17 0825  . lamoTRIgine (LAMICTAL) tablet 25 mg  25 mg Oral Daily [name], MD   25 mg at 11/19/17 8416  . lisinopril (PRINIVIL,ZESTRIL) tablet 5 mg  5 mg Oral Daily [name], MD   5 mg at 11/19/17 6063  . loratadine (CLARITIN) tablet 10 mg  10 mg Oral Daily [name], MD   10 mg at 11/19/17 0160  . magnesium hydroxide (MILK OF MAGNESIA) suspension 30 mL  30 mL Oral Daily PRN [name], [name], MD      . ofloxacin (OCUFLOX) 0.3 % ophthalmic solution 3 drop  3 drop Both EARS QID [name], MD   3 drop at 11/19/17 1300  . QUEtiapine (SEROQUEL) tablet 400 mg  400 mg Oral QHS [name], [name], MD   400 mg at 11/18/17 2113    Lab Results:  Results for orders placed or performed during the hospital encounter of 11/16/17 (from the past 48 hour(s))  Potassium     Status: None   Collection Time: 11/18/17  6:45 AM  Result Value Ref Range   Potassium 3.9 3.5 - 5.1 mmol/L    Comment: HEMOLYSIS AT THIS LEVEL MAY AFFECT RESULT Performed at [facility], [address] [place], [place] [address]   Magnesium     Status: None   Collection Time: 11/18/17  6:45 AM  Result Value Ref Range   Magnesium 2.1 1.7 - 2.4 mg/dL    Comment: Performed at [facility], [address] [place], [place] [address]    Blood Alcohol level:  Lab Results  Component Value Date   [facility] <10 11/16/2017   ETH <10 22/06/5425    Metabolic Disorder Labs: Lab Results  Component Value Date   HGBA1C 6.1 (H) 11/16/2017   MPG 128.37 11/16/2017   No results found for: PROLACTIN Lab Results  Component Value Date   CHOL 164 11/16/2017   TRIG 117 11/16/2017   HDL 60 11/16/2017   CHOLHDL 2.7 11/16/2017    VLDL 23 11/16/2017   LDLCALC 81 11/16/2017    Physical Findings: AIMS:  , ,  ,  ,    CIWA:    COWS:     Musculoskeletal: Strength & Muscle  Tone: within normal limits Gait & Station: normal Patient leans:   Psychiatric Specialty Exam: Physical Exam  Nursing note and vitals reviewed.   ROS  Blood pressure 136/81, pulse 77, temperature 97.9 F (36.6 C), temperature source Oral, resp. rate 18, height 5\' 2"  (1.575 m), weight 78 kg (172 lb), SpO2 100 %.Body mass index is 31.46 kg/m.  General Appearance: appears older than stated age  Eye Contact:  Good  Speech:  Normal Rate  Volume:  Normal  Mood:  Depressed, Hopeless   Affect:  Congruent, anxious  Thought Process:  Coherent and Linear  Orientation:  Full (Time, Place, and Person)  Thought Content:  Logical  Suicidal Thoughts:  pt denies active SI  Homicidal Thoughts:  pt denies active SI  Memory:  intact  Judgement:  Fair  Insight:  Fair  Psychomotor Activity:  Normal  Concentration:  Concentration: Fair  Recall:  [place] of Knowledge:  Fair  Language:  Good  Akathisia:  No  AIMS (if indicated):   0  Assets:  Communication Skills Desire for Improvement  ADL's:  Intact  Cognition:  WNL  Sleep:  Number of Hours: 7.5             Treatment Plan Summary: Substance-induced Depression;  History of Bipolar Disorder; Anxiety disorder, unspecified Opioid use disorder, severe Cocaine use disorder, severe with associated withdrawal symptoms Hypokalemia (potassium 2.7 upon admission)likely due to poor nutrition and use of HCTZ (was intermittently taking combo Lisinopril-HCTZ as outpt) Chronic pain HTN Asthma/Seasonal Allergies Right ear wax causing decrease hearing Fever Blister   Daily contact with patient to assess and evaluate symptoms and progress in treatment and Medication management. Pt anxiouis, depressed,   Substance-induced Depression;  History of Bipolar Disorder; Anxiety disorder, unspecified. Mood  improving slowly. Pt anxiouis, depressed, reporting ENT issues.  -Continue seroquel ER 400 mg nightly -lamictal just started. -QTc 467   Opioid use disorder, severe -suboxone 8-2 mg BID  Cocaine use disorder, severe with associated withdrawal symptoms -monitor BP -hydroxyzine 50 mg TID PRN anxiety    Chronic pain, neuropathic -gabapentin 300 mg TID, Pt requesting 5pm Neurontin at bedtime, will change like that.     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[place]) Long Term Goal(s): Improvement in symptoms so as ready for discharge  Short Term Goals: Ability to identify changes in lifestyle to reduce recurrence of condition will improve, Ability to verbalize feelings will improve and Ability to identify triggers associated with substance abuse/mental health issues will improve  Physician Treatment Plan for Secondary Diagnosis: Principal Problem:   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[place]) Active Problems:   Severe recurrent major depression without psychotic features ([place])   Essential hypertension   Asthma   Cocaine use disorder, severe, dependence ([place])   Opioid use disorder, severe, dependence ([place])  Long Term Goal(s): Improvement in symptoms so as ready for discharge  Short Term Goals: Ability to verbalize feelings will improve, Ability to disclose and discuss suicidal ideas, Ability to identify and develop effective coping behaviors will improve, Compliance with prescribed medications will improve and Ability to identify triggers associated with substance abuse/mental health issues will improve  I certify that inpatient services furnished can reasonably be expected to improve the patient's condition.       [name], MD 11/19/2017, 3:59 PMPatient ID:  Teresa Jefferson, female   DOB: 05-27-1965, 52 y.o.   MRN: [id number]

## 2017-11-19 NOTE — Progress Notes (Signed)
CH was making clinical rounds and was asked by the patient to provide a Bible. Foraker promised to retrieve a Bible for the patient, who was outside when the Bible was left in her room.

## 2017-11-19 NOTE — Consult Note (Signed)
Teresa Jefferson, Teresa Jefferson [id number] [birth year] Riley Nearing, MD  Reason for Consult: Ear pain  Requesting Physician: [name], MD   Consulting Physician: Riley Nearing, MD  HPI: This 52 y.o. year old female was admitted on 11/16/2017 for Severe recurrent major depression without psychotic features . She complains of left sided ear pain for 2 days and more chronic issues of hearing difficulty with noted wax in the right ear. Has had tubes in the past. Denies ear drainage or any acute change in hearing since the ear pain started. Had some mild sore throat, but no fever. Also notes left sided neck pain and headache (generalized pressure in head), and says she tends to "pop" her neck a lot. Denies pain with chewing. Some right sided ear discomfort at times, but most of the issues are on the left.  Medications:  Current Facility-Administered Medications  Medication Dose Route Frequency Provider Last Rate Last Dose  . acetaminophen (TYLENOL) tablet 650 mg  650 mg Oral Q6H PRN [name], [name], MD   650 mg at 11/18/17 2114  . albuterol (PROVENTIL HFA;VENTOLIN HFA) 108 (90 Base) MCG/ACT inhaler 1-2 puff  1-2 puff Inhalation Q6H PRN [name], MD      . alum & mag hydroxide-simeth (MAALOX/MYLANTA) 200-200-20 MG/5ML suspension 30 mL  30 mL Oral Q4H PRN [name], [name], MD      . buprenorphine-naloxone (SUBOXONE) 8-2 mg per SL tablet 1 tablet  1 tablet Sublingual BID [name], [name], MD   1 tablet at 11/19/17 [phone]  . carbamide peroxide (DEBROX) 6.5 % OTIC (EAR) solution 5 drop  5 drop Right EAR BID [name], MD   5 drop at 11/19/17 0823  . gabapentin (NEURONTIN) capsule 300 mg  300 mg Oral TID [name], [name], MD   300 mg at 11/19/17 5329  . hydrOXYzine (ATARAX/VISTARIL) tablet 50 mg  50 mg Oral TID PRN [name], [name], MD      . ibuprofen (ADVIL,MOTRIN) tablet 600 mg  600 mg Oral Q6H PRN [name], MD   600 mg at 11/19/17 0825  . lamoTRIgine (LAMICTAL) tablet 25 mg  25 mg Oral Daily [name], MD   25 mg at 11/19/17 9242  . lisinopril (PRINIVIL,ZESTRIL) tablet 5 mg  5 mg Oral Daily [name], MD   5 mg at 11/19/17 6834  . loratadine (CLARITIN) tablet 10 mg  10 mg Oral Daily [name], MD   10 mg at 11/19/17 1962  . magnesium hydroxide (MILK OF MAGNESIA) suspension 30 mL  30 mL Oral Daily PRN [name], [name], MD      . QUEtiapine (SEROQUEL) tablet 400 mg  400 mg Oral QHS [name], [name], MD   400 mg at 11/18/17 2113  .  Medications Prior to Admission  Medication Sig Dispense Refill  . levothyroxine (SYNTHROID, LEVOTHROID) 125 MCG tablet Take 125 mcg by mouth daily before breakfast.    . lisinopril-hydrochlorothiazide (PRINZIDE,ZESTORETIC) 20-25 MG tablet Take 1 tablet by mouth daily.    . QUEtiapine (SEROQUEL) 400 MG tablet Take 1 tablet (400 mg total) by mouth at bedtime. 7 tablet 0  . traMADol (ULTRAM) 50 MG tablet Take 1 tablet (50 mg total) by mouth every 6 (six) hours as needed. 20 tablet 0    Allergies:  Allergies  Allergen Reactions  . Amoxicillin Rash  . Effexor [Venlafaxine] Rash    "whelps"    PMH:  Past Medical History:  Diagnosis Date  . Bipolar 1 disorder ([place])   .  Depression   . Hypertension   . Neuropathy   . Schizophrenia ([place])   . Thyroid disease     Fam Hx: History reviewed. No pertinent family history.  Soc Hx:  Social History   Socioeconomic History  . Marital status: Legally Separated    Spouse name: Not on file  . Number of children: Not on file  . Years of education: Not on file  . Highest education level: Not on file  Occupational History  . Not on file  Social Needs  . Financial resource strain: Not on file  . Food insecurity:    Worry: Not on file    Inability: Not on file  . Transportation needs:    Medical: Not on file    Non-medical: Not on file  Tobacco Use  . Smoking status: Never Smoker  . Smokeless tobacco: Never Used  Substance and Sexual Activity  . Alcohol use: No    Comment: occ  . Drug use:  Yes    Comment: crack and opiates  . Sexual activity: Not on file  Lifestyle  . Physical activity:    Days per week: Not on file    Minutes per session: Not on file  . Stress: Not on file  Relationships  . Social connections:    Talks on phone: Not on file    Gets together: Not on file    Attends religious service: Not on file    Active member of club or organization: Not on file    Attends meetings of clubs or organizations: Not on file    Relationship status: Not on file  . Intimate partner violence:    Fear of current or ex partner: Not on file    Emotionally abused: Not on file    Physically abused: Not on file    Forced sexual activity: Not on file  Other Topics Concern  . Not on file  Social History Narrative  . Not on file    PSH:  Past Surgical History:  Procedure Laterality Date  . ABDOMINAL HYSTERECTOMY    . KNEE SURGERY Right   . TONSILLECTOMY    . Procedures since admission: No admission procedures for hospital encounter.  ROS: Review of systems normal other than 12 systems except per HPI.  PHYSICAL EXAM  Vitals: Blood pressure 136/81, pulse 77, temperature 97.9 F (36.6 C), temperature source Oral, resp. rate 18, height 5\' 2"  (1.575 m), weight 172 lb (78 kg), SpO2 100 %.. General: Well-developed, Well-nourished in no acute distress Mood: Mood and affect well adjusted, pleasant and cooperative. Orientation: Grossly alert and oriented. Vocal Quality: No hoarseness. Communicates verbally. head and Face: NCAT. No facial asymmetry. No visible skin lesions. No significant facial scars. No tenderness with sinus percussion. Facial strength normal and symmetric. Ears: External ears with normal landmarks, no lesions. External auditory canals free of infection, note right sided partial cerumen impaction near TM, left canal clear with no swelling or cerumen. Maybe some minimal canal erythema at most. Right tympanic membranes partially obscured by cerumen, but no gross  infection. Left TM with chronic sclerotic change but no erythema and no visible fluid. Hearing: Speech reception grossly normal, maybe slightly decreased. Weber lateralizes to right ear with bone=air on the right with Rinne test, air>bone on the left, consistent with probable right sided conductive impairment from visible cerumen. Nose: External nose normal with midline dorsum and no lesions or deformity. Nasal Cavity reveals essentially midline septum with normal inferior turbinates. No significant mucosal congestion  or erythema. Nasal secretions are minimal and clear. No polyps seen on anterior rhinoscopy. Oral Cavity/ Oropharynx: Lips are normal with no lesions. Teeth no frank dental caries. Gingiva healthy with no lesions or gingivitis. Oropharynx including tongue, buccal mucosa, floor of mouth, hard and soft palate, uvula and posterior pharynx free of exudates,or lesions with normal symmetry and hydration. Minor erythema at most. No ulcers or gross infection. Indirect Laryngoscopy/Nasopharyngoscopy: Visualization of the larynx, hypopharynx and nasopharynx is not possible in this setting with routine examination. Neck: Supple and symmetric with no palpable masses, or crepitance. The trachea is midline. Thyroid gland is soft, nontender and symmetric with no masses or enlargement. Parotid and submandibular glands are soft, nontender and symmetric, without masses. Tender over the left sternocleidomastoid, which feels a little tight, but no evidence of cellulitis or lymphadenopathy. Lymphatic: Cervical lymph nodes are without palpable lymphadenopathy or tenderness. Respiratory: Normal respiratory effort without labored breathing. Cardiovascular: Carotid pulse shows regular rate and rhythm Neurologic: Cranial Nerves II through XII are grossly intact. Eyes: Gaze and Ocular Motility are grossly normal. PERRLA. No visible nystagmus.  MEDICAL DECISION MAKING: Data Review:  Results for orders placed or  performed during the hospital encounter of 11/16/17 (from the past 48 hour(s))  Potassium     Status: None   Collection Time: 11/18/17  6:45 AM  Result Value Ref Range   Potassium 3.9 3.5 - 5.1 mmol/L    Comment: HEMOLYSIS AT THIS LEVEL MAY AFFECT RESULT Performed at [facility], [address] [place], [place] [address]   Magnesium     Status: None   Collection Time: 11/18/17  6:45 AM  Result Value Ref Range   Magnesium 2.1 1.7 - 2.4 mg/dL    Comment: Performed at [facility], [address] [place],  [address]  . No results found..   ASSESSMENT: No definite ear infection on exam causing otalgia. She does have some right sided cerumen impaction, but this is near the TM and I do not have equipment for removal of deep cerumen impaction in this setting. I suspect the left sided ear pain could be referred pain from neck issues.  PLAN: Recommend Debrox irrigation daily to right ear to help soft and clear cerumen. I am happy to see her in my office after discharge where cerumen could be dis-impacted under a microscope with suctioning equipment. We could also assess her hearing more completely. At most there is some minor canal redness on the left of questionable significance. Could empirically cover with Ofloxacin 4 drops AU BID, covering the right ear as well to prophylax against otitis externa from the irrigation attempts.   As for the pain she is quite well covered with medications, and anti-inflammatories and warm compresses to the neck would be the focus of managing neck pain. At the admitting physician's discretion a muscle relaxant may be of benefit, and a short course of prednisone would provide more anti-inflammatory relief. If neck pain persists she may need further consultation regarding this issue, either as an outpatient or through inpatient consultation (either ortho or neurosurgery).    Riley Nearing, MD 11/19/2017 11:14 AM

## 2017-11-19 NOTE — Plan of Care (Signed)
Patient is doing well with her medicines and expressed no concerns, verbally contract for safety, socializing with peers and participate in groups, takes her medicines with out any noticeable side effects, denies SI/HI and no signs of AVH noted , sleep long hours no distress, 15 minute rounding is maintained. Problem: Education: Goal: Knowledge of [place] [place] Education information/materials will improve Outcome: Progressing Goal: Emotional status will improve Outcome: Progressing Goal: Mental status will improve Outcome: Progressing Goal: Verbalization of understanding the information provided will improve Outcome: Progressing   Problem: Safety: Goal: Periods of time without injury will increase Outcome: Progressing   Problem: Safety: Goal: Ability to disclose and discuss suicidal ideas will improve Outcome: Progressing   Problem: Safety: Goal: Ability to remain free from injury will improve Outcome: Progressing

## 2017-11-20 ENCOUNTER — Inpatient Hospital Stay: Payer: Medicaid Other

## 2017-11-20 DIAGNOSIS — F1120 Opioid dependence, uncomplicated: Secondary | ICD-10-CM

## 2017-11-20 DIAGNOSIS — F3289 Other specified depressive episodes: Secondary | ICD-10-CM

## 2017-11-20 DIAGNOSIS — F1420 Cocaine dependence, uncomplicated: Secondary | ICD-10-CM

## 2017-11-20 DIAGNOSIS — F1994 Other psychoactive substance use, unspecified with psychoactive substance-induced mood disorder: Secondary | ICD-10-CM

## 2017-11-20 LAB — CBC WITH DIFFERENTIAL/PLATELET
BASOS ABS: 0.1 10*3/uL (ref 0–0.1)
Basophils Relative: 1 %
Eosinophils Absolute: 0.1 10*3/uL (ref 0–0.7)
Eosinophils Relative: 2 %
HEMATOCRIT: 39.4 % (ref 35.0–47.0)
HEMOGLOBIN: 13.1 g/dL (ref 12.0–16.0)
LYMPHS PCT: 18 %
Lymphs Abs: 1 10*3/uL (ref 1.0–3.6)
MCH: 28 pg (ref 26.0–34.0)
MCHC: 33.2 g/dL (ref 32.0–36.0)
MCV: 84.5 fL (ref 80.0–100.0)
Monocytes Absolute: 0.4 10*3/uL (ref 0.2–0.9)
Monocytes Relative: 7 %
NEUTROS ABS: 4.3 10*3/uL (ref 1.4–6.5)
NEUTROS PCT: 72 %
PLATELETS: 197 10*3/uL (ref 150–440)
RBC: 4.66 MIL/uL (ref 3.80–5.20)
RDW: 14.7 % — ABNORMAL HIGH (ref 11.5–14.5)
WBC: 6 10*3/uL (ref 3.6–11.0)

## 2017-11-20 LAB — GLUCOSE, CAPILLARY: Glucose-Capillary: 99 mg/dL (ref 70–99)

## 2017-11-20 MED ORDER — BUPRENORPHINE HCL-NALOXONE HCL 8-2 MG SL SUBL
1.0000 | SUBLINGUAL_TABLET | Freq: Every day | SUBLINGUAL | Status: DC
  Administered 2017-11-21 – 2017-12-06 (×16): 1 via SUBLINGUAL
  Filled 2017-11-20 (×16): qty 1

## 2017-11-20 MED ORDER — ONDANSETRON 4 MG PO TBDP
4.0000 mg | ORAL_TABLET | Freq: Three times a day (TID) | ORAL | Status: DC | PRN
Start: 2017-11-20 — End: 2017-12-06
  Administered 2017-11-20 – 2017-12-01 (×6): 4 mg via ORAL
  Filled 2017-11-20 (×6): qty 1

## 2017-11-20 MED ORDER — IBUPROFEN 600 MG PO TABS
600.0000 mg | ORAL_TABLET | Freq: Four times a day (QID) | ORAL | Status: DC | PRN
  Administered 2017-11-20 – 2017-12-06 (×37): 600 mg via ORAL
  Filled 2017-11-20 (×37): qty 1

## 2017-11-20 MED ORDER — POLYETHYLENE GLYCOL 3350 17 G PO PACK
17.0000 g | PACK | Freq: Every day | ORAL | Status: DC
  Administered 2017-11-20: 17 g via ORAL
  Filled 2017-11-20: qty 1

## 2017-11-20 MED ORDER — POLYETHYLENE GLYCOL 3350 17 G PO PACK: 17.0000 g | PACK | Freq: Every day | ORAL | Status: DC | PRN

## 2017-11-20 MED ORDER — SENNOSIDES-DOCUSATE SODIUM 8.6-50 MG PO TABS
2.0000 | ORAL_TABLET | Freq: Two times a day (BID) | ORAL | Status: DC
  Administered 2017-11-21 – 2017-11-22 (×3): 2 via ORAL
  Filled 2017-11-20 (×5): qty 2

## 2017-11-20 MED ORDER — HYDROCHLOROTHIAZIDE 25 MG PO TABS
25.0000 mg | ORAL_TABLET | Freq: Every day | ORAL | Status: DC
Start: 2017-11-20 — End: 2017-12-06
  Administered 2017-11-20 – 2017-12-06 (×17): 25 mg via ORAL
  Filled 2017-11-20 (×17): qty 1

## 2017-11-20 NOTE — Progress Notes (Signed)
Patient seen and examined.  Case discussed with Dr. Audie Box with psychiatry. full note to follow

## 2017-11-20 NOTE — Progress Notes (Signed)
D: Patient stated slept good last night .Stated appetite fair and energy level  Is normal. Stated concentration is good . Stated on Depression scale 5 , hopeless 0 and anxiety 4 .( low 0-10 high) Denies suicidal  homicidal ideations  .  No auditory hallucinations  No pain concerns . Appropriate ADL'S. Interacting with peers and staff.Patient aware and understanding of information received from staff . Improving with emotional and mental status . Voice of no safety concerns . Denies suicidal  Ideations stated her goal today was " Stay Focused " Patient has had somatic complaints through out shift . Continues to compliant of abdominal  Pain. Lower leg swelling , back and leg pain .  These concerns have been  addressed with MD and consult orded. A: Encourage patient participation with unit programming . Instruction  Given on  Medication , verbalize understanding. R: Voice no other concerns. Staff continue to monitor

## 2017-11-20 NOTE — Plan of Care (Signed)
Patient is improving in all areas and doing well in the unit socializing and participating with peers without any issues, safety is maintained rounding every 15 minutes, denies any Si/hi no distress. Problem: Education: Goal: Knowledge of [facility] Education information/materials will improve Outcome: Progressing Goal: Emotional status will improve Outcome: Progressing Goal: Mental status will improve Outcome: Progressing Goal: Verbalization of understanding the information provided will improve Outcome: Progressing   Problem: Safety: Goal: Periods of time without injury will increase Outcome: Progressing   Problem: Safety: Goal: Ability to disclose and discuss suicidal ideas will improve Outcome: Progressing   Problem: Safety: Goal: Ability to remain free from injury will improve Outcome: Progressing

## 2017-11-20 NOTE — [facility] Group Notes (Signed)
[facility] Group Notes:  (Nursing/MHT/Case Management/Adjunct)  Date:  11/20/2017  Time:  9:13 PM  Type of Therapy:  Group Therapy  Participation Level:  Active  Participation Quality:  Appropriate  Affect:  Appropriate  Cognitive:  Appropriate  Insight:  Appropriate  Engagement in Group:  Engaged  Modes of Intervention:  Discussion  Summary of Progress/Problems:  Teresa Jefferson 11/20/2017, 9:13 PM

## 2017-11-20 NOTE — Plan of Care (Signed)
Patient aware and understanding of information received from staff . Improving with emotional and mental status . Voice of no safety concerns . Denies suicidal  ideations Problem: Safety: Goal: Ability to remain free from injury will improve 11/20/2017 1543 by Leodis Liverpool, RN Outcome: Progressing 11/20/2017 1517 by Leodis Liverpool, RN Outcome: Progressing   Problem: Safety: Goal: Ability to disclose and discuss suicidal ideas will improve 11/20/2017 1543 by Leodis Liverpool, RN Outcome: Progressing 11/20/2017 1517 by Leodis Liverpool, RN Outcome: Progressing   Problem: Safety: Goal: Periods of time without injury will increase 11/20/2017 1543 by Leodis Liverpool, RN Outcome: Progressing 11/20/2017 1517 by Leodis Liverpool, RN Outcome: Progressing   Problem: Education: Goal: Knowledge of [place] Education information/materials will improve 11/20/2017 1543 by Leodis Liverpool, RN Outcome: Progressing 11/20/2017 1517 by Leodis Liverpool, RN Outcome: Progressing Goal: Emotional status will improve 11/20/2017 1543 by Leodis Liverpool, RN Outcome: Progressing 11/20/2017 1517 by Leodis Liverpool, RN Outcome: Progressing Goal: Mental status will improve 11/20/2017 1543 by Leodis Liverpool, RN Outcome: Progressing 11/20/2017 1517 by Leodis Liverpool, RN Outcome: Progressing Goal: Verbalization of understanding the information provided will improve 11/20/2017 1543 by Leodis Liverpool, RN Outcome: Progressing 11/20/2017 1517 by Leodis Liverpool, RN Outcome: Progressing
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
## 2017-11-20 NOTE — Progress Notes (Signed)
Recreation Therapy Notes  Date: 11/20/2017  Time: 9:30 am   Location: Craft Room   Behavioral response: N/A   Intervention Topic:  Problem Solving  Discussion/Intervention: Patient did not attend group.   Clinical Observations/Feedback:  Patient did not attend group.     LRT/CTRS           11/20/2017 10:18 AM

## 2017-11-20 NOTE — Progress Notes (Signed)
Recreation Therapy Notes  INPATIENT RECREATION THERAPY ASSESSMENT  Patient Details Name: ROSAMARIA DONN MRN: [id number] DOB: 08-Sep-1965 Today's Date: 11/20/2017       Information Obtained From: Patient  Able to Participate in Assessment/Interview: Yes  Patient Presentation: Responsive  Reason for Admission (Per Patient): Active Symptoms, Other (Comments)(Depression)  Patient Stressors: Relationship, Other (Comment)(Being homeless)  Coping Skills:   Isolation  Leisure Interests (2+):  Music - Listen  Frequency of Recreation/Participation: Monthly  Awareness of Community Resources:  No  Community Resources:     Current Use:    If no, Barriers?:    Expressed Interest in [employer] Information: No  County of Residence:  [place]  Patient Main Form of Transportation: Diplomatic Services operational officer  Patient Strengths:  Caring, giving, loving, good listener  Patient Identified Areas of Improvement:  Stop using drugs  Patient Goal for Hospitalization:  Work on me  Current SI (including self-harm):  No  Current HI:  No  Current AVH: No  Staff Intervention Plan: Group Attendance, Collaborate with Interdisciplinary Treatment Team  Consent to Intern Participation: N/A     11/20/2017, 3:10 PM

## 2017-11-20 NOTE — Consult Note (Signed)
[place] [facility] [place] NAME: Teresa Jefferson    MR#:  [id number]  DATE OF BIRTH:  05-13-1966  DATE OF ADMISSION:  11/16/2017  PRIMARY CARE PHYSICIAN: [facility] [place]   REQUESTING/REFERRING PHYSICIAN: [name], MD  CHIEF COMPLAINT:  No chief complaint on file.  HISTORY OF PRESENT ILLNESS:  Teresa Jefferson  is a 52 y.o. female with a known history of bipolar disorder, substance abuse was admitted as she wanted to be clean.  We are consulted for medical management as patient complained of abdominal pain lower extremity edema and hypertension.  Upon talking to patient she reports wanting to resume her hydrochlorothiazide, ibuprofen.  She does report some lower abdominal pain but feels more like a gassy sensation.  She does tell me that she had a bowel movement yesterday although she is not too sure if she is completely cleaned up. PAST MEDICAL HISTORY:   Past Medical History:  Diagnosis Date  . Bipolar 1 disorder ([place])   . Depression   . Hypertension   . Neuropathy   . Schizophrenia ([place])   . Thyroid disease     PAST SURGICAL HISTOIRY:   Past Surgical History:  Procedure Laterality Date  . ABDOMINAL HYSTERECTOMY    . KNEE SURGERY Right   . TONSILLECTOMY      SOCIAL HISTORY:   Social History   Tobacco Use  . Smoking status: Never Smoker  . Smokeless tobacco: Never Used  Substance Use Topics  . Alcohol use: No    Comment: occ    FAMILY HISTORY:  History reviewed. No pertinent family history.  DRUG ALLERGIES:   Allergies  Allergen Reactions  . Amoxicillin Rash  . Effexor [Venlafaxine] Rash    "whelps"    REVIEW OF SYSTEMS:  CONSTITUTIONAL: No fever, fatigue or weakness.  EYES: No blurred or double vision.  EARS, NOSE, AND THROAT: No tinnitus or ear pain.  RESPIRATORY: No cough, shortness of breath, wheezing or hemoptysis.  CARDIOVASCULAR: No chest pain, orthopnea, edema.  GASTROINTESTINAL: No  nausea, vomiting, diarrhea.  She does report abdominal pain.  GENITOURINARY: No dysuria, hematuria.  ENDOCRINE: No polyuria, nocturia,  HEMATOLOGY: No anemia, easy bruising or bleeding SKIN: No rash or lesion. MUSCULOSKELETAL: No joint pain or arthritis.   NEUROLOGIC: No tingling, numbness, weakness.  PSYCHIATRY: + anxiety or depression.  MEDICATIONS AT HOME:   Prior to Admission medications   Medication Sig Start Date End Date Taking? Authorizing Provider  levothyroxine (SYNTHROID, LEVOTHROID) 125 MCG tablet Take 125 mcg by mouth daily before breakfast.    [provider]  lisinopril-hydrochlorothiazide (PRINZIDE,ZESTORETIC) 20-25 MG tablet Take 1 tablet by mouth daily.    [provider]  QUEtiapine (SEROQUEL) 400 MG tablet Take 1 tablet (400 mg total) by mouth at bedtime. 11/09/16   Darel Hong, MD  traMADol (ULTRAM) 50 MG tablet Take 1 tablet (50 mg total) by mouth every 6 (six) hours as needed. 01/30/16   Daymon Larsen, MD      VITAL SIGNS:  Blood pressure (!) 137/95, pulse 81, temperature 98.5 F (36.9 C), temperature source Oral, resp. rate 16, height 5\' 2"  (1.575 m), weight 78 kg (172 lb), SpO2 100 %. PHYSICAL EXAMINATION:  GENERAL:  52 y.o.-year-old patient lying in the bed with no acute distress.  EYES: Pupils equal, round, reactive to light and accommodation. No scleral icterus. Extraocular muscles intact.  HEENT: Head atraumatic, normocephalic. Oropharynx and nasopharynx clear.  NECK:  Supple, no jugular  venous distention. No thyroid enlargement, no tenderness.  LUNGS: Normal breath sounds bilaterally, no wheezing, rales,rhonchi or crepitation. No use of accessory muscles of respiration.  CARDIOVASCULAR: S1, S2 normal. No murmurs, rubs, or gallops.  ABDOMEN: Soft, minimally tender, nondistended. Bowel sounds present. No organomegaly or mass.  EXTREMITIES: No pedal edema, cyanosis, or clubbing.  NEUROLOGIC: Cranial nerves II through XII are intact.  Muscle strength 5/5 in all extremities. Sensation intact. Gait not checked.  PSYCHIATRIC: The patient is alert and oriented x 3.  SKIN: No obvious rash, lesion, or ulcer.  LABORATORY PANEL:   CBC Recent Labs  Lab 11/20/17 0952  WBC 6.0  HGB 13.1  HCT 39.4  PLT 197   ------------------------------------------------------------------------------------------------------------------  Chemistries  Recent Labs  Lab 11/16/17 1132 11/18/17 0645  NA 142  --   K 2.7* 3.9  CL 110  --   CO2 24  --   GLUCOSE 170*  --   BUN 20  --   CREATININE 0.75  --   CALCIUM 9.0  --   MG  --  2.1  AST 25  --   ALT 15  --   ALKPHOS 62  --   BILITOT 0.6  --    ------------------------------------------------------------------------------------------------------------------  IMPRESSION AND PLAN:  51 year old female seen in consultation for abdominal pain  *Abdominal pain - Hard to know if this is somatic and/or real.  Will get KUB-to evaluate for any pathology especially residual stool. she may be constipated as she does use heavy use of narcotics in the past. -Avoid narcotics if possible.  And is requesting ibuprofen which I have resumed she does not report any active peptic ulcer disease  *Hypertension -She is on lisinopril we will go out and add hydrochlorothiazide as per her request this should also help her blood pressure as that is been running somewhat higher  *Hypothyroidism Check TSH continue Synthroid  *Depression Continue Seroquel    All the records are reviewed and case discussed with Consulting provider. Management plans discussed with the patient, Psychiatry and they are in agreement.  CODE STATUS: FULL CODE  TOTAL TIME TAKING CARE OF THIS PATIENT: 35 minutes.    [name] M.D on 11/20/2017 at 4:08 PM  Between 7am to 6pm - Pager - [phone]  After 6pm go to [email or web address] - Proofreader  [facility] [place] [facility]  Office  [phone]  CC:  Primary care Physician: [facility]   Note: This dictation was prepared with Dragon dictation along with smaller phrase technology. Any transcriptional errors that result from this process are unintentional.

## 2017-11-21 DIAGNOSIS — F1994 Other psychoactive substance use, unspecified with psychoactive substance-induced mood disorder: Secondary | ICD-10-CM

## 2017-11-21 DIAGNOSIS — F3289 Other specified depressive episodes: Secondary | ICD-10-CM

## 2017-11-21 DIAGNOSIS — F1420 Cocaine dependence, uncomplicated: Secondary | ICD-10-CM

## 2017-11-21 DIAGNOSIS — F1120 Opioid dependence, uncomplicated: Secondary | ICD-10-CM

## 2017-11-21 LAB — BASIC METABOLIC PANEL
Anion gap: 7 (ref 5–15)
BUN: 21 mg/dL — AB (ref 6–20)
CHLORIDE: 101 mmol/L (ref 98–111)
CO2: 31 mmol/L (ref 22–32)
CREATININE: 0.64 mg/dL (ref 0.44–1.00)
Calcium: 9 mg/dL (ref 8.9–10.3)
GFR calc non Af Amer: >60 mL/min (ref >60)
Glucose, Bld: 124 mg/dL — ABNORMAL HIGH (ref 70–99)
Potassium: 4.5 mmol/L (ref 3.5–5.1)
Sodium: 139 mmol/L (ref 135–145)

## 2017-11-21 LAB — CBC
HEMATOCRIT: 39 % (ref 35.0–47.0)
HEMOGLOBIN: 12.9 g/dL (ref 12.0–16.0)
MCH: 28 pg (ref 26.0–34.0)
MCHC: 33.1 g/dL (ref 32.0–36.0)
MCV: 84.4 fL (ref 80.0–100.0)
Platelets: 200 10*3/uL (ref 150–440)
RBC: 4.62 MIL/uL (ref 3.80–5.20)
RDW: 14.5 % (ref 11.5–14.5)
WBC: 5.2 10*3/uL (ref 3.6–11.0)

## 2017-11-21 MED ORDER — POLYETHYLENE GLYCOL 3350 17 G PO PACK
17.0000 g | PACK | Freq: Two times a day (BID) | ORAL | Status: DC
  Administered 2017-11-21 – 2017-11-22 (×3): 17 g via ORAL
  Filled 2017-11-21 (×3): qty 1

## 2017-11-21 NOTE — Plan of Care (Signed)
Needy, attention seeking, medication seeking, demanding, preoccupied with a lot of somatic complaints about GI problems; anxious, restless, difficult to console.  Patient slept for Estimated Hours of 6.45; Precautionary checks every 15 minutes for safety maintained, room free of safety hazards, patient sustains no injury or falls during this shift.  Problem: Education: Goal: Emotional status will improve Outcome: Progressing Goal: Verbalization of understanding the information provided will improve Outcome: Progressing   Problem: Safety: Goal: Periods of time without injury will increase Outcome: Progressing   Problem: Safety: Goal: Ability to remain free from injury will improve Outcome: Progressing

## 2017-11-21 NOTE — B[facility] Group Notes (Signed)
11/21/2017 1PM  Type of Therapy/Topic:  Group Therapy:  Feelings about Diagnosis  Participation Level:  Active   Description of Group:   This group will allow patients to explore their thoughts and feelings about diagnoses they have received. Patients will be guided to explore their level of understanding and acceptance of these diagnoses. Facilitator will encourage patients to process their thoughts and feelings about the reactions of others to their diagnosis and will guide patients in identifying ways to discuss their diagnosis with significant others in their lives. This group will be process-oriented, with patients participating in exploration of their own experiences, giving and receiving support, and processing challenge from other group members.   Therapeutic Goals: 1. Patient will demonstrate understanding of diagnosis as evidenced by identifying two or more symptoms of the disorder 2. Patient will be able to express two feelings regarding the diagnosis 3. Patient will demonstrate their ability to communicate their needs through discussion and/or role play  Summary of Patient Progress: Actively and appropriately engaged in the group. Patient practiced active listening when interacting with the facilitator and other group members. Teresa Jefferson spoke about experiencing sadness and withdrawing herself. She dosed off several times during the group but stayed the entire time. Patient is still in the process of obtaining treatment goals.        Therapeutic Modalities:   Cognitive Behavioral Therapy Brief Therapy Feelings Identification    Darin Engels, [place] 11/21/2017 2:41 PM

## 2017-11-21 NOTE — Plan of Care (Signed)
Patient aware and understanding of information received from staff . Improving with emotional and mental status . Voice of no safety concerns . Denies suicidal  ideations  Problem: Education: Goal: Knowledge of [facility] Education information/materials will improve Outcome: Progressing Goal: Emotional status will improve Outcome: Progressing Goal: Mental status will improve Outcome: Progressing Goal: Verbalization of understanding the information provided will improve Outcome: Progressing   Problem: Safety: Goal: Periods of time without injury will increase Outcome: Progressing   Problem: Safety: Goal: Ability to disclose and discuss suicidal ideas will improve Outcome: Progressing   Problem: Safety: Goal: Ability to remain free from injury will improve Outcome: Progressing

## 2017-11-21 NOTE — Progress Notes (Addendum)
D:Patient aware and understanding of information received from staff . Improving with emotional and mental status . Voice of no safety concerns . Denies suicidal ideations D: Patient stated slept good last night .Stated appetite is good and energy level  Is normal. Stated concentration is good . Stated on Depression scale  7, hopeless 6 and anxiety  6.( low 0-10 high) Denies suicidal  homicidal ideations . No auditory hallucinations  No pain concerns . Appropriate ADL'S. Interacting with peers and staff.  A: Encourage patient participation with unit programming . Instruction  Given on  Medication , verbalize understanding. R: Voice no other concerns. Staff continue to monitor .

## 2017-11-21 NOTE — Progress Notes (Signed)
Patient ID: Teresa Jefferson, female   DOB: 1965-12-08, 52 y.o.   MRN: [id number] Antiemetic, Zofran 4 mg ODT given for c/o nausea

## 2017-11-21 NOTE — Progress Notes (Signed)
Recreation Therapy Notes  Date: 11/21/2017  Time: 9:30 am  Location: Craft Room  Behavioral response: Appropriate   Intervention Topic: Self-esteem  Discussion/Intervention:  Group content today was focused on self-esteem. Patient defined self-esteem and where it comes form. The group described reasons self-esteem is important. Individuals stated things that impact self-esteem and positive ways to improve self-esteem. The group participated in the intervention "Improving Me" where patients were able to create a collage of positive things that makes them who they are. Clinical Observations/Feedback:  Patient came to group and was focused on what her peers and staff had to say about self-esteem. Individual was social with peers while participating in the intervention.    LRT/CTRS            11/21/2017 10:48 AM

## 2017-11-21 NOTE — Progress Notes (Signed)
[facility] NAME: Teresa Jefferson    MR#:  [id number]  DATE OF BIRTH:  09-May-1966  SUBJECTIVE:  patient ambulating in the hallways downstairs in the behavioral unit. She is eating okay. She did have a bowel movement yesterday. Has quite a few nonspecific symptoms  REVIEW OF SYSTEMS:   Review of Systems  Constitutional: Negative for chills, fever and weight loss.  HENT: Negative for ear discharge, ear pain and nosebleeds.   Eyes: Negative for blurred vision, pain and discharge.  Respiratory: Negative for sputum production, shortness of breath, wheezing and stridor.   Cardiovascular: Negative for chest pain, palpitations, orthopnea and PND.  Gastrointestinal: Negative for abdominal pain, diarrhea, nausea and vomiting.  Genitourinary: Negative for frequency and urgency.  Musculoskeletal: Negative for back pain and joint pain.  Neurological: Negative for sensory change, speech change, focal weakness and weakness.  Psychiatric/Behavioral: Negative for depression and hallucinations. The patient is not nervous/anxious.    Tolerating Diet:yes Tolerating PT:  ambulatory  DRUG ALLERGIES:   Allergies  Allergen Reactions  . Amoxicillin Rash  . Effexor [Venlafaxine] Rash    "whelps"    VITALS:  Blood pressure 134/86, pulse 95, temperature 97.9 F (36.6 C), resp. rate 16, height 5\' 2"  (1.575 m), weight 78 kg (172 lb), SpO2 100 %.  PHYSICAL EXAMINATION:   Physical Exam  GENERAL:  52 y.o.-year-old patient lying in the bed with no acute distress.  EYES: Pupils equal, round, reactive to light and accommodation. No scleral icterus. Extraocular muscles intact.  HEENT: Head atraumatic, normocephalic. Oropharynx and nasopharynx clear.  NECK:  Supple, no jugular venous distention. No thyroid enlargement, no tenderness.  LUNGS: Normal breath sounds bilaterally, no wheezing, rales, rhonchi. No use of accessory muscles of respiration.   CARDIOVASCULAR: S1, S2 normal. No murmurs, rubs, or gallops.  ABDOMEN: Soft, nontender, nondistended. Bowel sounds present. No organomegaly or mass.  EXTREMITIES: No cyanosis, clubbing or edema b/l.    NEUROLOGIC: Cranial nerves II through XII are intact. No focal Motor or sensory deficits b/l.   PSYCHIATRIC:  patient is alert and oriented x 3.  SKIN: No obvious rash, lesion, or ulcer.   LABORATORY PANEL:  CBC Recent Labs  Lab 11/21/17 0631  WBC 5.2  HGB 12.9  HCT 39.0  PLT 200    Chemistries  Recent Labs  Lab 11/16/17 1132 11/18/17 0645 11/21/17 0631  NA 142  --  139  K 2.7* 3.9 4.5  CL 110  --  101  CO2 24  --  31  GLUCOSE 170*  --  124*  BUN 20  --  21*  CREATININE 0.75  --  0.64  CALCIUM 9.0  --  9.0  MG  --  2.1  --   AST 25  --   --   ALT 15  --   --   ALKPHOS 62  --   --   BILITOT 0.6  --   --    Cardiac Enzymes No results for input(s): TROPONINI in the last 168 hours. RADIOLOGY:  Dg Abd 1 View  Result Date: 11/20/2017 CLINICAL DATA:  Abdominal pain.  Constipation. EXAM: ABDOMEN - 1 VIEW COMPARISON:  Radiographs dated 01/30/2016 FINDINGS: The bowel gas pattern is normal. There stool scattered throughout the nondistended colon without fecal impaction. Stomach and small bowel are not distended. Cholecystectomy. Bones are normal. IMPRESSION: Benign-appearing abdomen. Electronically Signed   By: Lorriane Shire M.D.   On: 11/20/2017 16:48  ASSESSMENT AND PLAN:  52 year old female seen in consultation for abdominal pain  *Abdominal pain - Hard to know if this is somatic and/or real.  - KUB neg for acute abnormality - she may be constipated as she does use heavy use of narcotics in the past. -Avoid narcotics if possible.  And is requesting ibuprofen which I have resumed she does not report any active peptic ulcer disease  *Hypertension -She is on lisinopril/ hydrochlorothiazide   *Hypothyroidism  continue Synthroid  *Depression Continue  Seroquel  Overall medically stable. Will sign off. Call us if needed.  Case discussed with Care Management/Social Worker. Management plans discussed with the patient, family and they are in agreement.  CODE STATUS: full  DVT Prophylaxis: ambulatory  TOTAL TIME TAKING CARE OF THIS PATIENT: *30* minutes.  >50% time spent on counselling and coordination of care  Note: This dictation was prepared with Dragon dictation along with smaller phrase technology. Any transcriptional errors that result from this process are unintentional.  Fritzi Mandes M.D on 11/21/2017 at 1:45 PM  Between 7am to 6pm - Pager - [phone]  After 6pm go to [email or web address] - password [email or web address] [facility]  Office  [phone]  CC: Primary care physician; [facility]: Teresa Jefferson, female   DOB: August 25, 1965, 52 y.o.   MRN: [id number]

## 2017-11-21 NOTE — [facility] Group Notes (Signed)
CSW Group Therapy Note  11/21/2017  Time:  0900  Type of Therapy and Topic: Group Therapy: Goals Group: SMART Goals    Participation Level:  Did Not Attend    Description of Group:   The purpose of a daily goals group is to assist and guide patients in setting recovery/wellness-related goals. The objective is to set goals as they relate to the crisis in which they were admitted. Patients will be using SMART goal modalities to set measurable goals. Characteristics of realistic goals will be discussed and patients will be assisted in setting and processing how one will reach their goal. Facilitator will also assist patients in applying interventions and coping skills learned in psycho-education groups to the SMART goal and process how one will achieve defined goal.    Therapeutic Goals:  -Patients will develop and document one goal related to or their crisis in which brought them into treatment.  -Patients will be guided by LCSW using SMART goal setting modality in how to set a measurable, attainable, realistic and time sensitive goal.  -Patients will process barriers in reaching goal.  -Patients will process interventions in how to overcome and successful in reaching goal.    Patient's Goal:  Pt was invited to attend group but chose not to attend. CSW will continue to encourage pt to attend group throughout their admission.     Therapeutic Modalities:  Motivational Interviewing  Cognitive Behavioral Therapy  Crisis Intervention Model  SMART goals setting  Alden Hipp, MSW, LCSW Clinical Social Worker 11/21/2017 9:54 AM
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
## 2017-11-22 DIAGNOSIS — F1994 Other psychoactive substance use, unspecified with psychoactive substance-induced mood disorder: Secondary | ICD-10-CM

## 2017-11-22 DIAGNOSIS — F1420 Cocaine dependence, uncomplicated: Secondary | ICD-10-CM

## 2017-11-22 DIAGNOSIS — F3289 Other specified depressive episodes: Secondary | ICD-10-CM

## 2017-11-22 DIAGNOSIS — F1120 Opioid dependence, uncomplicated: Secondary | ICD-10-CM

## 2017-11-22 LAB — T4, FREE: FREE T4: 0.48 ng/dL — AB (ref 0.82–1.77)

## 2017-11-22 LAB — OCCULT BLOOD X 1 CARD TO LAB, STOOL: Fecal Occult Bld: POSITIVE — AB

## 2017-11-22 MED ORDER — PEG 3350-KCL-NA BICARB-NACL 420 G PO SOLR
4000.0000 mL | Freq: Once | ORAL | Status: AC
  Administered 2017-11-22: 4000 mL via ORAL
  Filled 2017-11-22: qty 4000

## 2017-11-22 NOTE — Plan of Care (Signed)
Patient aware and understanding of information received from staff . Improving with emotional and mental status . Voice of no safety concerns . Denies suicidal  ideations Attending unit programing . Interacting  with  peers and staff   Problem: Education: Goal: Knowledge of [facility] Education information/materials will improve Outcome: Progressing Goal: Emotional status will improve Outcome: Progressing Goal: Mental status will improve Outcome: Progressing Goal: Verbalization of understanding the information provided will improve Outcome: Progressing   Problem: Safety: Goal: Periods of time without injury will increase Outcome: Progressing   Problem: Safety: Goal: Ability to disclose and discuss suicidal ideas will improve Outcome: Progressing   Problem: Safety: Goal: Ability to remain free from injury will improve Outcome: Progressing

## 2017-11-22 NOTE — Progress Notes (Signed)
Recreation Therapy Notes  Date: 11/22/2017  Time: 9:30 am  Location: Craft Room  Behavioral response: Appropriate   Intervention Topic: Coping Skills  Discussion/Intervention:  Group content on today was focused on coping skills. The group defined what coping skills are and when they can be used. Individuals described how they normally cope with things and the coping skills they normally use. Patients expressed why it is important to cope with things and how not coping with things can affect you. The group participated in the intervention "My coping box" and made coping boxes while adding coping skills they could use in the future to the box. Clinical Observations/Feedback:  Patient came to group and stated she calls her family as a coping skill. Individual was social with peers and staff while participating in the intervention.    LRT/CTRS            11/22/2017 1:35 PM

## 2017-11-22 NOTE — Consult Note (Signed)
Cephas Darby, MD [address]  [place]  [place], [place] [address]  Main: [phone]  Fax: [phone] Pager: [phone]   Consultation  Referring Provider:     Gonzella Lex, MD Primary Care Physician:  [facility] Primary Gastroenterologist:  Dr.Mimi Debellis Marius Ditch          Reason for Consultation:     Abdominal pain, chronic constipation, FOBT positive  Date of Admission:  11/16/2017 Date of Consultation:  11/22/2017         HPI:   Teresa Jefferson is a 52 y.o. female with polysubstance abuse, chronic opioid use who is admitted to behavioral health unit due to depression and suicidal ideation.currently, her depression is under control. She has been suffering from chronic constipation, started on bowel regimen with MiraLAX and Dulcolax. This resulted in bowel movements until 2 days ago. She had hard BM today. She is initially consulted by the hospitalist service for management of her medical conditions. She continues to have lower abdominal pain and constipation. she was also tested positive for FOBT on 11/20/2017. GI is consulted for further evaluation.  NSAIDs: none  Antiplts/Anticoagulants/Anti thrombotics: none  GI Procedures: none  Past Medical History:  Diagnosis Date  . Bipolar 1 disorder ([place])   . Depression   . Hypertension   . Neuropathy   . Schizophrenia ([place])   . Thyroid disease     Past Surgical History:  Procedure Laterality Date  . ABDOMINAL HYSTERECTOMY    . KNEE SURGERY Right   . TONSILLECTOMY       Current Facility-Administered Medications:  .  acetaminophen (TYLENOL) tablet 650 mg, 650 mg, Oral, Q6H PRN, [name], [name], MD, 650 mg at 11/22/17 1342 .  albuterol (PROVENTIL HFA;VENTOLIN HFA) 108 (90 Base) MCG/ACT inhaler 1-2 puff, 1-2 puff, Inhalation, Q6H PRN, [name], MD .  alum & mag hydroxide-simeth (MAALOX/MYLANTA) 200-200-20 MG/5ML suspension 30 mL, 30 mL, Oral, Q4H PRN, [name], [name], MD .   buprenorphine-naloxone (SUBOXONE) 8-2 mg per SL tablet 1 tablet, 1 tablet, Sublingual, Daily, [name], MD, 1 tablet at 11/22/17 0805 .  gabapentin (NEURONTIN) capsule 300 mg, 300 mg, Oral, TID PC, [name], MD, 300 mg at 11/22/17 1342 .  hydrochlorothiazide (HYDRODIURIL) tablet 25 mg, 25 mg, Oral, Daily, [name], MD, 25 mg at 11/22/17 0803 .  hydrOXYzine (ATARAX/VISTARIL) tablet 50 mg, 50 mg, Oral, TID PRN, [name], [name], MD, 50 mg at 11/21/17 2004 .  ibuprofen (ADVIL,MOTRIN) tablet 600 mg, 600 mg, Oral, Q6H PRN, [name], MD, 600 mg at 11/22/17 1016 .  lamoTRIgine (LAMICTAL) tablet 25 mg, 25 mg, Oral, Daily, O'Neal, Sarita, MD, 25 mg at 11/22/17 0803 .  lisinopril (PRINIVIL,ZESTRIL) tablet 5 mg, 5 mg, Oral, Daily, O'Neal, Sarita, MD, 5 mg at 11/22/17 0803 .  loratadine (CLARITIN) tablet 10 mg, 10 mg, Oral, Daily, O'Neal, Sarita, MD, 10 mg at 11/22/17 0803 .  magnesium hydroxide (MILK OF MAGNESIA) suspension 30 mL, 30 mL, Oral, Daily PRN, [name], [name], MD, 30 mL at 11/21/17 2128 .  ofloxacin (OCUFLOX) 0.3 % ophthalmic solution 3 drop, 3 drop, Both EARS, QID, [name], MD, 3 drop at 11/22/17 1210 .  ondansetron (ZOFRAN-ODT) disintegrating tablet 4 mg, 4 mg, Oral, Q8H PRN, [name], MD, 4 mg at 11/21/17 0651 .  QUEtiapine (SEROQUEL) tablet 400 mg, 400 mg, Oral, QHS, [name], [name], MD, 400 mg at 11/21/17 2124   History reviewed. No pertinent family history.  Social History   Tobacco Use  . Smoking status: Never Smoker  . Smokeless tobacco: Never Used  Substance Use Topics  . Alcohol use: No    Comment: occ  . Drug use: Yes    Comment: crack and opiates    Allergies as of 11/16/2017 - Review Complete 11/16/2017  Allergen Reaction Noted  . Amoxicillin Rash 01/30/2016    Review of Systems:    All systems reviewed and negative except where noted in HPI.   Physical Exam:  Vital signs in last 24 hours: Temp:  [98.2 F (36.8 C)] 98.2 F (36.8 C)  (07/10 0600) Pulse Rate:  [98-99] 98 (07/10 0601) Resp:  [18] 18 (07/10 0600) BP: (144-159)/(95-110) 144/95 (07/10 0601) SpO2:  [100 %] 100 % (07/10 0601) Last BM Date: 11/19/17 General:   Pleasant, cooperative in NAD Head:  Normocephalic and atraumatic. Eyes:   No icterus.   Conjunctiva pink. PERRLA. Ears:  Normal auditory acuity. Neck:  Supple; no masses or thyroidomegaly Lungs: Respirations even and unlabored. Lungs clear to auscultation bilaterally.   No wheezes, crackles, or rhonchi.  Heart:  Regular rate and rhythm;  Without murmur, clicks, rubs or gallops Abdomen:  Soft, nondistended, nontender. Normal bowel sounds. No appreciable masses or hepatomegaly.  No rebound or guarding.  Rectal:  Not performed. Msk:  Symmetrical without gross deformities.  Strength normal  Extremities:  Without edema, cyanosis or clubbing. Neurologic:  Alert and oriented x3;  grossly normal neurologically. Skin:  Intact without significant lesions or rashes. Cervical Nodes:  No significant cervical adenopathy. Psych:  Alert and cooperative. Normal affect.  LAB RESULTS: CBC Latest Ref Rng & Units 11/21/2017 11/20/2017 11/16/2017  WBC 3.6 - 11.0 K/uL 5.2 6.0 4.3  Hemoglobin 12.0 - 16.0 g/dL 12.9 13.1 12.9  Hematocrit 35.0 - 47.0 % 39.0 39.4 38.7  Platelets 150 - 440 K/uL 200 197 209    BMET BMP Latest Ref Rng & Units 11/21/2017 11/18/2017 11/16/2017  Glucose 70 - 99 mg/dL 124(H) - 170(H)  BUN 6 - 20 mg/dL 21(H) - 20  Creatinine 0.44 - 1.00 mg/dL 0.64 - 0.75  Sodium 135 - 145 mmol/L 139 - 142  Potassium 3.5 - 5.1 mmol/L 4.5 3.9 2.7(LL)  Chloride 98 - 111 mmol/L 101 - 110  CO2 22 - 32 mmol/L 31 - 24  Calcium 8.9 - 10.3 mg/dL 9.0 - 9.0    LFT Hepatic Function Latest Ref Rng & Units 11/16/2017 04/29/2017  Total Protein 6.5 - 8.1 g/dL 7.3 7.8  Albumin 3.5 - 5.0 g/dL 4.0 4.2  AST 15 - 41 U/L 25 22  ALT 0 - 44 U/L 15 17  Alk Phosphatase 38 - 126 U/L 62 74  Total Bilirubin 0.3 - 1.2 mg/dL 0.6 0.4      STUDIES: No results found.    Impression / Plan:   Teresa Jefferson is a 52 y.o. female with history of depression, polysubstance abuse, chronic constipation and diffuse lower abdominal pain, x-ray KUB was unremarkable except for increased stool burden. Her hemoglobin has been stable and there is no evidence of active GI bleed  Recommend GoLYTELY to relieve constipation Patient is cocaine positive based on urine drug screen on 11/16/2017 Given that she is FOBT positive, recommend colonoscopy Recommend urine toxicology screen tomorrow to confirm if she cleared of cocaine prior to performing colonoscopy. Otherwise this can be done as outpatient  Thank you for involving me in the care of this patient.      LOS: 6 days  Sherri Sear, MD  11/22/2017, 6:10 PM   Note: This dictation was prepared with Dragon dictation along with smaller phrase technology. Any transcriptional errors that result from this process are unintentional.

## 2017-11-22 NOTE — [facility] Group Notes (Signed)
LCSW Group Therapy Note  11/22/2017 1:00 pm  Type of Therapy/Topic:  Group Therapy:  Emotion Regulation  Participation Level:  Minimal   Description of Group:    The purpose of this group is to assist patients in learning to regulate negative emotions and experience positive emotions. Patients will be guided to discuss ways in which they have been vulnerable to their negative emotions. These vulnerabilities will be juxtaposed with experiences of positive emotions or situations, and patients will be challenged to use positive emotions to combat negative ones. Special emphasis will be placed on coping with negative emotions in conflict situations, and patients will process healthy conflict resolution skills.  Therapeutic Goals: 1. Patient will identify two positive emotions or experiences to reflect on in order to balance out negative emotions 2. Patient will label two or more emotions that they find the most difficult to experience 3. Patient will demonstrate positive conflict resolution skills through discussion and/or role plays  Summary of Patient Progress:  Teresa Jefferson did not actively participate in today's group discussion on emotion regulation.  Teresa Jefferson did share that the one emotion that she has difficulty experiencing is "feeling anxious all the time".  Teresa Jefferson was apparently sleepy as CSW noticed that she was sleeping throughout most of the group.     Therapeutic Modalities:   Cognitive Behavioral Therapy Feelings Identification Dialectical Behavioral Therapy

## 2017-11-22 NOTE — Progress Notes (Signed)
Patient ID: Teresa Jefferson, female   DOB: 09/12/1965, 52 y.o.   MRN: [id number] CSW met with pt for discharge planning.  CSW confirmed with pt that she was interested in participating in an inpatient residential rehab program.  Pt informed CSW that she was interested in participating in an inpatient rehab program, but only if she would be able to continue receiving Suboxone.  CSW informed pt of four inpatient rehab programs that she could submit applications/documentation to.  The programs include [facility], [place], [place]), [facility] [place], [place] and [facility] [place], [place]  CSW informed pt that [facility] has a Patent attorney. Pt informed CSW that she was not interested in [facility] as she would rather continue receiving Suboxone instead of the Methadone.  Pt  Signed consents for the other three programs.  CSW informed pt that she would have to first contact each program to make sure that they will allow or offer Medication Assisted Treatment (Suboxone) before submitting any information for placement consideration.  Pt asked CSW about boarding house resources.  CSW gave pt a list of local boarding homes and informed her that she would need to contact them to inquire about rooms.  CSW contacted [facility] to inquire if they accepted pt's who are receiving Medication Assisted Treatment.  CSW was informed that they do not accept pts who are receiving Medication Assisted Treatment. CSW will continue seeking inpatient rehab placements on pt's behalf.
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
## 2017-11-22 NOTE — Tx Team (Signed)
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 acetaminophen (TYLENOL) tablet 650 mg  650 mg Oral Q6H PRN [name], [name], MD   650 mg at 11/22/17 1342  . albuterol (PROVENTIL HFA;VENTOLIN HFA) 108 (90 Base) MCG/ACT inhaler 1-2 puff  1-2 puff Inhalation Q6H PRN [name], MD      . alum & mag hydroxide-simeth (MAALOX/MYLANTA) 200-200-20 MG/5ML suspension 30 mL  30 mL Oral Q4H PRN [name], [name], MD      . buprenorphine-naloxone (SUBOXONE) 8-2 mg per SL tablet 1 tablet  1 tablet Sublingual Daily [name], MD   1 tablet at 11/22/17 0805  . gabapentin (NEURONTIN) capsule 300 mg  300 mg Oral TID PC [name], MD   300 mg at 11/22/17 1342  . hydrochlorothiazide (HYDRODIURIL) tablet 25 mg  25 mg Oral Daily [name], MD   25 mg at 11/22/17 0803  . hydrOXYzine (ATARAX/VISTARIL) tablet 50 mg  50 mg Oral TID PRN [name], [name], MD   50 mg at 11/21/17 2004  . ibuprofen (ADVIL,MOTRIN) tablet 600 mg  600 mg Oral Q6H PRN [name], MD   600 mg at 11/22/17 1016  . lamoTRIgine (LAMICTAL) tablet 25 mg  25 mg Oral Daily [name], MD   25 mg at 11/22/17 0803  . lisinopril (PRINIVIL,ZESTRIL) tablet 5 mg  5 mg Oral Daily [name], MD   5 mg at 11/22/17 0803  . loratadine (CLARITIN) tablet 10 mg  10 mg Oral Daily [name], MD   10 mg at 11/22/17 0803  . magnesium  hydroxide (MILK OF MAGNESIA) suspension 30 mL  30 mL Oral Daily PRN [name], [name], MD   30 mL at 11/21/17 2128  . ofloxacin (OCUFLOX) 0.3 % ophthalmic solution 3 drop  3 drop Both EARS QID [name], MD   3 drop at 11/22/17 1210  . ondansetron (ZOFRAN-ODT) disintegrating tablet 4 mg  4 mg Oral Q8H PRN [name], MD   4 mg at 11/21/17 0651  . polyethylene glycol-electrolytes (NuLYTELY/GoLYTELY) solution 4,000 mL  4,000 mL Oral Once [name], MD      . QUEtiapine (SEROQUEL) tablet 400 mg  400 mg Oral QHS [name], [name], MD   400 mg at 11/21/17 2124   PTA Medications: Medications Prior to Admission  Medication Sig Dispense Refill Last Dose  . levothyroxine (SYNTHROID, LEVOTHROID) 125 MCG tablet Take 125 mcg by mouth daily before breakfast.     . lisinopril-hydrochlorothiazide (PRINZIDE,ZESTORETIC) 20-25 MG tablet Take 1 tablet by mouth daily.   04/28/2017 at Unknown time  . QUEtiapine (SEROQUEL) 400 MG tablet Take 1 tablet (400 mg total) by mouth at bedtime. 7 tablet 0 04/28/2017 at  2200  . traMADol (ULTRAM) 50 MG tablet Take 1 tablet (50 mg total) by mouth every 6 (six) hours as needed. 20 tablet 0     Patient Stressors: Financial difficulties Health problems Legal issue Medication change or noncompliance Occupational concerns Substance abuse  Patient Strengths: Ability for  insight Communication skills Motivation for treatment/growth  Treatment Modalities: Medication Management, Group therapy, Case management,  1 to 1 session with clinician, Psychoeducation, Recreational therapy.   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[place]) Long Term Goal(s): Improvement in symptoms so as ready for discharge Improvement in symptoms so as ready for discharge   Short Term Goals: Ability to identify changes in lifestyle to reduce recurrence of condition will improve Ability to verbalize feelings will improve Ability to  identify triggers associated with substance abuse/mental health issues will improve Ability to verbalize feelings will improve Ability to disclose and discuss suicidal ideas Ability to identify and develop effective coping behaviors will improve Compliance with prescribed medications will improve Ability to identify triggers associated with substance abuse/mental health issues will improve  Medication Management: Evaluate patient's response, side effects, and tolerance of medication regimen.  Therapeutic Interventions: 1 to 1 sessions, Unit Group sessions and Medication administration.  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[name]) Active Problems:   Severe recurrent major depression without psychotic features ([name])   Essential hypertension   Asthma   Cocaine use disorder, severe, dependence ([name])   Opioid use disorder, severe, dependence ([name])  Long Term Goal(s): Improvement in symptoms so as ready for discharge Improvement in symptoms so as ready for discharge   Short Term Goals: Ability to identify changes in lifestyle to reduce recurrence of condition will improve Ability to verbalize feelings will improve Ability to identify triggers associated with substance abuse/mental health issues will improve Ability to verbalize feelings will improve Ability to disclose and discuss suicidal ideas Ability to identify and develop effective coping behaviors will improve Compliance with prescribed medications will improve Ability to identify triggers associated with substance abuse/mental health issues will improve     Medication Management: Evaluate patient's response, side effects, and tolerance of medication regimen.  Therapeutic Interventions: 1 to 1 sessions, Unit Group sessions and Medication administration.  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 RN will report any adverse and/or side effects to prescribing provider.  Therapeutic Interventions: 1 on 1 counseling sessions, Psychoeducation, Medication administration, Evaluate responses to treatment, Monitor vital signs and CBGs as ordered, Perform/monitor CIWA, COWS, AIMS and Fall Risk screenings as ordered, Perform wound care treatments as ordered.  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  Short Term Goals: Engage patient in aftercare planning with referrals and resources and Increase skills for wellness and recovery  Therapeutic Interventions: Assess for all discharge needs, 1 to 1 time with Social worker, Explore available resources and support systems, Assess for adequacy in community support network, Educate family and significant other(s) on suicide prevention, Complete Psychosocial Assessment, Interpersonal group therapy.  Evaluation of Outcomes: Progressing   Progress in Treatment: Attending groups: Yes. Participating in groups: No. Taking medication  as prescribed: Yes. Toleration medication: Yes. Family/Significant other contact made: No, will contact:  Pt refused to have family support  contacted. Patient understands diagnosis: Yes. Discussing patient identified problems/goals with staff: Yes. Medical problems stabilized or resolved: Yes. Denies suicidal/homicidal ideation: Yes. Issues/concerns per patient self-inventory: No. Other: n/a  New problem(s) identified: No, Describe:  No new problems identified  New Short Term/Long Term Goal(s):  Patient Goals: "To get clean so I can get back in with my family and grandkids;  Get my mind where I can make better decisions"   Discharge Plan or Barriers: Tentative discharge plan is for pt to go to an inpatient rehab facility.  Pt would like to be referred to an inpatient rehab provider that will continue with her Suboxone treatment.  Reason for Continuation of Hospitalization: Anxiety Depression Medication stabilization   Estimated Length of Stay: 5-7 days  Recreational Therapy: Patient Stressors: Homeless, Relationship  Patient Goal: Patient will identify 3 positive coping skills strategies to use post d/c within 5 recreation therapy group sessions  Attendees: Patient:  11/22/2017 3:22 PM  Physician: Andrena Mews, MD 11/22/2017 3:22 PM  Nursing: Polly Cobia, RN 11/22/2017 3:22 PM  RN Care Manager: 11/22/2017 3:22 PM  Social Worker: Derrek Gu, LCSW 11/22/2017 3:22 PM  Recreational Therapist: Roanna Epley, LRT 11/22/2017 3:22 PM  Other: Alden Hipp, LCSW 11/22/2017 3:22 PM  Other: Marney Doctor, Chaplain 11/22/2017 3:22 PM  Other: Darin Engels, [place] 11/22/2017 3:22 PM    Scribe for Treatment Team: Devona Konig, LCSW 11/22/2017 3:22 PM

## 2017-11-22 NOTE — Progress Notes (Signed)
D: Patient stated slept good last night .Stated appetite is good and energy level  Is normal. Stated concentration is good . Stated on Depression scale 6 , hopeless 0 and anxiety5 .( low 0-10 high) Denies suicidal  homicidal ideations  .  No auditory hallucinations  No pain concerns . Appropriate ADL'S. Interacting with peers and staff. Patient aware and understanding of information received from staff . Improving with emotional and mental status . Voice of no safety concerns . Denies suicidal ideations. Continue to have somatic  Compliants  Attending unit programing . Interacting  with  peers and staff A: Encourage patient participation with unit programming . Instruction  Given on  Medication , verbalize understanding. R: Voice no other concerns. Staff continue to monitor

## 2017-11-22 NOTE — Plan of Care (Addendum)
Patient found in bathroom upon my arrival. Patient is visible and social this evening. Immediately, patient complains of generalized cramping and pain throughout her body, especially in feet and back. Patient believes that being given only one Suboxone per day is causing multiple WD S/Sx including pain, anxiety, and restlessness. Patient reports that she is interested in going to another facility tomorrow. Believes she is not getting better but worse since being here. Given Motrin and Vistaril with 2100 dose of Neurontin with some relief reported. Continues to complain of back pain 9/10 after 90 minutes, given Tylenol. Denies SI/HI/AVH. Complains of anxiety and depression. Patient is frequently at nursing station due to restlessness, pain, and anxiety. Complains of constipation, given MOM. Will monitor for efficacy. Reports eating adequately. Compliant with HS medications and staff direction. Q 15 minute checks maintained. Will continue to monitor throughout the shift. Patient slept 7.75 hours. No apparent distress. Will endorse care to oncoming shift.  Problem: Education: Goal: Verbalization of understanding the information provided will improve Outcome: Progressing   Problem: Safety: Goal: Ability to disclose and discuss suicidal ideas will improve Outcome: Progressing   Problem: Safety: Goal: Ability to remain free from injury will improve Outcome: Progressing   Problem: Education: Goal: Emotional status will improve Outcome: Not Progressing Goal: Mental status will improve Outcome: Not Progressing

## 2017-11-23 ENCOUNTER — Inpatient Hospital Stay: Payer: Medicaid Other | Admitting: Anesthesiology

## 2017-11-23 ENCOUNTER — Encounter
Admission: AD | Disposition: A | Discharge status: Home / Self Care | Payer: Self-pay | Source: Intra-hospital | Attending: Psychiatry

## 2017-11-23 DIAGNOSIS — F1120 Opioid dependence, uncomplicated: Secondary | ICD-10-CM

## 2017-11-23 DIAGNOSIS — F1994 Other psychoactive substance use, unspecified with psychoactive substance-induced mood disorder: Secondary | ICD-10-CM

## 2017-11-23 DIAGNOSIS — F1420 Cocaine dependence, uncomplicated: Secondary | ICD-10-CM

## 2017-11-23 DIAGNOSIS — K625 Hemorrhage of anus and rectum: Secondary | ICD-10-CM

## 2017-11-23 DIAGNOSIS — F3289 Other specified depressive episodes: Secondary | ICD-10-CM

## 2017-11-23 HISTORY — PX: COLONOSCOPY: SHX5424

## 2017-11-23 LAB — URINE DRUG SCREEN, QUALITATIVE (ARMC ONLY)
AMPHETAMINES, UR SCREEN: NOT DETECTED
BENZODIAZEPINE, UR SCRN: NOT DETECTED
CANNABINOID 50 NG, UR ~~LOC~~: NOT DETECTED
Cocaine Metabolite,Ur ~~LOC~~: NOT DETECTED
MDMA (Ecstasy)Ur Screen: NOT DETECTED
Methadone Scn, Ur: NOT DETECTED
Opiate, Ur Screen: NOT DETECTED
PHENCYCLIDINE (PCP) UR S: NOT DETECTED
Tricyclic, Ur Screen: POSITIVE — AB

## 2017-11-23 SURGERY — COLONOSCOPY
Anesthesia: General

## 2017-11-23 MED ORDER — AMITRIPTYLINE HCL 25 MG PO TABS
25.0000 mg | ORAL_TABLET | Freq: Every day | ORAL | Status: DC
  Administered 2017-11-23 – 2017-12-05 (×13): 25 mg via ORAL
  Filled 2017-11-23 (×13): qty 1

## 2017-11-23 MED ORDER — PROPOFOL 10 MG/ML IV BOLUS
INTRAVENOUS | Status: DC | PRN
  Administered 2017-11-23 (×2): 30 mg via INTRAVENOUS

## 2017-11-23 MED ORDER — PROPOFOL 500 MG/50ML IV EMUL
INTRAVENOUS | Status: DC | PRN
  Administered 2017-11-23: 75 ug/kg/min via INTRAVENOUS

## 2017-11-23 MED ORDER — SODIUM CHLORIDE 0.9 % IV SOLN: INTRAVENOUS | Status: DC

## 2017-11-23 MED ORDER — ALPRAZOLAM 0.5 MG PO TABS
0.5000 mg | ORAL_TABLET | Freq: Once | ORAL | Status: AC
  Administered 2017-11-23: 0.5 mg via ORAL
  Filled 2017-11-23: qty 1

## 2017-11-23 MED ORDER — BACITRACIN-NEOMYCIN-POLYMYXIN 400-5-5000 EX OINT
TOPICAL_OINTMENT | Freq: Two times a day (BID) | CUTANEOUS | Status: DC
  Administered 2017-11-24: 08:00:00 via TOPICAL
  Administered 2017-11-24 – 2017-11-29 (×5): 1 via TOPICAL
  Filled 2017-11-23 (×10): qty 1

## 2017-11-23 MED ORDER — PEG 3350-KCL-NA BICARB-NACL 420 G PO SOLR
4000.0000 mL | Freq: Once | ORAL | Status: AC
  Administered 2017-11-23: 4000 mL via ORAL
  Filled 2017-11-23: qty 4000

## 2017-11-23 MED ORDER — SODIUM CHLORIDE 0.9 % IV SOLN
INTRAVENOUS | Status: DC
  Administered 2017-11-23: 1000 mL via INTRAVENOUS

## 2017-11-23 MED ORDER — ENSURE PRE-SURGERY PO LIQD
296.0000 mL | Freq: Once | ORAL | Status: DC
  Filled 2017-11-23: qty 296

## 2017-11-23 NOTE — Anesthesia Post-op Follow-up Note (Signed)
Anesthesia QCDR form completed.        

## 2017-11-23 NOTE — Plan of Care (Signed)
Patient is alert and oriented. Denies SI, HI and AVH. Patient is restless today awaiting colonoscopy procedure today. Patient has finished prep and has been NPO. Patient is pleasant and cooperative, attends groups and interacts appropriately with staff and peers. Patient rates depression 5/10 and anxiety 4/10. Patient complained of having back and leg pain. Patient has motrin for pain if needed. Patient remains free from injury and is able to verbalize understanding of information. Problem: Education: Goal: Knowledge of [facility] Education information/materials will improve Outcome: Progressing Goal: Emotional status will improve Outcome: Progressing Goal: Mental status will improve Outcome: Progressing Goal: Verbalization of understanding the information provided will improve Outcome: Progressing   Problem: Safety: Goal: Periods of time without injury will increase Outcome: Progressing   Problem: Safety: Goal: Ability to remain free from injury will improve Outcome: Progressing
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
## 2017-11-23 NOTE — Progress Notes (Signed)
Date: 11/23/2017  Time: 3:00pm  Location: Craft room  Behavioral response: N/A  Group Type: Game  Participation level: N/A  Communication: Patient did not attend group.  Comments: N/A    LRT/CTRS

## 2017-11-23 NOTE — BHH Group Notes (Signed)
LCSW Group Therapy Note 11/23/2017 9:00 AM  Type of Therapy and Topic:  Group Therapy:  Setting Goals  Participation Level:  Active  Description of Group: In this process group, patients discussed using strengths to work toward goals and address challenges.  Patients identified two positive things about themselves and one goal they were working on.  Patients were given the opportunity to share openly and support each other's plan for self-empowerment.  The group discussed the value of gratitude and were encouraged to have a daily reflection of positive characteristics or circumstances.  Patients were encouraged to identify a plan to utilize their strengths to work on current challenges and goals.  Therapeutic Goals 1. Patient will verbalize personal strengths/positive qualities and relate how these can assist with achieving desired personal goals 2. Patients will verbalize affirmation of peers plans for personal change and goal setting 3. Patients will explore the value of gratitude and positive focus as related to successful achievement of goals 4. Patients will verbalize a plan for regular reinforcement of personal positive qualities and circumstances.  Summary of Patient Progress:  Teresa Jefferson was unable to remain in group the entire time due to having a procedure done, but she was actively participated in the group discussion on setting goals. Teresa Jefferson shared that she has a goal to work on improving her relationship with her children and grandchildren as well as work on herself when she is discharged from the hospital.  Teresa Jefferson shared that by working on herself she feels that this will be the first step in improving her relationships with her children and grandchildren.     Therapeutic Modalities Cognitive Behavioral Therapy Motivational Interviewing    Devona Konig, [place] 11/23/2017 2:03 PM

## 2017-11-23 NOTE — Progress Notes (Signed)
Colonoscopy procedure is aborted due to poor prep, semisolid stool in the rectum. Patient is agreeable to undergo colonoscopy tomorrow. Full liquid diet for lunch followed by clear liquid diet for dinner. Bowel prep tonight, nothing by mouth past midnight  Cephas Darby, MD [address]  [place]  [place], [place] [address]  Main: [phone]  Fax: [phone] Pager: [phone]

## 2017-11-23 NOTE — Progress Notes (Signed)
Patient ID: Teresa Jefferson, female   DOB: 03/27/1966, 52 y.o.   MRN: [id number] CSW completed and submitted application and other clinical documentation to [facility] for review and consideration of admission.  CSW will follow-up with facility regarding referral and will continue to seek appropriate inpatient tx services on pt's behalf.

## 2017-11-23 NOTE — Plan of Care (Addendum)
Patient found in common area upon my arrival. Patient is visible and social this evening. Attends group. Asks permission to help a peer with laundry. Pleasant and cooperative throughout the evening. Patient is compliant with colon prep for anticipated colonoscopy tomorrow. Patient only took in clear liquids until midnight when her status became NPO. Reports that stool is "brown water" prior to drinking the last 2 cups of bowel prep. Reports hunger and bloating. Reports slight nausea due to increased liquid paired with hunger. Given Zofran SL with positive results. Patient appears preoccupied with colonoscopy so she is complaining less of depression and anxiety. Affect is bright and cheerful. Denies SI/HI/AVH. Complains of back pain, given Ibuprofen with positive results. Compliant with HS medications and staff direction. Q 15 minute checks maintained. Will continue to monitor throughout the shift. Patient maintains NPO status. Compliant with UDS. Slept 6.25 hours. No apparent distress. Complains of pain, given Tylenol with positive results. Reiterated instructions regarding NPO status and colonoscopy prep. Verbalizes understanding. Will endorse care to oncoming shift.  Problem: Education: Goal: Knowledge of [facility] Education information/materials will improve Outcome: Progressing Goal: Emotional status will improve Outcome: Progressing Goal: Mental status will improve Outcome: Progressing

## 2017-11-23 NOTE — Progress Notes (Signed)
Recreation Therapy Notes  Date: 11/23/2017  Time: 9:30 am  Location: Craft Room  Behavioral response: Appropriate   Intervention Topic: Anger Management  Discussion/Intervention:  Group content on today was focused on anger management. The group defined anger and reasons they become angry. Individuals expressed negative way they have dealt with anger in the past. Patients stated some positive ways they could deal with anger in the future. The group described how anger can affect your health and daily plans. Individuals participated in the intervention "Score your anger" where they had a chance to answer questions about themselves and get a score of their anger.  Clinical Observations/Feedback:  Patient came to group and stated that she normally breathes hard and has an asthma attack when she is angry. Individual identified praying as a way to manage her anger. She was social with peers and staff while participating in the intervention.   LRT/CTRS            11/23/2017 1:10 PM

## 2017-11-23 NOTE — Op Note (Signed)
[facility] Gastroenterology Patient Name: Teresa Jefferson Procedure Date: 11/23/2017 1:01 PM MRN: [id number] Account #: [id number] Date of Birth: 06/08/1965 Admit Type: Inpatient Age: 52 Room: [facility] ENDO ROOM 4 Gender: Female Note Status: Finalized Procedure:            Colonoscopy Indications:          Rectal bleeding, Positive fecal immunochemical test Providers:            Lin Landsman MD, MD Referring MD:         Gonzella Lex (Referring MD), No Local Md, MD                        (Referring MD) Medicines:            Monitored Anesthesia Care Complications:        No immediate complications. Estimated blood loss: None. Procedure:            Pre-Anesthesia Assessment:                       - Prior to the procedure, a History and Physical was                        performed, and patient medications and allergies were                        reviewed. The patient is competent. The risks and                        benefits of the procedure and the sedation options and                        risks were discussed with the patient. All questions                        were answered and informed consent was obtained.                        Patient identification and proposed procedure were                        verified by the physician, the nurse, the                        anesthesiologist, the anesthetist and the technician in                        the pre-procedure area in the procedure room in the                        endoscopy suite. Mental Status Examination: alert and                        oriented. Airway Examination: normal oropharyngeal                        airway and neck mobility. Respiratory Examination:                        clear to auscultation. CV Examination: normal.  Prophylactic Antibiotics: The patient does not require                        prophylactic antibiotics. Prior Anticoagulants: The        patient has taken no previous anticoagulant or                        antiplatelet agents. ASA Grade Assessment: III - A                        patient with severe systemic disease. After reviewing                        the risks and benefits, the patient was deemed in                        satisfactory condition to undergo the procedure. The                        anesthesia plan was to use monitored anesthesia care                        (MAC). Immediately prior to administration of                        medications, the patient was re-assessed for adequacy                        to receive sedatives. The heart rate, respiratory rate,                        oxygen saturations, blood pressure, adequacy of                        pulmonary ventilation, and response to care were                        monitored throughout the procedure. The physical status                        of the patient was re-assessed after the procedure.                       After obtaining informed consent, the colonoscope was                        passed under direct vision. Throughout the procedure,                        the patient's blood pressure, pulse, and oxygen                        saturations were monitored continuously. The                        Colonoscope was introduced through the anus with the                        intention of advancing to the cecum. The scope was  advanced to the rectum before the procedure was aborted                        due to poor prep. Medications were given. The                        colonoscopy was performed with difficulty due to poor                        bowel prep with stool present. Successful completion of                        the procedure was aided by procedure aborted. The                        quality of the bowel preparation was poor. Findings:      The perianal and digital rectal examinations were normal. Pertinent        negatives include normal sphincter tone and no palpable rectal lesions.      A large amount of semi-solid stool was found in the rectum, precluding       visualization. Procedure aborted Impression:           - Preparation of the colon was poor.                       - Stool in the rectum.                       - No specimens collected. Recommendation:       - Return patient to hospital ward for ongoing care.                       - Clear liquid diet.                       - Continue present medications.                       - Repeat colonoscopy tomorrow if pt agreebale,                        otherwise as outpt because the bowel preparation was                        poor. Procedure Code(s):    --- Professional ---                       [phone], 53, Colonoscopy, flexible; diagnostic, including                        collection of specimen(s) by brushing or washing, when                        performed (separate procedure) Diagnosis Code(s):    --- Professional ---                       K62.5, Hemorrhage of anus and rectum                       R19.5, Other fecal abnormalities CPT  copyright 2017 American Medical Association. All rights reserved. The codes documented in this report are preliminary and upon coder review may  be revised to meet current compliance requirements. Dr. Ulyess Mort Lin Landsman MD, MD 11/23/2017 1:44:01 PM This report has been signed electronically. Number of Addenda: 0 Note Initiated On: 11/23/2017 1:01 PM Total Procedure Duration: 0 hours 0 minutes 20 seconds       [facility]

## 2017-11-23 NOTE — BHH Group Notes (Signed)
  11/23/2017  Time: 1PM  Type of Therapy/Topic:  Group Therapy:  Balance in Life  Participation Level:  Did Not Attend  Description of Group:   This group will address the concept of balance and how it feels and looks when one is unbalanced. Patients will be encouraged to process areas in their lives that are out of balance and identify reasons for remaining unbalanced. Facilitators will guide patients in utilizing problem-solving interventions to address and correct the stressor making their life unbalanced. Understanding and applying boundaries will be explored and addressed for obtaining and maintaining a balanced life. Patients will be encouraged to explore ways to assertively make their unbalanced needs known to significant others in their lives, using other group members and facilitator for support and feedback.  Therapeutic Goals: 1. Patient will identify two or more emotions or situations they have that consume much of in their lives. 2. Patient will identify signs/triggers that life has become out of balance:  3. Patient will identify two ways to set boundaries in order to achieve balance in their lives:  4. Patient will demonstrate ability to communicate their needs through discussion and/or role plays  Summary of Patient Progress: Pt was invited to attend group but chose not to attend. CSW will continue to encourage pt to attend group throughout their admission.    Therapeutic Modalities:   Cognitive Behavioral Therapy Solution-Focused Therapy Assertiveness Training  Alden Hipp, MSW, LCSW Clinical Social Worker 11/23/2017 2:41 PM

## 2017-11-23 NOTE — OR Nursing (Signed)
Pt ready for transfer to [facility]heavior medicine via wheelchair.

## 2017-11-23 NOTE — Anesthesia Preprocedure Evaluation (Signed)
Anesthesia Evaluation  Patient identified by MRN, date of birth, ID band Patient awake    Reviewed: Allergy & Precautions, H&P , NPO status , reviewed documented beta blocker date and time   Airway Mallampati: II  TM Distance: >3 FB Neck ROM: full    Dental  (+) Chipped, Poor Dentition   Pulmonary asthma ,    Pulmonary exam normal        Cardiovascular hypertension, Normal cardiovascular exam     Neuro/Psych PSYCHIATRIC DISORDERS Depression Bipolar Disorder Schizophrenia    GI/Hepatic   Endo/Other    Renal/GU      Musculoskeletal   Abdominal   Peds  Hematology   Anesthesia Other Findings UDS negative for cocaine  Past Medical History: No date: Bipolar 1 disorder ([place]) No date: Depression No date: Hypertension No date: Neuropathy No date: Schizophrenia ([place]) No date: Thyroid disease  Past Surgical History: No date: ABDOMINAL HYSTERECTOMY No date: KNEE SURGERY; Right No date: TONSILLECTOMY  BMI    Body Mass Index:  31.46 kg/m      Reproductive/Obstetrics                             Anesthesia Physical Anesthesia Plan  ASA: III  Anesthesia Plan: General   Post-op Pain Management:    Induction:   PONV Risk Score and Plan: 3 and Treatment may vary due to age or medical condition and TIVA  Airway Management Planned:   Additional Equipment:   Intra-op Plan:   Post-operative Plan:   Informed Consent: I have reviewed the patients History and Physical, chart, labs and discussed the procedure including the risks, benefits and alternatives for the proposed anesthesia with the patient or authorized representative who has indicated his/her understanding and acceptance.   Dental Advisory Given  Plan Discussed with: CRNA  Anesthesia Plan Comments:         Anesthesia Quick Evaluation

## 2017-11-23 NOTE — Transfer of Care (Signed)
Immediate Anesthesia Transfer of Care Note  Patient: Teresa Jefferson  Procedure(s) Performed: COLONOSCOPY (N/A )  Patient Location: PACU  Anesthesia Type:General  Level of Consciousness: sedated  Airway & Oxygen Therapy: Patient Spontanous Breathing and Patient connected to nasal cannula oxygen  Post-op Assessment: Report given to RN and Post -op Vital signs reviewed and stable  Post vital signs: Reviewed and stable  Last Vitals:  Vitals Value Taken Time  BP 110/71 11/23/2017  1:45 PM  Temp 36.1 C 11/23/2017  1:45 PM  Pulse 69 11/23/2017  1:47 PM  Resp 13 11/23/2017  1:47 PM  SpO2 100 % 11/23/2017  1:47 PM  Vitals shown include unvalidated device data.  Last Pain:  Vitals:   11/23/17 1345  TempSrc: Tympanic  PainSc: 0-No pain      Patients Stated Pain Goal: 0 (15/86/82 [id number])  Complications: No apparent anesthesia complications

## 2017-11-24 ENCOUNTER — Inpatient Hospital Stay: Payer: Medicaid Other | Admitting: Anesthesiology

## 2017-11-24 ENCOUNTER — Encounter
Admission: AD | Disposition: A | Discharge status: Home / Self Care | Payer: Self-pay | Source: Intra-hospital | Attending: Psychiatry

## 2017-11-24 ENCOUNTER — Encounter: Payer: Self-pay | Admitting: Anesthesiology

## 2017-11-24 DIAGNOSIS — F333 Major depressive disorder, recurrent, severe with psychotic symptoms: Secondary | ICD-10-CM

## 2017-11-24 DIAGNOSIS — R195 Other fecal abnormalities: Secondary | ICD-10-CM

## 2017-11-24 DIAGNOSIS — K648 Other hemorrhoids: Secondary | ICD-10-CM

## 2017-11-24 DIAGNOSIS — F1420 Cocaine dependence, uncomplicated: Secondary | ICD-10-CM

## 2017-11-24 DIAGNOSIS — F1120 Opioid dependence, uncomplicated: Secondary | ICD-10-CM

## 2017-11-24 DIAGNOSIS — D120 Benign neoplasm of cecum: Secondary | ICD-10-CM

## 2017-11-24 HISTORY — PX: COLONOSCOPY: SHX5424

## 2017-11-24 LAB — T3: T3, Total: 122 ng/dL (ref 71–180)

## 2017-11-24 SURGERY — COLONOSCOPY
Anesthesia: General

## 2017-11-24 MED ORDER — MIDAZOLAM HCL 5 MG/5ML IJ SOLN
INTRAMUSCULAR | Status: DC | PRN
  Administered 2017-11-24: 2 mg via INTRAVENOUS

## 2017-11-24 MED ORDER — MIDAZOLAM HCL 2 MG/2ML IJ SOLN
INTRAMUSCULAR | Status: AC
  Filled 2017-11-24: qty 2

## 2017-11-24 MED ORDER — LIDOCAINE HCL (PF) 2 % IJ SOLN
INTRAMUSCULAR | Status: AC
  Filled 2017-11-24: qty 10

## 2017-11-24 MED ORDER — LIDOCAINE HCL (PF) 2 % IJ SOLN
INTRAMUSCULAR | Status: DC | PRN
  Administered 2017-11-24: 80 mg

## 2017-11-24 MED ORDER — PROPOFOL 500 MG/50ML IV EMUL
INTRAVENOUS | Status: AC
Start: 2017-11-24 — End: ?
  Filled 2017-11-24: qty 50

## 2017-11-24 MED ORDER — PROPOFOL 500 MG/50ML IV EMUL
INTRAVENOUS | Status: DC | PRN
  Administered 2017-11-24: 50 ug/kg/min via INTRAVENOUS

## 2017-11-24 MED ORDER — PROPOFOL 10 MG/ML IV BOLUS
INTRAVENOUS | Status: DC | PRN
  Administered 2017-11-24: 20 mg via INTRAVENOUS
  Administered 2017-11-24: 10 mg via INTRAVENOUS
  Administered 2017-11-24: 20 mg via INTRAVENOUS

## 2017-11-24 MED ORDER — FENTANYL CITRATE (PF) 100 MCG/2ML IJ SOLN
INTRAMUSCULAR | Status: AC
  Filled 2017-11-24: qty 2

## 2017-11-24 MED ORDER — FENTANYL CITRATE (PF) 100 MCG/2ML IJ SOLN
INTRAMUSCULAR | Status: DC | PRN
  Administered 2017-11-24 (×2): 50 ug via INTRAVENOUS

## 2017-11-24 NOTE — Op Note (Signed)
[facility] Gastroenterology Patient Name: Teresa Jefferson Procedure Date: 11/24/2017 1:37 PM MRN: [id number] Account #: [id number] Date of Birth: 05/01/1966 Admit Type: Outpatient Age: 52 Room: [facility] ENDO ROOM 2 Gender: Female Note Status: Finalized Procedure:            Colonoscopy Indications:          Positive fecal immunochemical test Providers:            Lin Landsman MD, MD Referring MD:         Gonzella Lex (Referring MD), [place], MD (Referring MD) Medicines:            Monitored Anesthesia Care Complications:        No immediate complications. Estimated blood loss:                        Minimal. Procedure:            Pre-Anesthesia Assessment:                       - Prior to the procedure, a History and Physical was                        performed, and patient medications and allergies were                        reviewed. The patient is competent. The risks and                        benefits of the procedure and the sedation options and                        risks were discussed with the patient. All questions                        were answered and informed consent was obtained.                        Patient identification and proposed procedure were                        verified by the physician, the nurse, the                        anesthesiologist, the anesthetist and the technician in                        the pre-procedure area in the procedure room in the                        endoscopy suite. Mental Status Examination: alert and                        oriented. Airway Examination: normal oropharyngeal                        airway and neck mobility. Respiratory Examination:  clear to auscultation. CV Examination: normal.                        Prophylactic Antibiotics: The patient does not require                        prophylactic antibiotics. Prior  Anticoagulants: The                        patient has taken no previous anticoagulant or                        antiplatelet agents. ASA Grade Assessment: III - A                        patient with severe systemic disease. After reviewing                        the risks and benefits, the patient was deemed in                        satisfactory condition to undergo the procedure. The                        anesthesia plan was to use monitored anesthesia care                        (MAC). Immediately prior to administration of                        medications, the patient was re-assessed for adequacy                        to receive sedatives. The heart rate, respiratory rate,                        oxygen saturations, blood pressure, adequacy of                        pulmonary ventilation, and response to care were                        monitored throughout the procedure. The physical status                        of the patient was re-assessed after the procedure.                       After obtaining informed consent, the colonoscope was                        passed under direct vision. Throughout the procedure,                        the patient's blood pressure, pulse, and oxygen                        saturations were monitored continuously. The                        Colonoscope  was introduced through the anus and                        advanced to the the cecum, identified by appendiceal                        orifice and ileocecal valve. The colonoscopy was                        performed with moderate difficulty due to inadequate                        bowel prep and a tortuous colon. Successful completion                        of the procedure was aided by applying abdominal                        pressure and lavage. The patient tolerated the                        procedure well. The quality of the bowel preparation                        was evaluated using the BBPS  [facility] Bowel Preparation                        Scale) with scores of: Right Colon = 2 (minor amount of                        residual staining, small fragments of stool and/or                        opaque liquid, but mucosa seen well), Transverse Colon                        = 2 (minor amount of residual staining, small fragments                        of stool and/or opaque liquid, but mucosa seen well)                        and Left Colon = 2 (minor amount of residual staining,                        small fragments of stool and/or opaque liquid, but                        mucosa seen well). The total BBPS score equals 6. The                        quality of the bowel preparation was fair. Findings:      The perianal and digital rectal examinations were normal. Pertinent       negatives include normal sphincter tone and no palpable rectal lesions.      Two sessile polyps were found in the cecum. The polyps were 4 to 5 mm in       size. These polyps were  removed with a cold snare. Resection and       retrieval were complete.      A 5 mm polyp was found in the cecum. The polyp was sessile. The polyp       was removed with a cold biopsy forceps. Resection and retrieval were       complete.      Copious quantities of liquid stool was found in the entire colon,       interfering with visualization. Lavage of the area was performed using       200 - 500 mL of sterile water, resulting in clearance with fair       visualization.      Internal hemorrhoids were found during retroflexion. The hemorrhoids       were large. Impression:           - Preparation of the colon was fair.                       - Two 4 to 5 mm polyps in the cecum, removed with a                        cold snare. Resected and retrieved.                       - One 5 mm polyp in the cecum, removed with a cold                        biopsy forceps. Resected and retrieved.                       - Stool in the entire  examined colon.                       - Internal hemorrhoids. Recommendation:       - Return patient to hospital ward for ongoing care.                       - Resume previous diet today.                       - Continue present medications.                       - Manage constipation                       - Await pathology results.                       - Repeat colonoscopy in 3 years for surveillance of                        multiple polyps with 2 day prep.                       - Return to my office in 4 weeks. Procedure Code(s):    --- Professional ---                       [phone], Colonoscopy, flexible; with removal of tumor(s),  polyp(s), or other lesion(s) by snare technique                       [id number], 59, Colonoscopy, flexible; with biopsy, single                        or multiple Diagnosis Code(s):    --- Professional ---                       K64.8, Other hemorrhoids                       D12.0, Benign neoplasm of cecum                       R19.5, Other fecal abnormalities CPT copyright 2017 American Medical Association. All rights reserved. The codes documented in this report are preliminary and upon coder review may  be revised to meet current compliance requirements. Dr. Ulyess Mort Lin Landsman MD, MD 11/24/2017 2:26:16 PM This report has been signed electronically. Number of Addenda: 0 Note Initiated On: 11/24/2017 1:37 PM Scope Withdrawal Time: 0 hours 16 minutes 33 seconds  Total Procedure Duration: 0 hours 24 minutes 40 seconds       [facility]
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
## 2017-11-24 NOTE — Progress Notes (Signed)
Recreation Therapy Notes   Date: 11/24/2017  Time: 9:30 am  Location: Craft Room  Behavioral response: Appropriate   Intervention Topic: Team Work  Discussion/Intervention:  Group content on today was focused on teamwork. The group identified what teamwork is. Individuals described who is a part of their team. Patients expressed why they thought teamwork is important. The group stated reasons why they thought it was easier to work with a Dance movement psychotherapist team. Individuals discussed some positives and negatives of working with a team. Patients gave examples of past experiences they had while working with a team. The group participated in the intervention "Story in a bag", patients were in groups and were able to test their skill in a team setting.   Clinical Observations/Feedback:  Patient came to group and defined team work as working together to get something done. Individual was social with peers and staff while participating in the intervention.    LRT/CTRS             11/24/2017 12:20 PM

## 2017-11-24 NOTE — Anesthesia Post-op Follow-up Note (Signed)
Anesthesia QCDR form completed.        

## 2017-11-24 NOTE — Progress Notes (Signed)
Colonoscopy: Fair prep 3 polyps in cecum, resected Internal hemorrhoids  Recs Resume diet Miralax 17g BID for constipation Repeat colonoscopy in 3years F/u in gi clinic in 4weeks GI will sign off, please call back with questions  Cephas Darby, MD [place]  [place],  [address]  Main: [phone]  Fax: [phone] Pager: [phone]
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
## 2017-11-24 NOTE — [facility] Group Notes (Signed)
11/24/2017 1PM  Type of Therapy and Topic:  Group Therapy:  Feelings around Relapse and Recovery  Participation Level:  Active   Description of Group:    Patients in this group will discuss emotions they experience before and after a relapse. They will process how experiencing these feelings, or avoidance of experiencing them, relates to having a relapse. Facilitator will guide patients to explore emotions they have related to recovery. Patients will be encouraged to process which emotions are more powerful. They will be guided to discuss the emotional reaction significant others in their lives may have to patients' relapse or recovery. Patients will be assisted in exploring ways to respond to the emotions of others without this contributing to a relapse.  Therapeutic Goals: 1. Patient will identify two or more emotions that lead to a relapse for them 2. Patient will identify two emotions that result when they relapse 3. Patient will identify two emotions related to recovery 4. Patient will demonstrate ability to communicate their needs through discussion and/or role plays   Summary of Patient Progress: Pt. Came outside to practice self-care as a concept related to recovery.     Therapeutic Modalities:   Cognitive Behavioral Therapy Solution-Focused Therapy Assertiveness Training Relapse Prevention Therapy   Darin Engels, [place] 11/24/2017 1:56 PM
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
## 2017-11-24 NOTE — Plan of Care (Signed)
D: Patient denies SI/HI/AVH. Patient verbally contracts for safety. Patient is calm, cooperative and pleasant. Patient is seen in milieu interacting with peers. Patient has no complaints at this time.  A: Patient was assessed by this nurse. Patient was oriented to unit. Patient's safety was maintained on unit. Q x 15 minute observation checks were completed for safety. Patient care plan was reviewed. Patient was offered support and encouragement. Patient was encourage to attend groups, participate in unit activities and continue with plan of care.    R: Patient has no complaints of pain at this time. Patient is receptive to treatment and safety maintained on unit.     Problem: Education: Goal: Knowledge of [place] [facility] Education information/materials will improve Outcome: Progressing   Problem: Safety: Goal: Ability to disclose and discuss suicidal ideas will improve Outcome: Progressing   Problem: Safety: Goal: Ability to remain free from injury will improve Outcome: Progressing

## 2017-11-24 NOTE — Plan of Care (Signed)
Maintained patient on NPO status and continues colon prep,no further complain voiced at this time compliant with her medical regimen and aware of her coping skills, safety is maintained no distress 15 minute safety checks is in progress, denies any SI/HI at this time. Problem: Education: Goal: Knowledge of [place] [facility] Education information/materials will improve Outcome: Progressing Goal: Emotional status will improve Outcome: Progressing Goal: Mental status will improve Outcome: Progressing Goal: Verbalization of understanding the information provided will improve Outcome: Progressing   Problem: Safety: Goal: Periods of time without injury will increase Outcome: Progressing   Problem: Safety: Goal: Ability to disclose and discuss suicidal ideas will improve Outcome: Progressing   Problem: Safety: Goal: Ability to remain free from injury will improve Outcome: Progressing

## 2017-11-24 NOTE — Transfer of Care (Signed)
Immediate Anesthesia Transfer of Care Note  Patient: Teresa Jefferson  Procedure(s) Performed: COLONOSCOPY (N/A )  Patient Location: PACU  Anesthesia Type:General  Level of Consciousness: sedated  Airway & Oxygen Therapy: Patient Spontanous Breathing and Patient connected to nasal cannula oxygen  Post-op Assessment: Report given to RN and Post -op Vital signs reviewed and stable  Post vital signs: Reviewed and stable  Last Vitals:  Vitals Value Taken Time  BP    Temp    Pulse    Resp    SpO2      Last Pain:  Vitals:   11/24/17 0823  TempSrc:   PainSc: 0-No pain      Patients Stated Pain Goal: 0 (05/69/79 4801)  Complications: No apparent anesthesia complications

## 2017-11-25 DIAGNOSIS — F332 Major depressive disorder, recurrent severe without psychotic features: Principal | ICD-10-CM

## 2017-11-25 MED ORDER — LORATADINE 10 MG PO TABS
10.0000 mg | ORAL_TABLET | Freq: Every day | ORAL | Status: DC
  Administered 2017-11-25 – 2017-12-06 (×12): 10 mg via ORAL
  Filled 2017-11-25 (×12): qty 1

## 2017-11-25 NOTE — [facility] Group Notes (Signed)
[place] Group Notes:  (Nursing/MHT/Case Management/Adjunct)  Date:  11/25/2017  Time:  10:30 PM  Type of Therapy:  Group Therapy  Participation Level:  Active  Participation Quality:  Appropriate, Attentive and Sharing  Affect:  Appropriate  Cognitive:  Alert and Appropriate  Insight:  Appropriate and Good  Engagement in Group:  Engaged  Modes of Intervention:  Discussion, Exploration and Problem-solving  Summary of Progress/Problems: MHT addressed the messes that were left behind and encouraged patients to clean up after themselves. MHT reviewed visiting hours and the number of people allowed on the unit at one time. MHT informed patients of the phone hours. MHT informed patients of the time the day room would be shut down. MHT informed patients of wake-up call at 6am for vitals. MHT answered questions and concerns about the rules and expectations. MHT had patients introduce self and tell group what type of animal they would choose to be and why. MHT processed with group about decision making. MHT reviewed list of things to do when making decisions. MHT encouraged group to utilize the social workers while in group to address concerns and develop plans to address issue when released. MHT informed group they could be linked to providers once discharged. MHT informed group that IPRS funds were available for mental health services. MHT informed group they need to recognize a need exists to participate in treatment and medication management. Teresa Jefferson participated in group. Teresa Jefferson was engaged throughout group. Teresa Jefferson volunteered to use her problem to discuss the decision making process. Teresa Jefferson shared she wanted to go to rehab upon her discharge. MHT discussed making a decision of programs by length of staff. MHT gave 4 options to choose from while explaining the difference. MHT discussed detox treatment and explained she was not eligible for that. MHT discussed treatment of 30 days or less,  informing of [facility] and residential programs. MHT discussed the [facility] [employer] and the requirements. MHT discussed 1 year or longer program, such as [facility], and explained how they were ran. MHT explained how to evaluate the options and detemine the pros and cons of each. MHT explained that based on her needs, she should come up with the best choice to fit her needs. MHT informed Teresa Jefferson she knows herself best and knows what she needs to be successful in her recovery. MHT encouraged Teresa Jefferson to think about what she was doing when she was successful in her rehab and include those things in her road to recovery. Barnie Mort 11/25/2017, 10:30 PM

## 2017-11-25 NOTE — Progress Notes (Signed)
[facility] MD Progress Note  11/26/2017 7:53 AM SHAKEITA VANDEVANDER  MRN:  [id number]  Subjective:    Ms. Diven is very somatic. Today again she complains of eye redness. There is no redness. She is content otherwise waiting for [facility].  Principal Problem: Severe episode of recurrent major depressive disorder, without psychotic features ([place]) Diagnosis:   Patient Active Problem List   Diagnosis Date Noted  . Severe episode of recurrent major depressive disorder, without psychotic features ([place]) [F33.2] 11/16/2017    Priority: High  . Essential hypertension [I10] 11/17/2017  . Asthma [J45.909] 11/17/2017  . 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[place]) [Q22.97, F32.89] 11/17/2017  . Cocaine use disorder, severe, dependence ([place]) [F14.20] 11/17/2017  . Opioid use disorder, severe, dependence ([place]) [F11.20] 11/17/2017  . Opiate abuse, continuous ([place]) [F11.10] 11/16/2017  . Cocaine abuse ([place]) [F14.10] 11/16/2017  . Suicidal ideation [R45.851] 11/16/2017  . Chronic pain [G89.29] 11/16/2017   Total Time spent with patient: 20 minutes  Past Psychiatric History: depression, substance abuse  Past Medical History:  Past Medical History:  Diagnosis Date  . Bipolar 1 disorder ([place])   . Depression   . Hypertension   . Neuropathy   . Schizophrenia ([place])   . Thyroid disease     Past Surgical History:  Procedure Laterality Date  . ABDOMINAL HYSTERECTOMY    . KNEE SURGERY Right   . TONSILLECTOMY     Family History: History reviewed. No pertinent family history. Family Psychiatric  History: none Social History:  Social History   Substance and Sexual Activity  Alcohol Use No   Comment: occ     Social History   Substance and Sexual Activity  Drug Use Yes   Comment: crack and opiates    Social History   Socioeconomic History  . Marital status: Legally Separated    Spouse name: Not on file  . Number of children: Not on file  . Years of education: Not on file  .  Highest education level: Not on file  Occupational History  . Not on file  Social Needs  . Financial resource strain: Not on file  . Food insecurity:    Worry: Not on file    Inability: Not on file  . Transportation needs:    Medical: Not on file    Non-medical: Not on file  Tobacco Use  . Smoking status: Never Smoker  . Smokeless tobacco: Never Used  Substance and Sexual Activity  . Alcohol use: No    Comment: occ  . Drug use: Yes    Comment: crack and opiates  . Sexual activity: Not on file  Lifestyle  . Physical activity:    Days per week: Not on file    Minutes per session: Not on file  . Stress: Not on file  Relationships  . Social connections:    Talks on phone: Not on file    Gets together: Not on file    Attends religious service: Not on file    Active member of club or organization: Not on file    Attends meetings of clubs or organizations: Not on file    Relationship status: Not on file  Other Topics Concern  . Not on file  Social History Narrative  . Not on file   Additional Social History:                         Sleep: Fair  Appetite:  Fair  Current Medications: Current  Facility-Administered Medications  Medication Dose Route Frequency Provider Last Rate Last Dose  . 0.9 %  sodium chloride infusion   Intravenous Continuous Lin Landsman, MD 20 mL/hr at 11/23/17 1321 1,000 mL at 11/23/17 1321  . 0.9 %  sodium chloride infusion   Intravenous Continuous Vanga, Tally Due, MD      . acetaminophen (TYLENOL) tablet 650 mg  650 mg Oral Q6H PRN [name], [name], MD   650 mg at 11/23/17 0503  . albuterol (PROVENTIL HFA;VENTOLIN HFA) 108 (90 Base) MCG/ACT inhaler 1-2 puff  1-2 puff Inhalation Q6H PRN [name], MD      . alum & mag hydroxide-simeth (MAALOX/MYLANTA) 200-200-20 MG/5ML suspension 30 mL  30 mL Oral Q4H PRN [name], [name], MD      . amitriptyline (ELAVIL) tablet 25 mg  25 mg Oral QHS [name], MD   25 mg at 11/25/17 2133   . buprenorphine-naloxone (SUBOXONE) 8-2 mg per SL tablet 1 tablet  1 tablet Sublingual Daily [name], MD   1 tablet at 11/25/17 0837  . feeding supplement (ENSURE PRE-SURGERY) liquid 296 mL  296 mL Oral Once Vanga, Tally Due, MD      . gabapentin (NEURONTIN) capsule 300 mg  300 mg Oral TID PC [name], MD   300 mg at 11/25/17 2133  . hydrochlorothiazide (HYDRODIURIL) tablet 25 mg  25 mg Oral Daily [name], MD   25 mg at 11/25/17 0837  . hydrOXYzine (ATARAX/VISTARIL) tablet 50 mg  50 mg Oral TID PRN [name], [name], MD   50 mg at 11/25/17 2016  . ibuprofen (ADVIL,MOTRIN) tablet 600 mg  600 mg Oral Q6H PRN [name], MD   600 mg at 11/25/17 2135  . lamoTRIgine (LAMICTAL) tablet 25 mg  25 mg Oral Daily [name], MD   25 mg at 11/25/17 0837  . lisinopril (PRINIVIL,ZESTRIL) tablet 5 mg  5 mg Oral Daily [name], MD   5 mg at 11/25/17 0843  . loratadine (CLARITIN) tablet 10 mg  10 mg Oral Daily Pucilowska, Jolanta B, MD   10 mg at 11/25/17 1633  . magnesium hydroxide (MILK OF MAGNESIA) suspension 30 mL  30 mL Oral Daily PRN [name], [name], MD   30 mL at 11/21/17 2128  . neomycin-bacitracin-polymyxin (NEOSPORIN) ointment   Topical BID [name], MD   1 application at 82/42/35 1633  . ondansetron (ZOFRAN-ODT) disintegrating tablet 4 mg  4 mg Oral Q8H PRN [name], MD   4 mg at 11/23/17 1853  . QUEtiapine (SEROQUEL) tablet 400 mg  400 mg Oral QHS [name], [name], MD   400 mg at 11/25/17 2133    Lab Results: No results found for this or any previous visit (from the past 48 hour(s)).  Blood Alcohol level:  Lab Results  Component Value Date   ETH <10 11/16/2017   ETH <10 36/14/4315    Metabolic Disorder Labs: Lab Results  Component Value Date   HGBA1C 6.1 (H) 11/16/2017   MPG 128.37 11/16/2017   No results found for: PROLACTIN Lab Results  Component Value Date   CHOL 164 11/16/2017   TRIG 117 11/16/2017   HDL 60 11/16/2017   CHOLHDL 2.7  11/16/2017   VLDL 23 11/16/2017   LDLCALC 81 11/16/2017    Physical Findings: AIMS:  , ,  ,  ,    CIWA:    COWS:     Musculoskeletal: Strength & Muscle Tone: within normal limits Gait & Station:  normal Patient leans: N/A  Psychiatric Specialty Exam: Physical Exam  Nursing note and vitals reviewed. Psychiatric: Her speech is normal. Thought content normal. Her affect is angry. She is hyperactive. Cognition and memory are normal. She expresses impulsivity. She exhibits a depressed mood.    Review of Systems  Neurological: Negative.   Psychiatric/Behavioral: Positive for depression and substance abuse.  All other systems reviewed and are negative.   Blood pressure (!) 146/88, pulse 80, temperature 97.7 F (36.5 C), temperature source Oral, resp. rate 18, height 5\' 2"  (1.575 m), weight 78 kg (172 lb), SpO2 100 %.Body mass index is 31.46 kg/m.  General Appearance: Casual  Eye Contact:  Good  Speech:  Pressured  Volume:  Increased  Mood:  Irritable  Affect:  Congruent  Thought Process:  Goal Directed and Descriptions of Associations: Intact  Orientation:  Full (Time, Place, and Person)  Thought Content:  WDL  Suicidal Thoughts:  No  Homicidal Thoughts:  No  Memory:  Immediate;   Fair Recent;   Fair Remote;   Good  Judgement:  Poor  Insight:  Lacking  Psychomotor Activity:  Normal  Concentration:  Concentration: Fair and Attention Span: Fair  Recall:  [employer] of Knowledge:  Fair  Language:  Fair  Akathisia:  No  Handed:  Right  AIMS (if indicated):     Assets:  Communication Skills Desire for Improvement Physical Health Resilience  ADL's:  Intact  Cognition:  WNL  Sleep:  Number of Hours: 6.15     Treatment Plan Summary: Daily contact with patient to assess and evaluate symptoms and progress in treatment and Medication management   Ms. Cerney is a 52 year old female with a history of depression, mood instability and substance abuse, on suboxone,  awaiting rehab.  Major Depressive Disorder, recurrent, severe; Substance-induced Depression; History of Bipolar Disorder; Anxiety disorder, unspecified. Mood improving slowly.  -Continue seroquel 400 mg nightly -Continue lamictal 25 mg daily -Elavil 25 mg nightly started 11/23/2017 -QTc 467  Opioid use disorder, severe -suboxone 8-2 mg BID reduced to once a day on 11/20/17  Cocaine use disorder, severe with associated withdrawal symptoms -monitor BP -hydroxyzine 50 mg TID PRN anxiety  Hypertension, stable -HCTZ and lisinopril -monitor potassium  Chronic pain, neuropathic -gabapentin 300 mg TID  Ear/neck pain -  per ENT, ear abx dropcompleted  Abdominal pain -H/H stable -KUB shows "scattered" stool, no impaction -Hemoccult is positive for blood. GI consulted and pt scheduled for colonoscopy on 7/12.  Disposition: residential substance abuse rehab Tentative discharge: 3-5 days  11/25/2017 No medication adjustments, therapy, supportive and substance abuse, provided.Ordered regular diet as the patient felt we were "starving" her. 11/26/2017 Clear eyes to both eyes.     Orson Slick, MD 11/26/2017, 7:53 AM

## 2017-11-25 NOTE — BHH Group Notes (Signed)
[facility] LCSW Group Therapy Note  Date/Time:  11/25/2017 1PM  Type of Therapy and Topic:  Group Therapy:  Healthy and Unhealthy Supports  Participation Level:  Active   Description of Group:  Patients in this group were introduced to the idea of adding a variety of healthy supports to address the various needs in their lives.Patients discussed what additional healthy supports could be helpful in their recovery and wellness after discharge in order to prevent future hospitalizations.   An emphasis was placed on using counselor, doctor, therapy groups, 12-step groups, and problem-specific support groups to expand supports.  They also worked as a group on developing a specific plan for several patients to deal with unhealthy supports through [place], psychoeducation with loved ones, and even termination of relationships.   Therapeutic Goals:   1)  discuss importance of adding supports to stay well once out of the hospital  2)  compare healthy versus unhealthy supports and identify some examples of each  3)  generate ideas and descriptions of healthy supports that can be added  4)  offer mutual support about how to address unhealthy supports  5)  encourage active participation in and adherence to discharge plan    Summary of Patient Progress:  The patient stated that current healthy supports in her life are Her family now while current unhealthy supports include some other family members.  The patient expressed a willingness to add  Going to church as support(s) to help in her recovery journey.   Therapeutic Modalities:   Motivational Interviewing Brief Solution-Focused Therapy  Darin Engels, [place] 11/25/2017 2:07PM

## 2017-11-25 NOTE — Plan of Care (Signed)
Patient is alert and oriented X 4. Patient is pleasant and compliant with medications. Patient affect is anxious and very visibile. Patient complains of back pain and right hip; managed with ibuprofen. Patient is eating meals regularly and sleeping well during the night. Patient would like for allergy medication to be ordered Twice daily. Blood pressure 116/67 during noon.Nurse will continue to monitor. Problem: Education: Goal: Knowledge of [place] [facility] Education information/materials will improve Outcome: Progressing Goal: Emotional status will improve Outcome: Progressing Goal: Mental status will improve Outcome: Progressing Goal: Verbalization of understanding the information provided will improve Outcome: Progressing   Problem: Safety: Goal: Periods of time without injury will increase Outcome: Progressing   Problem: Safety: Goal: Ability to disclose and discuss suicidal ideas will improve Outcome: Progressing   Problem: Safety: Goal: Ability to remain free from injury will improve Outcome: Progressing

## 2017-11-25 NOTE — Plan of Care (Signed)
Patient is stable and tolerating her regimen well and states she feeling fine , denies any concerns, participate in groups and take her meds. Denies SI/HI/AVH 15 minutes rounding in progress. Problem: Education: Goal: Knowledge of  General Education information/materials will improve Outcome: Progressing Goal: Emotional status will improve Outcome: Progressing Goal: Mental status will improve Outcome: Progressing Goal: Verbalization of understanding the information provided will improve Outcome: Progressing   Problem: Safety: Goal: Periods of time without injury will increase Outcome: Progressing   Problem: Safety: Goal: Ability to disclose and discuss suicidal ideas will improve Outcome: Progressing   Problem: Safety: Goal: Ability to remain free from injury will improve Outcome: Progressing

## 2017-11-25 NOTE — Progress Notes (Addendum)
[facility] MD Progress Note  11/25/2017 1:07 PM Teresa Jefferson  MRN:  [id number]  Subjective:    Ms. Teresa Jefferson had a long chat with me. Polyps were removed yesterday during colonoscopy ans she will need another one in 3 years. Still feels depressed but hopes that new medication adjustments will be helpful. She tells me that she is now on Lamictal and Amitriptyline. She asks for Claritine BID and eye exam. She is determined to continue in residential treatment and is willing to stay in the hospital "as long as necessary" to get well. Tolerates medications well.   Principal Problem: Severe episode of recurrent major depressive disorder, without psychotic features ([place]) Diagnosis:   Patient Active Problem List   Diagnosis Date Noted  . Severe episode of recurrent major depressive disorder, without psychotic features ([place]) [F33.2] 11/16/2017    Priority: High  . Essential hypertension [I10] 11/17/2017  . Asthma [J45.909] 11/17/2017  . 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[place]) [W23.76, F32.89] 11/17/2017  . Cocaine use disorder, severe, dependence ([place]) [F14.20] 11/17/2017  . Opioid use disorder, severe, dependence ([place]) [F11.20] 11/17/2017  . Opiate abuse, continuous ([place]) [F11.10] 11/16/2017  . Cocaine abuse ([place]) [F14.10] 11/16/2017  . Suicidal ideation [R45.851] 11/16/2017  . Chronic pain [G89.29] 11/16/2017   Total Time spent with patient: 20 minutes  Past Psychiatric History: depression, substance abuse  Past Medical History:  Past Medical History:  Diagnosis Date  . Bipolar 1 disorder ([place])   . Depression   . Hypertension   . Neuropathy   . Schizophrenia ([place])   . Thyroid disease     Past Surgical History:  Procedure Laterality Date  . ABDOMINAL HYSTERECTOMY    . KNEE SURGERY Right   . TONSILLECTOMY     Family History: History reviewed. No pertinent family history. Family Psychiatric  History: unknown Social History:  Social History   Substance and  Sexual Activity  Alcohol Use No   Comment: occ     Social History   Substance and Sexual Activity  Drug Use Yes   Comment: crack and opiates    Social History   Socioeconomic History  . Marital status: Legally Separated    Spouse name: Not on file  . Number of children: Not on file  . Years of education: Not on file  . Highest education level: Not on file  Occupational History  . Not on file  Social Needs  . Financial resource strain: Not on file  . Food insecurity:    Worry: Not on file    Inability: Not on file  . Transportation needs:    Medical: Not on file    Non-medical: Not on file  Tobacco Use  . Smoking status: Never Smoker  . Smokeless tobacco: Never Used  Substance and Sexual Activity  . Alcohol use: No    Comment: occ  . Drug use: Yes    Comment: crack and opiates  . Sexual activity: Not on file  Lifestyle  . Physical activity:    Days per week: Not on file    Minutes per session: Not on file  . Stress: Not on file  Relationships  . Social connections:    Talks on phone: Not on file    Gets together: Not on file    Attends religious service: Not on file    Active member of club or organization: Not on file    Attends meetings of clubs or organizations: Not on file    Relationship status: Not on  file  Other Topics Concern  . Not on file  Social History Narrative  . Not on file   Additional Social History:                         Sleep: Fair  Appetite:  Fair  Current Medications: Current Facility-Administered Medications  Medication Dose Route Frequency Provider Last Rate Last Dose  . 0.9 %  sodium chloride infusion   Intravenous Continuous Lin Landsman, MD 20 mL/hr at 11/23/17 1321 1,000 mL at 11/23/17 1321  . 0.9 %  sodium chloride infusion   Intravenous Continuous Vanga, Tally Due, MD      . acetaminophen (TYLENOL) tablet 650 mg  650 mg Oral Q6H PRN [name], [name], MD   650 mg at 11/23/17 0503  . albuterol  (PROVENTIL HFA;VENTOLIN HFA) 108 (90 Base) MCG/ACT inhaler 1-2 puff  1-2 puff Inhalation Q6H PRN [name], MD      . alum & mag hydroxide-simeth (MAALOX/MYLANTA) 200-200-20 MG/5ML suspension 30 mL  30 mL Oral Q4H PRN [name], [name], MD      . amitriptyline (ELAVIL) tablet 25 mg  25 mg Oral QHS [name], MD   25 mg at 11/24/17 2128  . buprenorphine-naloxone (SUBOXONE) 8-2 mg per SL tablet 1 tablet  1 tablet Sublingual Daily [name], MD   1 tablet at 11/25/17 0837  . feeding supplement (ENSURE PRE-SURGERY) liquid 296 mL  296 mL Oral Once Vanga, Tally Due, MD      . gabapentin (NEURONTIN) capsule 300 mg  300 mg Oral TID PC [name], MD   300 mg at 11/25/17 0837  . hydrochlorothiazide (HYDRODIURIL) tablet 25 mg  25 mg Oral Daily [name], MD   25 mg at 11/25/17 0837  . hydrOXYzine (ATARAX/VISTARIL) tablet 50 mg  50 mg Oral TID PRN [name], [name], MD   50 mg at 11/21/17 2004  . ibuprofen (ADVIL,MOTRIN) tablet 600 mg  600 mg Oral Q6H PRN [name], MD   600 mg at 11/25/17 0840  . lamoTRIgine (LAMICTAL) tablet 25 mg  25 mg Oral Daily [name], MD   25 mg at 11/25/17 0837  . lisinopril (PRINIVIL,ZESTRIL) tablet 5 mg  5 mg Oral Daily [name], MD   5 mg at 11/25/17 0843  . loratadine (CLARITIN) tablet 10 mg  10 mg Oral Daily [name], MD   10 mg at 11/25/17 0837  . magnesium hydroxide (MILK OF MAGNESIA) suspension 30 mL  30 mL Oral Daily PRN [name], [name], MD   30 mL at 11/21/17 2128  . neomycin-bacitracin-polymyxin (NEOSPORIN) ointment   Topical BID [name], MD   1 application at 16/60/63 1615  . ondansetron (ZOFRAN-ODT) disintegrating tablet 4 mg  4 mg Oral Q8H PRN [name], MD   4 mg at 11/23/17 1853  . QUEtiapine (SEROQUEL) tablet 400 mg  400 mg Oral QHS [name], [name], MD   400 mg at 11/24/17 2128    Lab Results: No results found for this or any previous visit (from the past 48 hour(s)).  Blood Alcohol level:  Lab Results   Component Value Date   ETH <10 11/16/2017   ETH <10 01/60/1093    Metabolic Disorder Labs: Lab Results  Component Value Date   HGBA1C 6.1 (H) 11/16/2017   MPG 128.37 11/16/2017   No results found for: PROLACTIN Lab Results  Component Value Date   CHOL 164 11/16/2017   TRIG 117  11/16/2017   HDL 60 11/16/2017   CHOLHDL 2.7 11/16/2017   VLDL 23 11/16/2017   LDLCALC 81 11/16/2017    Physical Findings: AIMS:  , ,  ,  ,    CIWA:    COWS:     Musculoskeletal: Strength & Muscle Tone: within normal limits Gait & Station: normal Patient leans: N/A  Psychiatric Specialty Exam: Physical Exam  Nursing note and vitals reviewed. Psychiatric: Her speech is normal and behavior is normal. Thought content normal. Cognition and memory are normal. She expresses impulsivity. She exhibits a depressed mood.    Review of Systems  Neurological: Negative.   Psychiatric/Behavioral: Positive for depression and substance abuse.  All other systems reviewed and are negative.   Blood pressure (!) 129/95, pulse 69, temperature 98.6 F (37 C), temperature source Oral, resp. rate 18, height 5\' 2"  (1.575 m), weight 78 kg (172 lb), SpO2 100 %.Body mass index is 31.46 kg/m.  General Appearance: Casual  Eye Contact:  Good  Speech:  Clear and Coherent  Volume:  Normal  Mood:  Depressed  Affect:  Flat  Thought Process:  Goal Directed and Descriptions of Associations: Intact  Orientation:  Full (Time, Place, and Person)  Thought Content:  WDL  Suicidal Thoughts:  No  Homicidal Thoughts:  No  Memory:  Immediate;   Fair Recent;   Fair Remote;   Fair  Judgement:  Poor  Insight:  Lacking  Psychomotor Activity:  Normal  Concentration:  Concentration: Fair and Attention Span: Fair  Recall:  [employer] of Knowledge:  Fair  Language:  Fair  Akathisia:  Negative  Handed:  Right  AIMS (if indicated):     Assets:  Communication Skills Desire for Improvement Physical Health Resilience  ADL's:   Intact  Cognition:  WNL  Sleep:  Number of Hours: 5.45     Treatment Plan Summary: Daily contact with patient to assess and evaluate symptoms and progress in treatment and Medication management   Ms. Fader is a 52 year old female with a history of depression, mood instability and substance abuse, on suboxone, awaiting rehab.  Major Depressive Disorder, recurrent, severe; Substance-induced Depression; History of Bipolar Disorder; Anxiety disorder, unspecified. Mood improving slowly.  -Continue seroquel 400 mg nightly -Continue lamictal 25 mg daily -Elavil 25 mg nightly started 11/23/2017 -QTc 467  Opioid use disorder, severe -suboxone 8-2 mg BID reduced to once a day on 11/20/17  Cocaine use disorder, severe with associated withdrawal symptoms -monitor BP -hydroxyzine 50 mg TID PRN anxiety  Hypertension, stable -HCTZ and lisinopril -monitor potassium  Chronic pain, neuropathic -gabapentin 300 mg TID  Ear/neck pain -  per ENT, ear abx dropcompleted  Abdominal pain -H/H stable -KUB shows "scattered" stool, no impaction -Hemoccult is positive for blood. GI consulted and pt scheduled for colonoscopy on 7/12.  Disposition: residential substance abuse rehab Tentative discharge: 3-5 days  11/25/2017 No medication adjustments, therapy, supportive and substance abuse, provided.    Orson Slick, MD 11/25/2017, 1:07 PM

## 2017-11-25 NOTE — Anesthesia Postprocedure Evaluation (Signed)
Anesthesia Post Note  Patient: Teresa Jefferson  Procedure(s) Performed: COLONOSCOPY (N/A )  Patient location during evaluation: Endoscopy Anesthesia Type: General Level of consciousness: awake and alert Pain management: pain level controlled Vital Signs Assessment: post-procedure vital signs reviewed and stable Respiratory status: spontaneous breathing, nonlabored ventilation, respiratory function stable and patient connected to nasal cannula oxygen Cardiovascular status: blood pressure returned to baseline and stable Postop Assessment: no apparent nausea or vomiting Anesthetic complications: no     Last Vitals:  Vitals:   11/24/17 1500 11/25/17 0611  BP: (!) 173/109 (!) 129/95  Pulse:  69  Resp:  18  Temp:  37 C  SpO2:  100%    Last Pain:  Vitals:   11/25/17 0611  TempSrc: Oral  PainSc:                  Martha Clan

## 2017-11-26 ENCOUNTER — Encounter: Payer: Self-pay | Admitting: Gastroenterology

## 2017-11-26 MED ORDER — NAPHAZOLINE-PHENIRAMINE 0.025-0.3 % OP SOLN
2.0000 [drp] | Freq: Four times a day (QID) | OPHTHALMIC | Status: DC | PRN
  Administered 2017-11-26 – 2017-12-05 (×10): 2 [drp] via OPHTHALMIC
  Filled 2017-11-26: qty 15

## 2017-11-26 NOTE — Progress Notes (Signed)
Nursing note 7p-7a  Pt observed interacting with peers on unit this shift. Displayed a anxious affect and mood upon interaction with this Probation officer. Pt complains of pain in lower back 5/10, anxiety 9/10 ,denies SI/HI, and also denies any audio or visual hallucinations at this time. Pt complains of red eyes and at nurses station multiple time during the shift requesting eyes to be assessed. Upon assessment by this RN and charge RN eyes WNL. Pt complains of itching redness and burning. Encouraged pt to not touch or rub eyes, to place cold compress to relieve symptoms. Pt needed redirection regarding increased anxiety due to not having eyedrops prescribed. Pt up to nurses station c/o feeling like her blood pressure was elevated. BP assessed and WNL. Pt redirected and returned to bed without issue.Pt is able to verbally contract for safety with this RN. Goal: " to get my eyes checked"  Pt is now resting in bed with eyes closed, with no signs or symptoms of pain or distress noted. Pt continues to remain safe on the unit and is observed by rounding every 15 min. RN will continue to monitor.

## 2017-11-26 NOTE — Plan of Care (Signed)
Pt continues to progress towards goals and d/c. RN will continue to monitor.  

## 2017-11-26 NOTE — Progress Notes (Addendum)
[place] Group Notes:  (Nursing/MHT/Case Management/Adjunct)  Date:  11/26/2017  Time:  9:25 PM  Type of Therapy:  Wrap up group  Participation Level:  Active  Participation Quality:  Appropriate and Sharing  Affect:  Appropriate  Cognitive:  Alert and Appropriate  Insight:  Appropriate and Good  Engagement in Group:  Engaged  Modes of Intervention:  Discussion  Summary of Progress/Problems: Jadah shared with the group how she rated her day as a 3 until after 3pm she rated it a 10. She stated today started out slow but it became better.  Anuja is expecting tomorrow to be better.   Blenda [place]s [place] 11/26/2017, 9:25 PM

## 2017-11-26 NOTE — [facility] Group Notes (Addendum)
LCSW Group Therapy Note   11/26/2017 1:15pm   Type of Therapy and Topic:  Group Therapy:  Positive Affirmations   Participation Level:  Active  Description of Group: This group addressed positive affirmation toward self and others. Patients went around the room and identified two positive things about themselves and two positive things about a peer in the room. Patients reflected on how it felt to share something positive with others, to identify positive things about themselves, and to hear positive things from others. Patients were encouraged to have a daily reflection of positive characteristics or circumstances.  Therapeutic Goals 1. Patient will verbalize two of their positive qualities 2. Patient will demonstrate empathy for others by stating two positive qualities about a peer in the group 3. Patient will verbalize their feelings when voicing positive self affirmations and when voicing positive affirmations of others 4. Patients will discuss the potential positive impact on their wellness/recovery of focusing on positive traits of self and others. Summary of Patient Progress:  Stayed the entire time, engaged throughout.  Talked about coming here to get help with the support of her family, and how "I am visualizing a positive future."  Admitted she has been through this before, "but this time it is different. I will not go back to my husband, and I will go to treatment for opiate addiction, and after that I am not worried because that is too far out to even think about." Was very encouraging and supportive with other group members.  Therapeutic Modalities Cognitive Behavioral Therapy Motivational [place], [place] 11/26/2017 2:55 PM

## 2017-11-26 NOTE — Progress Notes (Addendum)
D- Patient alert and oriented. Patient presents in a pleasant mood on assessment stating that she slept "good" last night and that she feels "better today than yesterday". Patient had complaints of "sharp pain" in her lower back and she did request medication from this Probation officer. Patient rates her depression a "4/10" stating that she's feeling this way because of "thinking about my husband and family and being homeless". Patient denies SI, HI, AVH, at this time. Patient also anxiety. Patient's goal for today is "to make today a better day than yesterday".  A- Scheduled medications administered to patient, per MD orders. Support and encouragement provided.  Routine safety checks conducted every 15 minutes.  Patient informed to notify staff with problems or concerns.  R- No adverse drug reactions noted. Patient contracts for safety at this time. Patient compliant with medications and treatment plan. Patient receptive, calm, and cooperative. Patient interacts well with others on the unit.  Patient remains safe at this time.

## 2017-11-27 DIAGNOSIS — F1420 Cocaine dependence, uncomplicated: Secondary | ICD-10-CM

## 2017-11-27 DIAGNOSIS — F333 Major depressive disorder, recurrent, severe with psychotic symptoms: Secondary | ICD-10-CM

## 2017-11-27 DIAGNOSIS — F1120 Opioid dependence, uncomplicated: Secondary | ICD-10-CM

## 2017-11-27 MED ORDER — POLYETHYLENE GLYCOL 3350 17 G PO PACK
17.0000 g | PACK | Freq: Two times a day (BID) | ORAL | Status: DC
  Administered 2017-11-27 – 2017-12-05 (×15): 17 g via ORAL
  Filled 2017-11-27 (×14): qty 1

## 2017-11-27 NOTE — Anesthesia Postprocedure Evaluation (Signed)
Anesthesia Post Note  Patient: CARMAN AUXIER  Procedure(s) Performed: COLONOSCOPY (N/A )  Patient location during evaluation: Endoscopy Anesthesia Type: General Level of consciousness: awake and alert Pain management: pain level controlled Vital Signs Assessment: post-procedure vital signs reviewed and stable Respiratory status: spontaneous breathing, nonlabored ventilation and respiratory function stable Cardiovascular status: blood pressure returned to baseline and stable Postop Assessment: no apparent nausea or vomiting Anesthetic complications: no     Last Vitals:  Vitals:   11/27/17 0610 11/27/17 0611  BP: (!) 134/92 110/85  Pulse: 84 89  Resp: 16   Temp: 36.6 C   SpO2: 100% 97%    Last Pain:  Vitals:   11/27/17 0610  TempSrc: Oral  PainSc:                  Alphonsus Sias

## 2017-11-27 NOTE — Plan of Care (Signed)
Improving with emotional and mental status . Voice of no safety concerns . Denies suicidal ideations. Attending unit programing . Interacting  with  peers and staff Patient understanding of information received from staff   Problem: Education: Goal: Knowledge of [facility] Education information/materials will improve Outcome: Progressing Goal: Emotional status will improve Outcome: Progressing Goal: Mental status will improve Outcome: Progressing Goal: Verbalization of understanding the information provided will improve Outcome: Progressing   Problem: Safety: Goal: Periods of time without injury will increase Outcome: Progressing   Problem: Safety: Goal: Ability to disclose and discuss suicidal ideas will improve Outcome: Progressing   Problem: Safety: Goal: Ability to remain free from injury will improve Outcome: Progressing

## 2017-11-27 NOTE — Progress Notes (Signed)
Nursing note 7p-7a  Pt observed interacting with peers on unit this shift. Displayed a bright affect and cheerful mood upon interaction with this Probation officer. Stated that she felt depressed after her talk with Dr Mamie Nick this afternoon. Pt c/o itchy spots on neck and arm. Requesting benadryl cream for this. Pt complains of back pain and itchy eyes. See MAR for prn medication administration , Pt denies SI/HI, and also denies any audio or visual hallucinations at this time. Pt is able to verbally contract for safety with this RN Pt is now resting in bed with eyes closed, with no signs or symptoms of pain or distress noted. Pt continues to remain safe on the unit and is observed by rounding every 15 min. RN will continue to monitor.

## 2017-11-27 NOTE — Progress Notes (Signed)
D: Patient stated slept good last night .Stated appetite is good and energy level  Is normal. Stated concentration npoor. Stated on Depression scale 5 , hopeless 5 and anxiety 6 .( low 0-10 high) Denies suicidal  homicidal ideations  .  No auditory hallucinations  No pain concerns . Appropriate ADL'S. Interacting with peers and staff.  A: Encourage patient participation with unit programming . Instruction  Given on  Medication , verbalize understanding. R: Voice no other concerns. Staff continue to monitor

## 2017-11-27 NOTE — Progress Notes (Signed)
Recreation Therapy Notes  Date: 11/27/2017  Time: 9:30 am  Location: Craft Room  Behavioral response: Appropriate    Intervention Topic: Stress  Discussion/Intervention:  Group content on today was focused on stress. The group defined stress and ways to cope with stress. Participants expressed how they know when they are stresses out. Individuals described the different ways they have to cope with stress. The group stated reasons why it is important to cope with stress. Patient explained what good stress is and some examples. The group participated in the intervention "Stress Management Jeopardy". Individuals were separated into two group and answered questions related to stress.   Clinical Observations/Feedback:  Patient came to group and identified her health as a stressor. Individual identified talking to help her deal with her stress. She was social with peers and staff while participating in the intervention.   LRT/CTRS            11/27/2017 12:54 PM

## 2017-11-27 NOTE — [facility] Group Notes (Signed)
[place] Group Notes:  (Nursing/MHT/Case Management/Adjunct)  Date:  11/27/2017  Time:  9:28 PM  Type of Therapy:  Group Therapy  Participation Level:  Active  Participation Quality:  Appropriate and Sharing  Affect:  Appropriate  Cognitive:  Appropriate  Insight:  Good  Engagement in Group:  Engaged and Supportive  Modes of Intervention:  Education and Support  Summary of Progress/Problems patient shared that she was able to complete her goal today of getting along with all the clinical staff. Patient states "I remembered the saying 'treat someone the way you want to be treated' and that worked well for me today." Bobby Rumpf 11/27/2017, 9:28 PM

## 2017-11-27 NOTE — Progress Notes (Signed)
[facility] MD Progress Note  11/27/2017 3:25 PM IONA STAY  MRN:  [id number]  Subjective:   Patient reports she initially started out having a good weekend, but she was displeased with her interaction with certain staff, so she felt like that decreased her mood.  Pt states she is feeling better this morning.  We discussed the colonoscopy results.  She states that she hasn't had a BM since the colonoscopy, so she's willing to continue with the miralax that was recommended by the GI consultant.    Pt feels her medications are working fine. She is sleeping well with the seroquel and elavil.  She also believes the elavil is helping for her depression and anxiety. She is hopeful to get accepted to [facility].  Pt is motivated to stay clean off opiates.  She would like to continue with the suboxone.     Principal Problem: Severe episode of recurrent major depressive disorder, without psychotic features ([place]) Diagnosis:   Patient Active Problem List   Diagnosis Date Noted  . Essential hypertension [I10] 11/17/2017  . Asthma [J45.909] 11/17/2017  . 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 Cocaine use disorder, severe, dependence ([place]) [F14.20] 11/17/2017  . Opioid use disorder, severe, dependence ([place]) [F11.20] 11/17/2017  . Severe episode of recurrent major depressive disorder, without psychotic features ([place]) [F33.2] 11/16/2017  . Opiate abuse, continuous ([place]) [F11.10] 11/16/2017  . Cocaine abuse ([place]) [F14.10] 11/16/2017  . Suicidal ideation [R45.851] 11/16/2017  . Chronic pain [G89.29] 11/16/2017   Total Time spent with patient, discussing pt with treatment team, reviewing chart, and providing supportive therapy.: 25 min  Past Psychiatric History: depression, substance abuse  Past Medical History:  Past Medical History:  Diagnosis Date  . Bipolar 1 disorder ([place])   . Depression   . Hypertension   . Neuropathy   . Schizophrenia ([place])   .  Thyroid disease     Past Surgical History:  Procedure Laterality Date  . ABDOMINAL HYSTERECTOMY    . COLONOSCOPY N/A 11/23/2017   Procedure: COLONOSCOPY;  Surgeon: Lin Landsman, MD;  Location: [facility] ENDOSCOPY;  Service: Gastroenterology;  Laterality: N/A;  . COLONOSCOPY N/A 11/24/2017   Procedure: COLONOSCOPY;  Surgeon: Lin Landsman, MD;  Location: [facility] ENDOSCOPY;  Service: Gastroenterology;  Laterality: N/A;  . KNEE SURGERY Right   . TONSILLECTOMY     Family History: History reviewed. No pertinent family history. Family Psychiatric  History: see H&P Social History:  Social History   Substance and Sexual Activity  Alcohol Use No   Comment: occ     Social History   Substance and Sexual Activity  Drug Use Yes   Comment: crack and opiates    Social History   Socioeconomic History  . Marital status: Legally Separated    Spouse name: Not on file  . Number of children: Not on file  . Years of education: Not on file  . Highest education level: Not on file  Occupational History  . Not on file  Social Needs  . Financial resource strain: Not on file  . Food insecurity:    Worry: Not on file    Inability: Not on file  . Transportation needs:    Medical: Not on file    Non-medical: Not on file  Tobacco Use  . Smoking status: Never Smoker  . Smokeless tobacco: Never Used  Substance and Sexual Activity  . Alcohol use: No    Comment: occ  . Drug use: Yes  Comment: crack and opiates  . Sexual activity: Not on file  Lifestyle  . Physical activity:    Days per week: Not on file    Minutes per session: Not on file  . Stress: Not on file  Relationships  . Social connections:    Talks on phone: Not on file    Gets together: Not on file    Attends religious service: Not on file    Active member of club or organization: Not on file    Attends meetings of clubs or organizations: Not on file    Relationship status: Not on file  Other Topics Concern  . Not on  file  Social History Narrative  . Not on file   Additional Social History: see H&P  Sleep: Fair  Appetite:  Fair  Current Medications: Current Facility-Administered Medications  Medication Dose Route Frequency Provider Last Rate Last Dose  . 0.9 %  sodium chloride infusion   Intravenous Continuous Lin Landsman, MD 20 mL/hr at 11/23/17 1321 1,000 mL at 11/23/17 1321  . 0.9 %  sodium chloride infusion   Intravenous Continuous [place], Tally Due, MD      . acetaminophen (TYLENOL) tablet 650 mg  650 mg Oral Q6H PRN [name], [name], MD   650 mg at 11/23/17 0503  . albuterol (PROVENTIL HFA;VENTOLIN HFA) 108 (90 Base) MCG/ACT inhaler 1-2 puff  1-2 puff Inhalation Q6H PRN [name], MD      . alum & mag hydroxide-simeth (MAALOX/MYLANTA) 200-200-20 MG/5ML suspension 30 mL  30 mL Oral Q4H PRN [name], [name], MD      . amitriptyline (ELAVIL) tablet 25 mg  25 mg Oral QHS [name], MD   25 mg at 11/26/17 2135  . buprenorphine-naloxone (SUBOXONE) 8-2 mg per SL tablet 1 tablet  1 tablet Sublingual Daily [name], MD   1 tablet at 11/27/17 0803  . gabapentin (NEURONTIN) capsule 300 mg  300 mg Oral TID PC [name], MD   300 mg at 11/27/17 1416  . hydrochlorothiazide (HYDRODIURIL) tablet 25 mg  25 mg Oral Daily [name], MD   25 mg at 11/27/17 0802  . hydrOXYzine (ATARAX/VISTARIL) tablet 50 mg  50 mg Oral TID PRN [name], [name], MD   50 mg at 11/26/17 2140  . ibuprofen (ADVIL,MOTRIN) tablet 600 mg  600 mg Oral Q6H PRN [name], MD   600 mg at 11/27/17 0927  . lamoTRIgine (LAMICTAL) tablet 25 mg  25 mg Oral Daily [name], MD   25 mg at 11/27/17 0802  . lisinopril (PRINIVIL,ZESTRIL) tablet 5 mg  5 mg Oral Daily [name], MD   5 mg at 11/27/17 0802  . loratadine (CLARITIN) tablet 10 mg  10 mg Oral Daily Pucilowska, Jolanta B, MD   10 mg at 11/27/17 0802  . magnesium hydroxide (MILK OF MAGNESIA) suspension 30 mL  30 mL Oral Daily PRN [name], [name], MD    30 mL at 11/21/17 2128  . naphazoline-pheniramine (NAPHCON-A) 0.025-0.3 % ophthalmic solution 2 drop  2 drop Both Eyes QID PRN Pucilowska, Jolanta B, MD   2 drop at 11/27/17 0926  . neomycin-bacitracin-polymyxin (NEOSPORIN) ointment   Topical BID [name], MD   1 application at 50/27/74 0802  . ondansetron (ZOFRAN-ODT) disintegrating tablet 4 mg  4 mg Oral Q8H PRN [name], MD   4 mg at 11/23/17 1853  . polyethylene glycol (MIRALAX / GLYCOLAX) packet 17 g  17 g Oral BID [name], MD      .  QUEtiapine (SEROQUEL) tablet 400 mg  400 mg Oral QHS [name], [name], MD   400 mg at 11/26/17 2135    Lab Results: No results found for this or any previous visit (from the past 48 hour(s)).  Blood Alcohol level:  Lab Results  Component Value Date   ETH <10 11/16/2017   ETH <10 95/18/8416    Metabolic Disorder Labs: Lab Results  Component Value Date   HGBA1C 6.1 (H) 11/16/2017   MPG 128.37 11/16/2017   No results found for: PROLACTIN Lab Results  Component Value Date   CHOL 164 11/16/2017   TRIG 117 11/16/2017   HDL 60 11/16/2017   CHOLHDL 2.7 11/16/2017   VLDL 23 11/16/2017   LDLCALC 81 11/16/2017    Physical Findings: AIMS: Facial and Oral Movements Muscles of Facial Expression: None, normal Lips and Perioral Area: None, normal Jaw: None, normal Tongue: None, normal,Extremity Movements Upper (arms, wrists, hands, fingers): None, normal Lower (legs, knees, ankles, toes): None, normal,  , Overall Severity Severity of abnormal movements (highest score from questions above): None, normal Incapacitation due to abnormal movements: None, normal Patient's awareness of abnormal movements (rate only patient's report): No Awareness, Dental Status Current problems with teeth and/or dentures?: No Does patient usually wear dentures?: No  CIWA:    COWS:     Musculoskeletal: Strength & Muscle Tone: within normal limits Gait & Station: normal Patient leans: N/A  Psychiatric  Specialty Exam: Physical Exam  Nursing note and vitals reviewed. Psychiatric: Her speech is normal. Judgment and thought content normal. Her mood appears not anxious. Her affect is not angry. She is not agitated. Cognition and memory are normal. She does not exhibit a depressed mood.    Review of Systems  Neurological: Negative.   Psychiatric/Behavioral: Negative for depression.  All other systems reviewed and are negative.   Blood pressure 110/85, pulse 89, temperature 97.8 F (36.6 C), temperature source Oral, resp. rate 16, height 5\' 2"  (1.575 m), weight 78 kg (172 lb), SpO2 97 %.Body mass index is 31.46 kg/m.  General Appearance: Casual  Eye Contact:  Good  Speech:  Pressured  Volume:  Increased  Mood:  Irritable  Affect:  Congruent  Thought Process:  Goal Directed and Descriptions of Associations: Intact  Orientation:  Full (Time, Place, and Person)  Thought Content:  WDL  Suicidal Thoughts:  No  Homicidal Thoughts:  No  Memory:  Immediate;   Fair Recent;   Fair Remote;   Good  Judgement:  Poor  Insight:  Lacking  Psychomotor Activity:  Normal  Concentration:  Concentration: Fair and Attention Span: Fair  Recall:  [employer] of Knowledge:  Fair  Language:  Fair  Akathisia:  No  Handed:  Right  AIMS (if indicated):     Assets:  Communication Skills Desire for Improvement Physical Health Resilience  ADL's:  Intact  Cognition:  WNL  Sleep:  Number of Hours: 6.5     Treatment Plan Summary: Daily contact with patient to assess and evaluate symptoms and progress in treatment and Medication management   Ms. Ruder is a 52 year old female with a history of depression, mood instability and substance abuse, on suboxone, awaiting rehab. Supportive therapy provided.    Major Depressive Disorder, recurrent, severe; Substance-induced Depression; History of Bipolar Disorder; Anxiety disorder, unspecified. Mood improving slowly.  -Continue seroquel 400 mg  nightly -Continue lamictal 25 mg daily, started 11/17/17 -Elavil 25 mg nightly started 11/23/2017 -QTc 467  Opioid use disorder, severe -suboxone 8-2 mg  BID reduced to once a day on 11/20/17  Cocaine use disorder, severe with associated withdrawal symptoms -monitor BP -hydroxyzine 50 mg TID PRN anxiety  Hypertension, stable -HCTZ and lisinopril -monitor potassium  Chronic pain, neuropathic -gabapentin 300 mg TID  Ear/neck pain -  per ENT, ear abx dropcompleted  Abdominal pain -H/H stable -KUB shows "scattered" stool, no impaction -Hemoccult is positive for blood. GI consulted and pt scheduled for colonoscopy on 7/12. -3 polyps removed, awaiting patholoy -continue miralax 17 g BID -f/u with GI in 4 weeks -repeat colonoscopy in 3 yrs  Disposition: residential substance abuse rehab Tentative discharge: pt can be discharged as soon as accepted to rehab      [name], MD 11/27/2017, 3:25 PM

## 2017-11-27 NOTE — Tx Team (Signed)
Interdisciplinary Treatment and Diagnostic Plan Update  11/27/2017 Time of Session: 11:00 AM Teresa Jefferson MRN: [id number]  Principal Diagnosis: Severe episode of recurrent major depressive disorder, without psychotic features ([place])  Secondary Diagnoses: Principal Problem:   Severe episode of recurrent major depressive disorder, without psychotic features ([place]) Active Problems:   Essential hypertension   Asthma   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[place])   Cocaine use disorder, severe, dependence ([place])   Opioid use disorder, severe, dependence ([place])   Current Medications:  Current Facility-Administered Medications  Medication Dose Route Frequency Provider Last Rate Last Dose  . 0.9 %  sodium chloride infusion   Intravenous Continuous Lin Landsman, MD 20 mL/hr at 11/23/17 1321 1,000 mL at 11/23/17 1321  . 0.9 %  sodium chloride infusion   Intravenous Continuous Vanga, Tally Due, MD      . acetaminophen (TYLENOL) tablet 650 mg  650 mg Oral Q6H PRN [name], [name], MD   650 mg at 11/23/17 0503  . albuterol (PROVENTIL HFA;VENTOLIN HFA) 108 (90 Base) MCG/ACT inhaler 1-2 puff  1-2 puff Inhalation Q6H PRN [name], MD      . alum & mag hydroxide-simeth (MAALOX/MYLANTA) 200-200-20 MG/5ML suspension 30 mL  30 mL Oral Q4H PRN [name], [name], MD      . amitriptyline (ELAVIL) tablet 25 mg  25 mg Oral QHS [name], MD   25 mg at 11/26/17 2135  . buprenorphine-naloxone (SUBOXONE) 8-2 mg per SL tablet 1 tablet  1 tablet Sublingual Daily [name], MD   1 tablet at 11/27/17 0803  . gabapentin (NEURONTIN) capsule 300 mg  300 mg Oral TID PC [name], MD   300 mg at 11/27/17 1416  . hydrochlorothiazide (HYDRODIURIL) tablet 25 mg  25 mg Oral Daily [name], MD   25 mg at 11/27/17 0802  . hydrOXYzine (ATARAX/VISTARIL) tablet 50 mg  50 mg Oral TID PRN [name], [name], MD   50 mg at 11/26/17 2140  . ibuprofen (ADVIL,MOTRIN) tablet 600 mg  600 mg Oral  Q6H PRN [name], MD   600 mg at 11/27/17 0927  . lamoTRIgine (LAMICTAL) tablet 25 mg  25 mg Oral Daily [name], MD   25 mg at 11/27/17 0802  . lisinopril (PRINIVIL,ZESTRIL) tablet 5 mg  5 mg Oral Daily [name], MD   5 mg at 11/27/17 0802  . loratadine (CLARITIN) tablet 10 mg  10 mg Oral Daily Pucilowska, Jolanta B, MD   10 mg at 11/27/17 0802  . magnesium hydroxide (MILK OF MAGNESIA) suspension 30 mL  30 mL Oral Daily PRN [name], [name], MD   30 mL at 11/21/17 2128  . naphazoline-pheniramine (NAPHCON-A) 0.025-0.3 % ophthalmic solution 2 drop  2 drop Both Eyes QID PRN Pucilowska, Jolanta B, MD   2 drop at 11/27/17 0926  . neomycin-bacitracin-polymyxin (NEOSPORIN) ointment   Topical BID [name], MD   1 application at 93/81/01 0802  . ondansetron (ZOFRAN-ODT) disintegrating tablet 4 mg  4 mg Oral Q8H PRN [name], MD   4 mg at 11/23/17 1853  . polyethylene glycol (MIRALAX / GLYCOLAX) packet 17 g  17 g Oral BID [name], MD      . QUEtiapine (SEROQUEL) tablet 400 mg  400 mg Oral QHS [name], [name], MD   400 mg at 11/26/17 2135   PTA Medications: Medications Prior to Admission  Medication Sig Dispense Refill Last Dose  . levothyroxine (SYNTHROID, LEVOTHROID) 125 MCG tablet Take 125 mcg  by mouth daily before breakfast.     . lisinopril-hydrochlorothiazide (PRINZIDE,ZESTORETIC) 20-25 MG tablet Take 1 tablet by mouth daily.   04/28/2017 at Unknown time  . QUEtiapine (SEROQUEL) 400 MG tablet Take 1 tablet (400 mg total) by mouth at bedtime. 7 tablet 0 04/28/2017 at  2200  . traMADol (ULTRAM) 50 MG tablet Take 1 tablet (50 mg total) by mouth every 6 (six) hours as needed. 20 tablet 0     Patient Stressors: Financial difficulties Health problems Legal issue Medication change or noncompliance Occupational concerns Substance abuse  Patient Strengths: Ability for insight Armed forces logistics/support/administrative officer Motivation for treatment/growth  Treatment Modalities: Medication  Management, Group therapy, Case management,  1 to 1 session with clinician, Psychoeducation, Recreational therapy.   Physician Treatment Plan for Primary Diagnosis: Severe episode of recurrent major depressive disorder, without psychotic features ([place]) Long Term Goal(s): Improvement in symptoms so as ready for discharge Improvement in symptoms so as ready for discharge   Short Term Goals: Ability to identify changes in lifestyle to reduce recurrence of condition will improve Ability to verbalize feelings will improve Ability to identify triggers associated with substance abuse/mental health issues will improve Ability to verbalize feelings will improve Ability to disclose and discuss suicidal ideas Ability to identify and develop effective coping behaviors will improve Compliance with prescribed medications will improve Ability to identify triggers associated with substance abuse/mental health issues will improve  Medication Management: Evaluate patient's response, side effects, and tolerance of medication regimen.  Therapeutic Interventions: 1 to 1 sessions, Unit Group sessions and Medication administration.  Evaluation of Outcomes: Progressing  Physician Treatment Plan for Secondary Diagnosis: Principal Problem:   Severe episode of recurrent major depressive disorder, without psychotic features ([place]) Active Problems:   Essential hypertension   Asthma   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[facility])   Cocaine use disorder, severe, dependence ([place])   Opioid use disorder, severe, dependence ([place])  Long Term Goal(s): Improvement in symptoms so as ready for discharge Improvement in symptoms so as ready for discharge   Short Term Goals: Ability to identify changes in lifestyle to reduce recurrence of condition will improve Ability to verbalize feelings will improve Ability to identify triggers associated with substance abuse/mental health issues will improve Ability to  verbalize feelings will improve Ability to disclose and discuss suicidal ideas Ability to identify and develop effective coping behaviors will improve Compliance with prescribed medications will improve Ability to identify triggers associated with substance abuse/mental health issues will improve     Medication Management: Evaluate patient's response, side effects, and tolerance of medication regimen.  Therapeutic Interventions: 1 to 1 sessions, Unit Group sessions and Medication administration.  Evaluation of Outcomes: Progressing   RN Treatment Plan for Primary Diagnosis: Severe episode of recurrent major depressive disorder, without psychotic features ([place]) Long Term Goal(s): Knowledge of disease and therapeutic regimen to maintain health will improve  Short Term Goals: Ability to participate in decision making will improve, Ability to disclose and discuss suicidal ideas and Compliance with prescribed medications will improve  Medication Management: RN will administer medications as ordered by provider, will assess and evaluate patient's response and provide education to patient for prescribed medication. RN will report any adverse and/or side effects to prescribing provider.  Therapeutic Interventions: 1 on 1 counseling sessions, Psychoeducation, Medication administration, Evaluate responses to treatment, Monitor vital signs and CBGs as ordered, Perform/monitor CIWA, COWS, AIMS and Fall Risk screenings as ordered, Perform wound care treatments as ordered.  Evaluation of Outcomes: Progressing   LCSW  Treatment Plan for Primary Diagnosis: Severe episode of recurrent major depressive disorder, without psychotic features ([place]) Long Term Goal(s): Safe transition to appropriate next level of care at discharge, Engage patient in therapeutic group addressing interpersonal concerns.  Short Term Goals: Engage patient in aftercare planning with referrals and resources and Increase skills for  wellness and recovery  Therapeutic Interventions: Assess for all discharge needs, 1 to 1 time with Social worker, Explore available resources and support systems, Assess for adequacy in community support network, Educate family and significant other(s) on suicide prevention, Complete Psychosocial Assessment, Interpersonal group therapy.  Evaluation of Outcomes: Progressing   Progress in Treatment: Attending groups: Yes. Participating in groups: No. Taking medication as prescribed: Yes. Toleration medication: Yes. Family/Significant other contact made: No, will contact:  Pt refused to have family support contacted. Patient understands diagnosis: Yes. Discussing patient identified problems/goals with staff: Yes. Medical problems stabilized or resolved: Yes. Denies suicidal/homicidal ideation: Yes. Issues/concerns per patient self-inventory: No. Other: n/a  New problem(s) identified: No, Describe:  No new problems identified  New Short Term/Long Term Goal(s):  Patient Goals: "To get clean so I can get back in with my family and grandkids;  Get my mind where I can make better decisions"   Discharge Plan or Barriers: Tentative discharge plan is for pt to go to an inpatient rehab facility.  Pt would like to be referred to an inpatient rehab provider that will continue with her Suboxone treatment.  Reason for Continuation of Hospitalization: Anxiety Depression Medication stabilization   Estimated Length of Stay: 3-5 days  Recreational Therapy: Patient Stressors: Homeless, Relationship  Patient Goal: Patient will identify 3 positive coping skills strategies to use post d/c within 5 recreation therapy group sessions  Attendees: Patient:  11/27/2017 4:38 PM  Physician: Andrena Mews, MD 11/27/2017 4:38 PM  Nursing: Polly Cobia, RN 11/27/2017 4:38 PM  RN Care Manager: 11/27/2017 4:38 PM  Social Worker: Dossie Arbour, LCSW 11/27/2017 4:38 PM  Recreational Therapist: Roanna Epley, LRT  11/27/2017 4:38 PM  Other: Alden Hipp, [place] 11/27/2017 4:38 PM  Other:  11/27/2017 4:38 PM  Other: 11/27/2017 4:38 PM    Scribe for Treatment Team: August Saucer, LCSW 11/27/2017 4:38 PM

## 2017-11-27 NOTE — Plan of Care (Signed)
Pt continues to progress towards goals and d/c. RN will continue to monitor.  

## 2017-11-28 DIAGNOSIS — F3289 Other specified depressive episodes: Secondary | ICD-10-CM

## 2017-11-28 DIAGNOSIS — F1120 Opioid dependence, uncomplicated: Secondary | ICD-10-CM

## 2017-11-28 DIAGNOSIS — F1420 Cocaine dependence, uncomplicated: Secondary | ICD-10-CM

## 2017-11-28 DIAGNOSIS — F1994 Other psychoactive substance use, unspecified with psychoactive substance-induced mood disorder: Secondary | ICD-10-CM

## 2017-11-28 LAB — CBC WITH DIFFERENTIAL/PLATELET
Basophils Absolute: 0.1 10*3/uL (ref 0–0.1)
Basophils Relative: 1 %
EOS ABS: 0.1 10*3/uL (ref 0–0.7)
EOS PCT: 3 %
HCT: 38.5 % (ref 35.0–47.0)
Hemoglobin: 12.9 g/dL (ref 12.0–16.0)
LYMPHS ABS: 1.3 10*3/uL (ref 1.0–3.6)
Lymphocytes Relative: 27 %
MCH: 27.9 pg (ref 26.0–34.0)
MCHC: 33.5 g/dL (ref 32.0–36.0)
MCV: 83.3 fL (ref 80.0–100.0)
Monocytes Absolute: 0.4 10*3/uL (ref 0.2–0.9)
Monocytes Relative: 8 %
Neutro Abs: 3.1 10*3/uL (ref 1.4–6.5)
Neutrophils Relative %: 61 %
Platelets: 218 10*3/uL (ref 150–440)
RBC: 4.62 MIL/uL (ref 3.80–5.20)
RDW: 14.3 % (ref 11.5–14.5)
WBC: 5 10*3/uL (ref 3.6–11.0)

## 2017-11-28 LAB — HEPATIC FUNCTION PANEL
ALBUMIN: 4 g/dL (ref 3.5–5.0)
ALK PHOS: 67 U/L (ref 38–126)
ALT: 30 U/L (ref 0–44)
AST: 31 U/L (ref 15–41)
BILIRUBIN TOTAL: 0.4 mg/dL (ref 0.3–1.2)
Bilirubin, Direct: <0.1 mg/dL (ref 0.0–0.2)
Total Protein: 7.4 g/dL (ref 6.5–8.1)

## 2017-11-28 LAB — SURGICAL PATHOLOGY

## 2017-11-28 MED ORDER — GABAPENTIN 400 MG PO CAPS
400.0000 mg | ORAL_CAPSULE | Freq: Three times a day (TID) | ORAL | Status: DC
  Administered 2017-11-28 – 2017-12-06 (×24): 400 mg via ORAL
  Filled 2017-11-28 (×24): qty 1

## 2017-11-28 MED ORDER — DIPHENHYDRAMINE HCL 25 MG PO CAPS
50.0000 mg | ORAL_CAPSULE | ORAL | Status: DC | PRN
  Administered 2017-11-28 – 2017-12-05 (×8): 50 mg via ORAL
  Filled 2017-11-28 (×8): qty 2

## 2017-11-28 MED ORDER — LAMOTRIGINE 25 MG PO TABS: 25.0000 mg | ORAL_TABLET | Freq: Every day | ORAL | Status: DC

## 2017-11-28 NOTE — Progress Notes (Addendum)
[facility] MD Progress Note  11/28/2017 1:18 PM Teresa Jefferson  MRN:  [id number]  Subjective:   Patient reports good mood.  She is motivated to get into a substance rehab facility.  She has been making calls to boarding houses as well.  She reports excitement that she has found a place to live after she gets out of inpatient rehab.  Patient will likely be accepted to [facility], but the facility needs confirmation that she will follow up with an outpatient clinic that can continue the suboxone pharmacotherapy.  Patient and I called an outpatient clinic ([facility]) in [place] today that does daily dosing of suboxone at a rate of $24/day.  Prior to getting out of the inpatient rehab, pt would have to call [facility] to set up a screening over the phone.  Then patient can be seen the next day at the clinic for the initial intake.  Typically, the patient can be provided with same day dosing on the day of the initial intake.    Additionally, pt states she had a bowel movement.   Patient also complains of a red, raised, itchy rash on upper chest/lower neck and a few areas on bilateral upper and lower extremities.  Also noted to have an area of excoriation vs area where she states she popped a blister in her right ear.  Patient denies any intra-oral lesions, but feels like her face is itchy.  Of note, pt has been on Lamictal 25 mg since November 17, 2017.  She states the rash appeared 2 days ago, but she didn't notify the doctor.   We discussed that when she first was started on lamotrigine that one of the risks is rash, including the possibility of Teresa Jefferson's syndrome/rash, which is serious and could be fatal.  Pt voiced understanding at that time and wanted to start lamotrigine b/c she had been on it in the past with good efficacy.  Patient consents to consult dermatology to take a look at the rash.    Of note, pt states she ate fish yesterday and today and she felt like the  rash worsened after eating the fish.     Principal Problem: Severe episode of recurrent major depressive disorder, without psychotic features ([place]) Diagnosis:   Patient Active Problem List   Diagnosis Date Noted  . Essential hypertension [I10] 11/17/2017  . Asthma [J45.909] 11/17/2017  . 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[place]) [V25.36, F32.89] 11/17/2017  . Cocaine use disorder, severe, dependence ([place]) [F14.20] 11/17/2017  . Opioid use disorder, severe, dependence ([place]) [F11.20] 11/17/2017  . Severe episode of recurrent major depressive disorder, without psychotic features ([place]) [F33.2] 11/16/2017  . Opiate abuse, continuous ([place]) [F11.10] 11/16/2017  . Cocaine abuse ([place]) [F14.10] 11/16/2017  . Suicidal ideation [R45.851] 11/16/2017  . Chronic pain [G89.29] 11/16/2017   Total Time spent with patient, discussing pt with treatment team, reviewing chart, and providing supportive therapy.: 1 hour  Past Psychiatric History: depression, substance abuse  Past Medical History:  Past Medical History:  Diagnosis Date  . Bipolar 1 disorder ([place])   . Depression   . Hypertension   . Neuropathy   . Schizophrenia ([place])   . Thyroid disease     Past Surgical History:  Procedure Laterality Date  . ABDOMINAL HYSTERECTOMY    . COLONOSCOPY N/A 11/23/2017   Procedure: COLONOSCOPY;  Surgeon: Lin Landsman, MD;  Location: [facility] ENDOSCOPY;  Service: Gastroenterology;  Laterality: N/A;  . COLONOSCOPY N/A 11/24/2017  Procedure: COLONOSCOPY;  Surgeon: Lin Landsman, MD;  Location[facility]OSCOPY;  Service: Gastroenterology;  Laterality: N/A;  . KNEE SURGERY Right   . TONSILLECTOMY     Family History: History reviewed. No pertinent family history. Family Psychiatric  History: see H&P Social History:  Social History   Substance and Sexual Activity  Alcohol Use No   Comment: occ     Social History   Substance and Sexual Activity  Drug Use Yes   Comment: crack and  opiates    Social History   Socioeconomic History  . Marital status: Legally Separated    Spouse name: Not on file  . Number of children: Not on file  . Years of education: Not on file  . Highest education level: Not on file  Occupational History  . Not on file  Social Needs  . Financial resource strain: Not on file  . Food insecurity:    Worry: Not on file    Inability: Not on file  . Transportation needs:    Medical: Not on file    Non-medical: Not on file  Tobacco Use  . Smoking status: Never Smoker  . Smokeless tobacco: Never Used  Substance and Sexual Activity  . Alcohol use: No    Comment: occ  . Drug use: Yes    Comment: crack and opiates  . Sexual activity: Not on file  Lifestyle  . Physical activity:    Days per week: Not on file    Minutes per session: Not on file  . Stress: Not on file  Relationships  . Social connections:    Talks on phone: Not on file    Gets together: Not on file    Attends religious service: Not on file    Active member of club or organization: Not on file    Attends meetings of clubs or organizations: Not on file    Relationship status: Not on file  Other Topics Concern  . Not on file  Social History Narrative  . Not on file   Additional Social History: see H&P  Sleep: Fair  Appetite:  Fair  Current Medications: Current Facility-Administered Medications  Medication Dose Route Frequency Provider Last Rate Last Dose  . 0.9 %  sodium chloride infusion   Intravenous Continuous Lin Landsman, MD 20 mL/hr at 11/23/17 1321 1,000 mL at 11/23/17 1321  . 0.9 %  sodium chloride infusion   Intravenous Continuous Vanga, Tally Due, MD      . acetaminophen (TYLENOL) tablet 650 mg  650 mg Oral Q6H PRN [name], [name], MD   650 mg at 11/23/17 0503  . albuterol (PROVENTIL HFA;VENTOLIN HFA) 108 (90 Base) MCG/ACT inhaler 1-2 puff  1-2 puff Inhalation Q6H PRN [name], MD      . alum & mag hydroxide-simeth (MAALOX/MYLANTA)  200-200-20 MG/5ML suspension 30 mL  30 mL Oral Q4H PRN [name], [name], MD      . amitriptyline (ELAVIL) tablet 25 mg  25 mg Oral QHS [name], MD   25 mg at 11/27/17 2120  . buprenorphine-naloxone (SUBOXONE) 8-2 mg per SL tablet 1 tablet  1 tablet Sublingual Daily [name], MD   1 tablet at 11/28/17 0826  . diphenhydrAMINE (BENADRYL) capsule 50 mg  50 mg Oral Q4H PRN [name], MD      . gabapentin (NEURONTIN) capsule 400 mg  400 mg Oral TID PC O'Neal, , MD      . hydrochlorothiazide (HYDRODIURIL) tablet 25 mg  25  mg Oral Daily [name], MD   25 mg at 11/28/17 0826  . ibuprofen (ADVIL,MOTRIN) tablet 600 mg  600 mg Oral Q6H PRN [name], MD   600 mg at 11/28/17 1207  . lisinopril (PRINIVIL,ZESTRIL) tablet 5 mg  5 mg Oral Daily [name], MD   5 mg at 11/28/17 0827  . loratadine (CLARITIN) tablet 10 mg  10 mg Oral Daily Pucilowska, Jolanta B, MD   10 mg at 11/28/17 0826  . magnesium hydroxide (MILK OF MAGNESIA) suspension 30 mL  30 mL Oral Daily PRN [name], [name], MD   30 mL at 11/21/17 2128  . naphazoline-pheniramine (NAPHCON-A) 0.025-0.3 % ophthalmic solution 2 drop  2 drop Both Eyes QID PRN Pucilowska, Jolanta B, MD   2 drop at 11/28/17 1207  . neomycin-bacitracin-polymyxin (NEOSPORIN) ointment   Topical BID [name], MD   1 application at 66/06/30 0802  . ondansetron (ZOFRAN-ODT) disintegrating tablet 4 mg  4 mg Oral Q8H PRN [name], MD   4 mg at 11/23/17 1853  . polyethylene glycol (MIRALAX / GLYCOLAX) packet 17 g  17 g Oral BID [name], MD   17 g at 11/28/17 0826  . QUEtiapine (SEROQUEL) tablet 400 mg  400 mg Oral QHS [name], [name], MD   400 mg at 11/27/17 2120    Lab Results:  Results for orders placed or performed during the hospital encounter of 11/16/17 (from the past 48 hour(s))  CBC with Differential/Platelet     Status: None   Collection Time: 11/28/17 12:17 PM  Result Value Ref Range   WBC 5.0 3.6 - 11.0 K/uL   RBC 4.62  3.80 - 5.20 MIL/uL   Hemoglobin 12.9 12.0 - 16.0 g/dL   HCT 38.5 35.0 - 47.0 %   MCV 83.3 80.0 - 100.0 fL   MCH 27.9 26.0 - 34.0 pg   MCHC 33.5 32.0 - 36.0 g/dL   RDW 14.3 11.5 - 14.5 %   Platelets 218 150 - 440 K/uL   Neutrophils Relative % 61 %   Neutro Abs 3.1 1.4 - 6.5 K/uL   Lymphocytes Relative 27 %   Lymphs Abs 1.3 1.0 - 3.6 K/uL   Monocytes Relative 8 %   Monocytes Absolute 0.4 0.2 - 0.9 K/uL   Eosinophils Relative 3 %   Eosinophils Absolute 0.1 0 - 0.7 K/uL   Basophils Relative 1 %   Basophils Absolute 0.1 0 - 0.1 K/uL    Comment: Performed at [facility], [address] [place], [place] [address]    Blood Alcohol level:  Lab Results  Component Value Date   [facility] <10 11/16/2017   ETH <10 93/23/5573    Metabolic Disorder Labs: Lab Results  Component Value Date   HGBA1C 6.1 (H) 11/16/2017   MPG 128.37 11/16/2017   No results found for: PROLACTIN Lab Results  Component Value Date   CHOL 164 11/16/2017   TRIG 117 11/16/2017   HDL 60 11/16/2017   CHOLHDL 2.7 11/16/2017   VLDL 23 11/16/2017   LDLCALC 81 11/16/2017    Physical Findings: AIMS: Facial and Oral Movements Muscles of Facial Expression: None, normal Lips and Perioral Area: None, normal Jaw: None, normal Tongue: None, normal,Extremity Movements Upper (arms, wrists, hands, fingers): None, normal Lower (legs, knees, ankles, toes): None, normal,  , Overall Severity Severity of abnormal movements (highest score from questions above): None, normal Incapacitation due to abnormal movements: None, normal Patient's awareness of abnormal movements (rate only patient's report):  No Awareness, Dental Status Current problems with teeth and/or dentures?: No Does patient usually wear dentures?: No  CIWA:    COWS:     Musculoskeletal: Strength & Muscle Tone: within normal limits Gait & Station: normal Patient leans: N/A  Psychiatric Specialty Exam: Physical Exam  Nursing note and vitals  reviewed. Constitutional: She appears well-developed and well-nourished.  HENT:  Head: Normocephalic.  Skin: Rash noted. Rash is maculopapular (red raised rash on lower neck, upper chest; a few small red bumps bilateral upper and lower extremities; lesion in righ ear-pt states it was a blister but she popped it). She is not diaphoretic. No pallor.  Psychiatric: Her speech is normal. Judgment and thought content normal. Her mood appears not anxious. Her affect is not angry. She is not agitated. Cognition and memory are normal. She does not exhibit a depressed mood.    Review of Systems  Constitutional: Negative for chills and fever.  Eyes: Positive for blurred vision.  Gastrointestinal: Negative for constipation.  Skin: Positive for itching and rash.  Neurological: Negative.   Psychiatric/Behavioral: Negative for depression.  All other systems reviewed and are negative.   Blood pressure 103/82, pulse 81, temperature 98.1 F (36.7 C), temperature source Oral, resp. rate 18, height 5\' 2"  (1.575 m), weight 78 kg (172 lb), SpO2 96 %.Body mass index is 31.46 kg/m.  General Appearance: Casual  Eye Contact:  Good  Speech:  Normal Rate  Volume:  Increased  Mood:  Euthymic  Affect:  Congruent  Thought Process:  Goal Directed and Descriptions of Associations: Intact  Orientation:  Full (Time, Place, and Person)  Thought Content:  WDL  Suicidal Thoughts:  No  Homicidal Thoughts:  No  Memory:  Immediate;   Fair Recent;   Fair Remote;   Good  Judgement:  Poor  Insight:  Lacking  Psychomotor Activity:  Normal  Concentration:  Concentration: Fair and Attention Span: Fair  Recall:  [employer] of Knowledge:  Fair  Language:  Fair  Akathisia:  No  Handed:  Right  AIMS (if indicated):     Assets:  Communication Skills Desire for Improvement Physical Health Resilience  ADL's:  Intact  Cognition:  WNL  Sleep:  Number of Hours: 6.3     Treatment Plan Summary: Daily contact with patient  to assess and evaluate symptoms and progress in treatment and Medication management   Ms. Bradt is a 52 year old female with a history of depression, mood instability and substance abuse, on suboxone, awaiting rehab. Supportive therapy provided.   Pt has rash on upper chest/lower neck, and a few spots on upper and lower bilateral extremities.  Lamotrigine discontinued and dermatology consulted.   Pt states she had a similar rash with the effexor XR in the past.     Major Depressive Disorder, recurrent, severe; Substance-induced Depression; History of Bipolar Disorder; Anxiety disorder, unspecified. Mood improving slowly.  -Continue seroquel 400 mg nightly -Discontinue lamotrigine on 7/16, (lamotrigine 25 mg qd was started 11/17/17), but pt notified M.D. of rash on 7/16 -Elavil 25 mg nightly started 11/23/2017 -QTc 467  Rash -stopped lamotrigine  -consulted Dermatology -Benadryl 50 mg q 4 hrs prn itch -per dermatologist, hepatic panel and CBC with diff ordered  Opioid use disorder, severe -suboxone 8-2 mg BID reduced to once a day on 11/20/17  Cocaine use disorder, severe with associated withdrawal symptoms -monitor BP -hydroxyzine 50 mg TID PRN anxiety  Hypertension, stable -HCTZ and lisinopril -monitor potassium  Chronic pain, neuropathic --Gabapentin increased from 300  mg to 400 mg TID for neuropathic pain in back and bilateral upper and lower extremities  Ear/neck pain -  per ENT, ear abx dropcompleted  Abdominal pain -H/H stable -KUB shows "scattered" stool, no impaction -Hemoccult is positive for blood. GI consulted and pt scheduled for colonoscopy on 7/12. -3 polyps removed, awaiting patholoy -continue miralax 17 g BID -f/u with GI in 4 weeks -repeat colonoscopy in 3 yrs  Disposition: residential substance abuse rehab Tentative discharge: pt can be discharged as soon as accepted to rehab  Risks (including but not limited rash, allergic reaction,  death, Steven-Jefferson's syndrome) and benefits discussed with the patient, and pt voices understanding and consents to the treatment plan.        [name], MD 11/28/2017, 1:18 PM

## 2017-11-28 NOTE — Plan of Care (Signed)
Patient is improving in all areas and doing well in the unit , patient is anticipating to be discharged, contract for safety of self and others , 15 minute rounding is in progress patient is sleeping long hours with out any interruptions, denies any SI/HI/AVH. Problem: Education: Goal: Knowledge of [facility] [place] [facility] Education information/materials will improve Outcome: Progressing Goal: Emotional status will improve Outcome: Progressing Goal: Mental status will improve Outcome: Progressing Goal: Verbalization of understanding the information provided will improve Outcome: Progressing   Problem: Safety: Goal: Periods of time without injury will increase Outcome: Progressing   Problem: Safety: Goal: Ability to disclose and discuss suicidal ideas will improve Outcome: Progressing   Problem: Safety: Goal: Ability to remain free from injury will improve Outcome: Progressing

## 2017-11-28 NOTE — Consult Note (Signed)
CC: Teresa Jefferson is a 52 yo female seen in consultation at the request of Dr. Audie Box for evaluation of a rash.  HPI: The patient reports a two-day history of a mild rash involving the right ear and upper chest. She started Lamictal on 11/17/2017. She denies rash elsewhere. No treatment started yet. Labs ordered earlier today for potential DRESS ([facility]), including CBC and LFTS, were unremarkable.  ROS: Patient denies lymphadenopathy, oral or genital erosions or ulcers. No fever. No history of immunosuppression.  Physical Examination:  Vitals: taken at 1pm today were normal  General: Well developed. Well nourished. Alert. Oriented.  Lymphatics: No cervical lymphadenopathy.  Skin: Examination of the face, eyelids, lips, neck, chest, back, abdomen, R upper extremity, L upper extremity, R lower extremity, and L lower extremity was performed at remarkable for:  --10-15 abraded erythematous papules with overlying hemorrhagic crust over the right ear and upper chest  --No intact vesicles or bullae  Assessment and Plan:  Excoriated papules most consistent with arthropod bites.  The appearance of the rash is nonspecific. While Lamictal is a common cause of drug eruptions (including more severe eruptions like DRESS or SJS), the patient's exam is not consistent with a drug eruption currently. Furthermore, the labs obtained this afternoon are reassuring. If Lamictal gives the patient the best chance for clinical improvement, it would be reasonable to continue with this medication. However, if there is an equivalent alternative, one could consider switching out of an abundance of caution. This is likely unnecessary though.  Triamcinolone 0.1% ointment may be applied to affected areas twice daily until improved to treat any underlying inflammation.   Please call if the rash worsens significantly, if intact vesicles/bullae develop, or if mucosal lesions arise.  Thank you for the consult.  [facility]

## 2017-11-28 NOTE — Progress Notes (Signed)
Recreation Therapy Notes  Date: 11/28/2017  Time: 9:30 am  Location: Craft Room  Behavioral response: Appropriate    Intervention Topic: Relaxation  Discussion/Intervention:  Group content today was focused on relaxation. The group defined relaxation and identified healthy ways to relax. Individuals expressed how much time they spend relaxing. Patients expressed how much their life would be if they did not make time for themselves to relax. The group stated ways they could improve their relaxation techniques in the future.  Individuals participated in the intervention "Time to Relax" where they had a chance to experience different relaxation techniques.   Clinical Observations/Feedback:  Patient came to group and stated she does not have much time to relax because she can not find the time. She identified coloring as a form of relaxation for her. Individual participated in the intervention and was social with peers and staff during group.   LRT/CTRS            11/28/2017 10:52 AM

## 2017-11-28 NOTE — Plan of Care (Signed)
Improving with emotional and mental status . Voice of no safety concerns . Denies suicidal ideations. Attending unit programing . Interacting  with  peers and staff Patient understanding of information received from staff     Problem: Education: Goal: Knowledge of [place] Education information/materials will improve Outcome: Progressing Goal: Emotional status will improve Outcome: Progressing Goal: Mental status will improve Outcome: Progressing Goal: Verbalization of understanding the information provided will improve Outcome: Progressing   Problem: Safety: Goal: Periods of time without injury will increase Outcome: Progressing   Problem: Safety: Goal: Ability to disclose and discuss suicidal ideas will improve Outcome: Progressing   Problem: Safety: Goal: Ability to remain free from injury will improve Outcome: Progressing

## 2017-11-28 NOTE — Plan of Care (Addendum)
Patient found in common area upon my arrival. Patient is visible and social this evening. Patient mood and affect are bright and cheerful. Denies SI/HI/AVH. Processing is intact. Patient is energetic and appropriate. Complains of itching, given Benadryl. Complains of irritation in eyes, given ggts. Complains of back pain, given Motrin with Neurontin. All PRNs given with positive results. Reports eating adequately. Reports some difficulty voiding stool. Remains on Miralax BID. Compliant with HS medications and staff direction. Q 15 minute checks maintained. Will continue to monitor throughout the shift. Patient slept 7 hours. No apparent distress. Will endorse care to oncoming shift.  Problem: Education: Goal: Knowledge of [facility] [facility] Education information/materials will improve Outcome: Progressing Goal: Emotional status will improve Outcome: Progressing Goal: Mental status will improve Outcome: Progressing Goal: Verbalization of understanding the information provided will improve Outcome: Progressing   Problem: Safety: Goal: Periods of time without injury will increase Outcome: Progressing   Problem: Safety: Goal: Ability to disclose and discuss suicidal ideas will improve Outcome: Progressing   Problem: Safety: Goal: Ability to remain free from injury will improve Outcome: Progressing

## 2017-11-28 NOTE — [facility] Group Notes (Signed)
11/28/2017 1PM  Type of Therapy/Topic:  Group Therapy:  Feelings about Diagnosis  Participation Level:  Active   Description of Group:   This group will allow patients to explore their thoughts and feelings about diagnoses they have received. Patients will be guided to explore their level of understanding and acceptance of these diagnoses. Facilitator will encourage patients to process their thoughts and feelings about the reactions of others to their diagnosis and will guide patients in identifying ways to discuss their diagnosis with significant others in their lives. This group will be process-oriented, with patients participating in exploration of their own experiences, giving and receiving support, and processing challenge from other group members.   Therapeutic Goals: 1. Patient will demonstrate understanding of diagnosis as evidenced by identifying two or more symptoms of the disorder 2. Patient will be able to express two feelings regarding the diagnosis 3. Patient will demonstrate their ability to communicate their needs through discussion and/or role play  Summary of Patient Progress: Actively and appropriately engaged in the group. Patient was able to provide support and validation to other group members.Patient practiced active listening when interacting with the facilitator and other group members. Teresa Jefferson spoke about the barriers that she has came across with obtaining treatment for her diagnosis. She reports having symptoms of being upset and having mood swings. Pt. Reports "I will not let these little things get in the way of my treatment and recovery." Patient is still in the process of obtaining treatment goals.        Therapeutic Modalities:   Cognitive Behavioral Therapy Brief Therapy Feelings Identification    Darin Engels, Marlinda Mike 11/28/2017 2:39 PM

## 2017-11-28 NOTE — Progress Notes (Signed)
D: Patient stated slept good last night .Stated appetite is good and energy level  Is normal. Stated concentration is good . Stated on Depression scale  4, hopeless 6 and anxiety 6 .( low 0-10 high) Denies suicidal  homicidal ideations  .  No auditory hallucinations  Some pain concerns . Appropriate ADL'S. Interacting with peers and staff. Improving with emotional and mental status . Voice of no safety concerns . Denies suicidal ideations. Attending unit programing . Interacting with peers and staff Patient understanding of information received from staff  Voice of being  Able to secure  A home . Stated she is able  A: Encourage patient participation with unit programming . Instruction  Given on  Medication , verbalize understanding.  R: Voice no other concerns. Staff continue to monitor

## 2017-11-28 NOTE — [facility] Group Notes (Signed)
CSW Group Therapy Note  11/28/2017  Time:  0900  Type of Therapy and Topic: Group Therapy: Goals Group: SMART Goals    Participation Level:  Active    Description of Group:   The purpose of a daily goals group is to assist and guide patients in setting recovery/wellness-related goals. The objective is to set goals as they relate to the crisis in which they were admitted. Patients will be using SMART goal modalities to set measurable goals. Characteristics of realistic goals will be discussed and patients will be assisted in setting and processing how one will reach their goal. Facilitator will also assist patients in applying interventions and coping skills learned in psycho-education groups to the SMART goal and process how one will achieve defined goal.    Therapeutic Goals:  -Patients will develop and document one goal related to or their crisis in which brought them into treatment.  -Patients will be guided by LCSW using SMART goal setting modality in how to set a measurable, attainable, realistic and time sensitive goal.  -Patients will process barriers in reaching goal.  -Patients will process interventions in how to overcome and successful in reaching goal.    Patient's Goal:  Pt continues to work towards their tx goals but has not yet reached them. Pt was able to appropriately participate in group discussion, and was able to offer support/validation to other group members. Pt reported her goal for today is, "to call at least 4-5 places to see if I can go there when I'm discharged."  Therapeutic Modalities:  Motivational Interviewing  Cognitive Behavioral Therapy  Crisis Intervention Model  SMART goals setting  Alden Hipp, MSW, LCSW Clinical Social Worker 11/28/2017 9:29 AM

## 2017-11-29 ENCOUNTER — Encounter: Payer: Self-pay | Admitting: Gastroenterology

## 2017-11-29 DIAGNOSIS — F3289 Other specified depressive episodes: Secondary | ICD-10-CM

## 2017-11-29 DIAGNOSIS — F1994 Other psychoactive substance use, unspecified with psychoactive substance-induced mood disorder: Secondary | ICD-10-CM

## 2017-11-29 DIAGNOSIS — F1420 Cocaine dependence, uncomplicated: Secondary | ICD-10-CM

## 2017-11-29 DIAGNOSIS — F1120 Opioid dependence, uncomplicated: Secondary | ICD-10-CM

## 2017-11-29 MED ORDER — TRIAMCINOLONE ACETONIDE 0.1 % EX OINT
TOPICAL_OINTMENT | Freq: Two times a day (BID) | CUTANEOUS | Status: DC
  Administered 2017-11-29 – 2017-11-30 (×2): via TOPICAL
  Filled 2017-11-29: qty 15

## 2017-11-29 MED ORDER — HYDROXYZINE HCL 50 MG PO TABS
50.0000 mg | ORAL_TABLET | Freq: Three times a day (TID) | ORAL | Status: DC | PRN
  Administered 2017-11-29 – 2017-12-05 (×4): 50 mg via ORAL
  Filled 2017-11-29 (×4): qty 1

## 2017-11-29 NOTE — Progress Notes (Signed)
D: Pt denies SI/HI/AVH. Pt is pleasant and cooperative during assessments, but later in the evening reports increased anxieties from, "being here and dealing with housing plans". PRN medications given for comfort with good results. Patient Interaction appropriate and engaging during assessments. Pt. Reports BM yesterday 11/28/17.   A: Q x 15 minute observation checks were completed for safety. Patient was provided with education.  Patient was given scheduled/PRN medications. Patient  was encourage to attend groups, participate in unit activities and continue with plan of care. Pt. Chart and plans of care reviewed. Pt. Given support and encouragement.   R: Patient is complaint with medication and unit procedures. Pt. Attends groups and unit activities.            Precautionary checks every 15 minutes for safety maintained, room free of safety hazards, patient sustains no injury or falls during this shift.

## 2017-11-29 NOTE — Progress Notes (Signed)
[facility] MD Progress Note  11/29/2017 4:38 PM Teresa Jefferson  MRN:  [id number]  Subjective:   Patient reports being anxious and depressed due to waiting to hear back whether she got into the inpatient substance abuse rehab facility.  I did speak to the staff at [facility] who will be reviewing her referral.  They now understand that the patient was started on Suboxone at our facility and does not currently have an outpatient provider. Patient reports that her appetite and sleep are good.  Patient is medication compliant.  Patient has found a boarding house where she will be able to rent a room for $400 a month.  However the room will not be available until August 1.  Patient is hopeful to be accepted to inpatient rehab in the interim.  However if she is not accepted, we have discussed an alternate plan including [facility].  Patient states that she will be unable to stay with her daughter, so she is not sure where she would go if discharged prior to Aug 1.  It is a realistic possibility that she may have to go to a homeless shelter.    Principal Problem: Severe episode of recurrent major depressive disorder, without psychotic features ([place]) Diagnosis:   Patient Active Problem List   Diagnosis Date Noted  . Essential hypertension [I10] 11/17/2017  . Asthma [J45.909] 11/17/2017  . 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 Cocaine use disorder, severe, dependence ([place]) [F14.20] 11/17/2017  . Opioid use disorder, severe, dependence ([place]) [F11.20] 11/17/2017  . Severe episode of recurrent major depressive disorder, without psychotic features ([place]) [F33.2] 11/16/2017  . Opiate abuse, continuous ([place]) [F11.10] 11/16/2017  . Cocaine abuse ([place]) [F14.10] 11/16/2017  . Suicidal ideation [R45.851] 11/16/2017  . Chronic pain [G89.29] 11/16/2017   Total Time spent with patient, discussing pt with treatment team, reviewing chart, and providing supportive therapy.: 35  min  Past Psychiatric History: depression, substance abuse  Past Medical History:  Past Medical History:  Diagnosis Date  . Bipolar 1 disorder ([place])   . Depression   . Hypertension   . Neuropathy   . Schizophrenia ([place])   . Thyroid disease     Past Surgical History:  Procedure Laterality Date  . ABDOMINAL HYSTERECTOMY    . COLONOSCOPY N/A 11/23/2017   Procedure: COLONOSCOPY;  Surgeon: Lin Landsman, MD;  Location: [facility] ENDOSCOPY;  Service: Gastroenterology;  Laterality: N/A;  . COLONOSCOPY N/A 11/24/2017   Procedure: COLONOSCOPY;  Surgeon: Lin Landsman, MD;  Location: [facility] ENDOSCOPY;  Service: Gastroenterology;  Laterality: N/A;  . KNEE SURGERY Right   . TONSILLECTOMY     Family History: History reviewed. No pertinent family history. Family Psychiatric  History: see H&P Social History:  Social History   Substance and Sexual Activity  Alcohol Use No   Comment: occ     Social History   Substance and Sexual Activity  Drug Use Yes   Comment: crack and opiates    Social History   Socioeconomic History  . Marital status: Legally Separated    Spouse name: Not on file  . Number of children: Not on file  . Years of education: Not on file  . Highest education level: Not on file  Occupational History  . Not on file  Social Needs  . Financial resource strain: Not on file  . Food insecurity:    Worry: Not on file    Inability: Not on file  . Transportation needs:  Medical: Not on file    Non-medical: Not on file  Tobacco Use  . Smoking status: Never Smoker  . Smokeless tobacco: Never Used  Substance and Sexual Activity  . Alcohol use: No    Comment: occ  . Drug use: Yes    Comment: crack and opiates  . Sexual activity: Not on file  Lifestyle  . Physical activity:    Days per week: Not on file    Minutes per session: Not on file  . Stress: Not on file  Relationships  . Social connections:    Talks on phone: Not on file    Gets together: Not on  file    Attends religious service: Not on file    Active member of club or organization: Not on file    Attends meetings of clubs or organizations: Not on file    Relationship status: Not on file  Other Topics Concern  . Not on file  Social History Narrative  . Not on file   Additional Social History: see H&P  Sleep: Good  Appetite:  Good  Current Medications: Current Facility-Administered Medications  Medication Dose Route Frequency Provider Last Rate Last Dose  . 0.9 %  sodium chloride infusion   Intravenous Continuous Lin Landsman, MD 20 mL/hr at 11/23/17 1321 1,000 mL at 11/23/17 1321  . 0.9 %  sodium chloride infusion   Intravenous Continuous [place]anga, Tally Due, MD      . acetaminophen (TYLENOL) tablet 650 mg  650 mg Oral Q6H PRN [name], [name], MD   650 mg at 11/23/17 0503  . albuterol (PROVENTIL HFA;VENTOLIN HFA) 108 (90 Base) MCG/ACT inhaler 1-2 puff  1-2 puff Inhalation Q6H PRN [name], MD      . alum & mag hydroxide-simeth (MAALOX/MYLANTA) 200-200-20 MG/5ML suspension 30 mL  30 mL Oral Q4H PRN [name], [name], MD      . amitriptyline (ELAVIL) tablet 25 mg  25 mg Oral QHS [name], MD   25 mg at 11/28/17 2110  . buprenorphine-naloxone (SUBOXONE) 8-2 mg per SL tablet 1 tablet  1 tablet Sublingual Daily [name], MD   1 tablet at 11/29/17 1884  . diphenhydrAMINE (BENADRYL) capsule 50 mg  50 mg Oral Q4H PRN [name], MD   50 mg at 11/28/17 2149  . gabapentin (NEURONTIN) capsule 400 mg  400 mg Oral TID PC O'Neal, Fredirick Maudlin, MD   400 mg at 11/29/17 1413  . hydrochlorothiazide (HYDRODIURIL) tablet 25 mg  25 mg Oral Daily [name], MD   25 mg at 11/29/17 1660  . hydrOXYzine (ATARAX/VISTARIL) tablet 50 mg  50 mg Oral TID PRN [name], MD   50 mg at 11/29/17 1426  . ibuprofen (ADVIL,MOTRIN) tablet 600 mg  600 mg Oral Q6H PRN [name], MD   600 mg at 11/29/17 0828  . lisinopril (PRINIVIL,ZESTRIL) tablet 5 mg  5 mg Oral Daily [name], MD    5 mg at 11/29/17 0821  . loratadine (CLARITIN) tablet 10 mg  10 mg Oral Daily Pucilowska, Jolanta B, MD   10 mg at 11/29/17 6301  . magnesium hydroxide (MILK OF MAGNESIA) suspension 30 mL  30 mL Oral Daily PRN [name], [name], MD   30 mL at 11/21/17 2128  . naphazoline-pheniramine (NAPHCON-A) 0.025-0.3 % ophthalmic solution 2 drop  2 drop Both Eyes QID PRN Pucilowska, Jolanta B, MD   2 drop at 11/29/17 0828  . neomycin-bacitracin-polymyxin (NEOSPORIN) ointment   Topical BID [name], MD  1 application at 13/24/40 1635  . ondansetron (ZOFRAN-ODT) disintegrating tablet 4 mg  4 mg Oral Q8H PRN [name], MD   4 mg at 11/23/17 1853  . polyethylene glycol (MIRALAX / GLYCOLAX) packet 17 g  17 g Oral BID [name], MD   17 g at 11/29/17 1635  . QUEtiapine (SEROQUEL) tablet 400 mg  400 mg Oral QHS [name], [name], MD   400 mg at 11/28/17 2110    Lab Results:  Results for orders placed or performed during the hospital encounter of 11/16/17 (from the past 48 hour(s))  Hepatic function panel     Status: None   Collection Time: 11/28/17 12:17 PM  Result Value Ref Range   Total Protein 7.4 6.5 - 8.1 g/dL   Albumin 4.0 3.5 - 5.0 g/dL   AST 31 15 - 41 U/L   ALT 30 0 - 44 U/L    Comment: Please note change in reference range.   Alkaline Phosphatase 67 38 - 126 U/L   Total Bilirubin 0.4 0.3 - 1.2 mg/dL   Bilirubin, Direct <0.1 0.0 - 0.2 mg/dL    Comment: Please note change in reference range.   Indirect Bilirubin NOT CALCULATED 0.3 - 0.9 mg/dL    Comment: Performed at [facility], [address] [place], [place] [address]  CBC with Differential/Platelet     Status: None   Collection Time: 11/28/17 12:17 PM  Result Value Ref Range   WBC 5.0 3.6 - 11.0 K/uL   RBC 4.62 3.80 - 5.20 MIL/uL   Hemoglobin 12.9 12.0 - 16.0 g/dL   HCT 38.5 35.0 - 47.0 %   MCV 83.3 80.0 - 100.0 fL   MCH 27.9 26.0 - 34.0 pg   MCHC 33.5 32.0 - 36.0 g/dL   RDW 14.3 11.5 - 14.5 %   Platelets 218  150 - 440 K/uL   Neutrophils Relative % 61 %   Neutro Abs 3.1 1.4 - 6.5 K/uL   Lymphocytes Relative 27 %   Lymphs Abs 1.3 1.0 - 3.6 K/uL   Monocytes Relative 8 %   Monocytes Absolute 0.4 0.2 - 0.9 K/uL   Eosinophils Relative 3 %   Eosinophils Absolute 0.1 0 - 0.7 K/uL   Basophils Relative 1 %   Basophils Absolute 0.1 0 - 0.1 K/uL    Comment: Performed at [facility], [address] [place], [place] [address]    Blood Alcohol level:  Lab Results  Component Value Date   [facility] <10 11/16/2017   ETH <10 40/34/7425    Metabolic Disorder Labs: Lab Results  Component Value Date   HGBA1C 6.1 (H) 11/16/2017   MPG 128.37 11/16/2017   No results found for: PROLACTIN Lab Results  Component Value Date   CHOL 164 11/16/2017   TRIG 117 11/16/2017   HDL 60 11/16/2017   CHOLHDL 2.7 11/16/2017   VLDL 23 11/16/2017   LDLCALC 81 11/16/2017    Physical Findings: AIMS: Facial and Oral Movements Muscles of Facial Expression: None, normal Lips and Perioral Area: None, normal Jaw: None, normal Tongue: None, normal,Extremity Movements Upper (arms, wrists, hands, fingers): None, normal Lower (legs, knees, ankles, toes): None, normal,  , Overall Severity Severity of abnormal movements (highest score from questions above): None, normal Incapacitation due to abnormal movements: None, normal Patient's awareness of abnormal movements (rate only patient's report): No Awareness, Dental Status Current problems with teeth and/or dentures?: No Does patient usually wear dentures?: No  CIWA:  COWS:     Musculoskeletal: Strength & Muscle Tone: within normal limits Gait & Station: normal Patient leans: N/A  Psychiatric Specialty Exam: Physical Exam  Nursing note and vitals reviewed. Constitutional: She appears well-developed and well-nourished.  HENT:  Head: Normocephalic.  Skin: Rash noted. Rash is maculopapular (red raised rash on lower neck, upper chest; a few small red bumps  bilateral upper and lower extremities; lesion in righ ear-pt states it was a blister but she popped it). She is not diaphoretic. No pallor.  Psychiatric: Her speech is normal. Judgment and thought content normal. Her mood appears not anxious. Her affect is not angry. She is not agitated. Cognition and memory are normal. She does not exhibit a depressed mood.    Review of Systems  Constitutional: Negative for chills and fever.  Eyes: Positive for blurred vision.  Gastrointestinal: Negative for constipation.  Skin: Positive for itching and rash.  Neurological: Negative.   Psychiatric/Behavioral: Negative for depression.  All other systems reviewed and are negative.   Blood pressure (!) 126/91, pulse 96, temperature 98 F (36.7 C), temperature source Oral, resp. rate 18, height 5\' 2"  (1.575 m), weight 78 kg (172 lb), SpO2 98 %.Body mass index is 31.46 kg/m.  General Appearance: Casual  Eye Contact:  Good  Speech:  Normal Rate  Volume:  Increased  Mood:  "anxious and depressed" but appears to be due to waiting to hear back from [facility]  Affect:  Full Range  Thought Process:  Goal Directed and Descriptions of Associations: Intact  Orientation:  Full (Time, Place, and Person)  Thought Content:  WDL  Suicidal Thoughts:  No  Homicidal Thoughts:  No  Memory:  Immediate;   Fair Recent;   Fair Remote;   Good  Judgement:  Fair  Insight:  Fair  Psychomotor Activity:  Normal  Concentration:  Concentration: Fair and Attention Span: Fair  Recall:  [employer] of Knowledge:  Fair  Language:  Fair  Akathisia:  No  Handed:  Right  AIMS (if indicated):     Assets:  Communication Skills Desire for Improvement Physical Health Resilience  ADL's:  Intact  Cognition:  WNL  Sleep:  Number of Hours: 7     Treatment Plan Summary: Daily contact with patient to assess and evaluate symptoms and progress in treatment and Medication management   Ms. Prindle is a 52 year old female with a history of  depression, mood instability and substance abuse, on suboxone, awaiting rehab. Supportive therapy provided.   Pt has rash on upper chest/lower neck, and a few spots on upper and lower bilateral extremities.  Lamotrigine discontinued and dermatology consulted on 11/28/17.  Per dermatology, rash is not Stevem Johnson's syndrome or DRESS.   Pt states she had a similar rash with the effexor XR in the past.     Major Depressive Disorder, recurrent, severe; Substance-induced Depression; History of Bipolar Disorder; Anxiety disorder, unspecified. Mood improving slowly.  -Continue seroquel 400 mg nightly -Discontinue lamotrigine on 7/16, (lamotrigine 25 mg qd was started 11/17/17), but pt notified M.D. of rash on 7/16 -Elavil 25 mg nightly started 11/23/2017 -QTc 467  Rash -stopped lamotrigine  -consulted Dermatology -Benadryl 50 mg q 4 hrs prn itch -per dermatologist, hepatic panel and CBC with diff ordered and both Wnl "Triamcinolone 0.1% ointment may be applied to affected areas twice daily until improved to treat any underlying inflammation."    Opioid use disorder, severe -suboxone 8-2 mg BID reduced to once a day on 11/20/17  Cocaine use  disorder, severe with associated withdrawal symptoms -monitor BP -hydroxyzine 50 mg TID PRN anxiety  Hypertension, stable -HCTZ and lisinopril -monitor potassium  Chronic pain, neuropathic --Gabapentin increased from 300 mg to 400 mg TID for neuropathic pain in back and bilateral upper and lower extremities  Ear/neck pain -  per ENT, ear abx dropcompleted  Abdominal pain -H/H stable -KUB shows "scattered" stool, no impaction -Hemoccult is positive for blood. GI consulted and pt scheduled for colonoscopy on 7/12. -3 polyps removed, awaiting patholoy -continue miralax 17 g BID -f/u with GI in 4 weeks -repeat colonoscopy in 3 yrs  Disposition: residential substance abuse rehab Tentative discharge: pt can be discharged as soon as  accepted to rehab  Risks (including but not limited rash, allergic reaction, death, Steven-Johnson's syndrome) and benefits discussed with the patient, and pt voices understanding and consents to the treatment plan.      [name], MD 11/29/2017, 4:38 PM

## 2017-11-29 NOTE — Plan of Care (Addendum)
Patient found in common area upon my arrival. Patient is visible and social this evening. Patient states, "Be sure to tell the doctor that I am really depressed today." Reports that difficulty getting into [facility] is causing her depression. Affect is incongruent with reported mood. Patient is bright, talkative, and social throughout the evening. Denies SI/HI/AVH. Complains of back pain, given Motrin with positive results. Given eye gtts for irritation in left eye. Given Benadryl for itching. Rash appears to have improved upon visual assessment. Compliant with HS medications and staff direction. Will continue to monitor throughout the shift. Patient slept 7.25 hours. No apparent distress. Will endorse care to oncoming shift.  Problem: Education: Goal: Knowledge of [place] [facility] Education information/materials will improve Outcome: Progressing Goal: Verbalization of understanding the information provided will improve Outcome: Progressing   Problem: Safety: Goal: Periods of time without injury will increase Outcome: Progressing   Problem: Education: Goal: Emotional status will improve Outcome: Not Progressing Goal: Mental status will improve Outcome: Not Progressing

## 2017-11-29 NOTE — Progress Notes (Signed)
Recreation Therapy Notes  Date: 11/29/2017  Time: 3:00pm  Location: Craft room  Behavioral response: Appropriate  Group Type: Game  Participation level: Active  Communication: Patient was social with peers and staff.  Comments: N/A    LRT/CTRS            11/29/2017 4:02 PM

## 2017-11-29 NOTE — Progress Notes (Signed)
Recreation Therapy Notes  Date: 11/29/2017  Time: 9:30 am  Location: Craft Room  Behavioral response: Appropriate    Intervention Topic: Goals  Discussion/Intervention:  Group content on today was focused on goals. Patients described what goals are and how they define goals. Individuals expressed how they go about setting goals and reaching them. The group identified how important goals are and if they make short term goals to reach long term goals. Patients described how many goals they work on at a time and what affects them not reaching their goal. Individuals described how much time they put into planning and obtaining their goals. The group participated in the intervention "My Goal Board" and made personal goal boards to help them achieve their goal.  Clinical Observations/Feedback:  Patient came to group late due to unknown reasons. Individual participated in the intervention and was social with peers and staff during group.    LRT/CTRS            11/29/2017 11:30 AM

## 2017-11-29 NOTE — Plan of Care (Signed)
Pt. Verbalizes understanding of provided education. Pt. Denies SI/HI. Pt. Verbally is able to contract for safety. Pt. Monitored by staff per MD orders for safety.    Problem: Education: Goal: Knowledge of [place] [facility] Education information/materials will improve Outcome: Progressing   Problem: Safety: Goal: Periods of time without injury will increase Outcome: Progressing   Problem: Safety: Goal: Ability to disclose and discuss suicidal ideas will improve Outcome: Progressing   Problem: Safety: Goal: Ability to remain free from injury will improve Outcome: Progressing

## 2017-11-30 ENCOUNTER — Telehealth: Payer: Self-pay | Admitting: Gastroenterology

## 2017-11-30 DIAGNOSIS — F3289 Other specified depressive episodes: Secondary | ICD-10-CM

## 2017-11-30 DIAGNOSIS — F1994 Other psychoactive substance use, unspecified with psychoactive substance-induced mood disorder: Secondary | ICD-10-CM

## 2017-11-30 DIAGNOSIS — F1420 Cocaine dependence, uncomplicated: Secondary | ICD-10-CM

## 2017-11-30 DIAGNOSIS — F1120 Opioid dependence, uncomplicated: Secondary | ICD-10-CM

## 2017-11-30 MED ORDER — TRIAMCINOLONE ACETONIDE 0.1 % EX OINT
TOPICAL_OINTMENT | Freq: Three times a day (TID) | CUTANEOUS | Status: DC | PRN
  Administered 2017-11-30 – 2017-12-04 (×2): via TOPICAL
  Filled 2017-11-30: qty 15

## 2017-11-30 NOTE — Progress Notes (Addendum)
[facility] MD Progress Note  11/30/2017 11:30 AM Teresa Jefferson  MRN:  [id number]  Subjective:   Patient reports that she had a stomachache this morning and vomited after breakfast.  She reports feeling better later in the morning.  Patient remains motivated to get into an inpatient rehab and was thrilled that she was accepted to [facility].  I conveyed to the patient and the social worker that the discharge planners at the inpatient rehab facility will attempt to assist the patient in getting in with an outpatient Suboxone provider during the 2 weeks she is in the inpatient rehab program.  Patient voices understanding.  Patient understands that if she will be maintained on suboxone while at [facility], but will not be prescribed Suboxone from the providers at the [facility] after discharge. She will need to have an outpatient suboxone provider.    Patient denies suicidal ideation, homicidal ideation, auditory hallucinations, visual hallucinations, and paranoia.  Patient is medication compliant.   Principal Problem: Severe episode of recurrent major depressive disorder, without psychotic features ([place]) Diagnosis:   Patient Active Problem List   Diagnosis Date Noted  . Essential hypertension [I10] 11/17/2017  . Asthma [J45.909] 11/17/2017  . 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[place]) [W09.81, F32.89] 11/17/2017  . Cocaine use disorder, severe, dependence ([place]) [F14.20] 11/17/2017  . Opioid use disorder, severe, dependence ([place]) [F11.20] 11/17/2017  . Severe episode of recurrent major depressive disorder, without psychotic features ([place]) [F33.2] 11/16/2017  . Opiate abuse, continuous ([place]) [F11.10] 11/16/2017  . Cocaine abuse ([place]) [F14.10] 11/16/2017  . Suicidal ideation [R45.851] 11/16/2017  . Chronic pain [G89.29] 11/16/2017   Total Time spent with patient, discussing pt with treatment team, talking with staff at [facility], reviewing chart, and providing supportive therapy.: 45 minutes  Past  Psychiatric History: depression, substance abuse  Past Medical History:  Past Medical History:  Diagnosis Date  . Bipolar 1 disorder ([place])   . Depression   . Hypertension   . Neuropathy   . Schizophrenia ([place])   . Thyroid disease     Past Surgical History:  Procedure Laterality Date  . ABDOMINAL HYSTERECTOMY    . COLONOSCOPY N/A 11/23/2017   Procedure: COLONOSCOPY;  Surgeon: Lin Landsman, MD;  Location: [facility] ENDOSCOPY;  Service: Gastroenterology;  Laterality: N/A;  . COLONOSCOPY N/A 11/24/2017   Procedure: COLONOSCOPY;  Surgeon: Lin Landsman, MD;  Location: [facility] ENDOSCOPY;  Service: Gastroenterology;  Laterality: N/A;  . KNEE SURGERY Right   . TONSILLECTOMY     Family History: History reviewed. No pertinent family history. Family Psychiatric  History: see H&P Social History:  Social History   Substance and Sexual Activity  Alcohol Use No   Comment: occ     Social History   Substance and Sexual Activity  Drug Use Yes   Comment: crack and opiates    Social History   Socioeconomic History  . Marital status: Legally Separated    Spouse name: Not on file  . Number of children: Not on file  . Years of education: Not on file  . Highest education level: Not on file  Occupational History  . Not on file  Social Needs  . Financial resource strain: Not on file  . Food insecurity:    Worry: Not on file    Inability: Not on file  . Transportation needs:    Medical: Not on file    Non-medical: Not on file  Tobacco Use  . Smoking status: Never Smoker  . Smokeless tobacco: Never Used  Substance and Sexual Activity  . Alcohol use: No    Comment: occ  . Drug use: Yes    Comment: crack and opiates  . Sexual activity: Not on file  Lifestyle  . Physical activity:    Days per week: Not on file    Minutes per session: Not on file  . Stress: Not on file  Relationships  . Social connections:    Talks on phone: Not on file    Gets together: Not on file     Attends religious service: Not on file    Active member of club or organization: Not on file    Attends meetings of clubs or organizations: Not on file    Relationship status: Not on file  Other Topics Concern  . Not on file  Social History Narrative  . Not on file   Additional Social History: see H&P  Sleep: Good  Appetite:  Good  Current Medications: Current Facility-Administered Medications  Medication Dose Route Frequency Provider Last Rate Last Dose  . 0.9 %  sodium chloride infusion   Intravenous Continuous Lin Landsman, MD 20 mL/hr at 11/23/17 1321 1,000 mL at 11/23/17 1321  . 0.9 %  sodium chloride infusion   Intravenous Continuous Vanga, Tally Due, MD      . acetaminophen (TYLENOL) tablet 650 mg  650 mg Oral Q6H PRN [name], [name], MD   650 mg at 11/23/17 0503  . albuterol (PROVENTIL HFA;VENTOLIN HFA) 108 (90 Base) MCG/ACT inhaler 1-2 puff  1-2 puff Inhalation Q6H PRN [name], MD      . alum & mag hydroxide-simeth (MAALOX/MYLANTA) 200-200-20 MG/5ML suspension 30 mL  30 mL Oral Q4H PRN [name], [name], MD      . amitriptyline (ELAVIL) tablet 25 mg  25 mg Oral QHS [name], MD   25 mg at 11/29/17 2111  . buprenorphine-naloxone (SUBOXONE) 8-2 mg per SL tablet 1 tablet  1 tablet Sublingual Daily [name], MD   1 tablet at 11/30/17 0813  . diphenhydrAMINE (BENADRYL) capsule 50 mg  50 mg Oral Q4H PRN [name], MD   50 mg at 11/29/17 0000  . gabapentin (NEURONTIN) capsule 400 mg  400 mg Oral TID PC [name], MD   400 mg at 11/30/17 0813  . hydrochlorothiazide (HYDRODIURIL) tablet 25 mg  25 mg Oral Daily [name], MD   25 mg at 11/30/17 0813  . hydrOXYzine (ATARAX/VISTARIL) tablet 50 mg  50 mg Oral TID PRN [name], MD   50 mg at 11/29/17 1426  . ibuprofen (ADVIL,MOTRIN) tablet 600 mg  600 mg Oral Q6H PRN [name], MD   600 mg at 11/30/17 0813  . lisinopril (PRINIVIL,ZESTRIL) tablet 5 mg  5 mg Oral Daily [name], MD   5 mg at  11/30/17 0814  . loratadine (CLARITIN) tablet 10 mg  10 mg Oral Daily Pucilowska, Jolanta B, MD   10 mg at 11/30/17 0813  . magnesium hydroxide (MILK OF MAGNESIA) suspension 30 mL  30 mL Oral Daily PRN [name], [name], MD   30 mL at 11/21/17 2128  . naphazoline-pheniramine (NAPHCON-A) 0.025-0.3 % ophthalmic solution 2 drop  2 drop Both Eyes QID PRN Pucilowska, Jolanta B, MD   2 drop at 11/29/17 2112  . neomycin-bacitracin-polymyxin (NEOSPORIN) ointment   Topical BID [name], MD   1 application at 80/99/83 1635  . ondansetron (ZOFRAN-ODT) disintegrating tablet 4 mg  4 mg Oral Q8H PRN [name], MD   4  mg at 11/30/17 0906  . polyethylene glycol (MIRALAX / GLYCOLAX) packet 17 g  17 g Oral BID [name], MD   17 g at 11/30/17 0813  . QUEtiapine (SEROQUEL) tablet 400 mg  400 mg Oral QHS [name], [name], MD   400 mg at 11/29/17 2111  . triamcinolone ointment (KENALOG) 0.1 %   Topical BID [name], MD        Lab Results:  Results for orders placed or performed during the hospital encounter of 11/16/17 (from the past 48 hour(s))  Hepatic function panel     Status: None   Collection Time: 11/28/17 12:17 PM  Result Value Ref Range   Total Protein 7.4 6.5 - 8.1 g/dL   Albumin 4.0 3.5 - 5.0 g/dL   AST 31 15 - 41 U/L   ALT 30 0 - 44 U/L    Comment: Please note change in reference range.   Alkaline Phosphatase 67 38 - 126 U/L   Total Bilirubin 0.4 0.3 - 1.2 mg/dL   Bilirubin, Direct <0.1 0.0 - 0.2 mg/dL    Comment: Please note change in reference range.   Indirect Bilirubin NOT CALCULATED 0.3 - 0.9 mg/dL    Comment: Performed at [facility], [address] [place], [place] [address]  CBC with Differential/Platelet     Status: None   Collection Time: 11/28/17 12:17 PM  Result Value Ref Range   WBC 5.0 3.6 - 11.0 K/uL   RBC 4.62 3.80 - 5.20 MIL/uL   Hemoglobin 12.9 12.0 - 16.0 g/dL   HCT 38.5 35.0 - 47.0 %   MCV 83.3 80.0 - 100.0 fL   MCH 27.9 26.0 - 34.0 pg    MCHC 33.5 32.0 - 36.0 g/dL   RDW 14.3 11.5 - 14.5 %   Platelets 218 150 - 440 K/uL   Neutrophils Relative % 61 %   Neutro Abs 3.1 1.4 - 6.5 K/uL   Lymphocytes Relative 27 %   Lymphs Abs 1.3 1.0 - 3.6 K/uL   Monocytes Relative 8 %   Monocytes Absolute 0.4 0.2 - 0.9 K/uL   Eosinophils Relative 3 %   Eosinophils Absolute 0.1 0 - 0.7 K/uL   Basophils Relative 1 %   Basophils Absolute 0.1 0 - 0.1 K/uL    Comment: Performed at [facility], [address] [place], [place] [address]    Blood Alcohol level:  Lab Results  Component Value Date   [facility] <10 11/16/2017   ETH <10 96/78/9381    Metabolic Disorder Labs: Lab Results  Component Value Date   HGBA1C 6.1 (H) 11/16/2017   MPG 128.37 11/16/2017   No results found for: PROLACTIN Lab Results  Component Value Date   CHOL 164 11/16/2017   TRIG 117 11/16/2017   HDL 60 11/16/2017   CHOLHDL 2.7 11/16/2017   VLDL 23 11/16/2017   LDLCALC 81 11/16/2017    Physical Findings: AIMS: Facial and Oral Movements Muscles of Facial Expression: None, normal Lips and Perioral Area: None, normal Jaw: None, normal Tongue: None, normal,Extremity Movements Upper (arms, wrists, hands, fingers): None, normal Lower (legs, knees, ankles, toes): None, normal,  , Overall Severity Severity of abnormal movements (highest score from questions above): None, normal Incapacitation due to abnormal movements: None, normal Patient's awareness of abnormal movements (rate only patient's report): No Awareness, Dental Status Current problems with teeth and/or dentures?: No Does patient usually wear dentures?: No  CIWA:    COWS:     Musculoskeletal: Strength &  Muscle Tone: within normal limits Gait & Station: normal Patient leans: N/A  Psychiatric Specialty Exam: Physical Exam  Nursing note and vitals reviewed. Constitutional: She appears well-developed and well-nourished.  HENT:  Head: Normocephalic.  Skin: Rash (rash decreasing with  triamcinolone cream) noted.  Psychiatric: Her speech is normal. Judgment and thought content normal. Her mood appears not anxious. Her affect is not angry. She is not agitated. Cognition and memory are normal. She does not exhibit a depressed mood.    Review of Systems  Constitutional: Negative for chills and fever.  Gastrointestinal: Negative for constipation.  Skin: Positive for rash (improving with triamcinolone cream).  Neurological: Negative.   Psychiatric/Behavioral: Negative for depression.  All other systems reviewed and are negative.   Blood pressure 110/86, pulse 84, temperature 98.2 F (36.8 C), temperature source Oral, resp. rate 18, height 5\' 2"  (1.575 m), weight 78 kg (172 lb), SpO2 97 %.Body mass index is 31.46 kg/m.  General Appearance: Casual  Eye Contact:  Good  Speech:  Normal Rate  Volume:  Increased  Mood:  Euthymic  Affect:  Full Range  Thought Process:  Goal Directed and Descriptions of Associations: Intact  Orientation:  Full (Time, Place, and Person)  Thought Content:  WDL  Suicidal Thoughts:  No  Homicidal Thoughts:  No  Memory:  Immediate;   Fair Recent;   Fair Remote;   Good  Judgement:  Fair  Insight:  Fair  Psychomotor Activity:  Normal  Concentration:  Concentration: Fair and Attention Span: Fair  Recall:  [employer] of Knowledge:  Fair  Language:  Fair  Akathisia:  No  Handed:  Right  AIMS (if indicated):     Assets:  Communication Skills Desire for Improvement Physical Health Resilience  ADL's:  Intact  Cognition:  WNL  Sleep:  Number of Hours: 7.25     Treatment Plan Summary: Daily contact with patient to assess and evaluate symptoms and progress in treatment and Medication management   Ms. Outten is a 52 year old female with a history of depression, mood instability and substance abuse, on suboxone; accepted to [facility].  Waiting to hear back for acceptance date. Supportive therapy provided.   Pt has rash on upper chest/lower  neck, and a few spots on upper and lower bilateral extremities, that has improved with triamcinolone ointment.  Lamotrigine discontinued and dermatology consulted on 11/28/17.  Per dermatology, rash is not Stevem Johnson's syndrome or DRESS.   Pt states she had a similar rash with the effexor XR in the past.     Major Depressive Disorder, recurrent, severe; Substance-induced Depression; History of Bipolar Disorder; Anxiety disorder, unspecified. Mood improving slowly.  -Continue seroquel 400 mg nightly -Discontinue lamotrigine on 7/16, (lamotrigine 25 mg qd was started 11/17/17), but pt notified M.D. of rash on 7/16 -Elavil 25 mg nightly started 11/23/2017 -QTc 467  Rash -stopped lamotrigine  -consulted Dermatology -Benadryl 50 mg q 4 hrs prn itch -per dermatologist, hepatic panel and CBC with diff ordered and both Wnl Triamcinolone 0.1% ointment twice daily until improved to treat any underlying inflammation    Opioid use disorder, severe -suboxone 8-2 mg BID reduced to once a day on 11/20/17  Cocaine use disorder, severe with associated withdrawal symptoms -monitor BP -hydroxyzine 50 mg TID PRN anxiety  Hypertension, stable -HCTZ and lisinopril -monitor potassium  Chronic pain, neuropathic --Gabapentin increased from 300 mg to 400 mg TID for neuropathic pain in back and bilateral upper and lower extremities  Ear/neck pain -  per ENT,  ear abx dropcompleted  Abdominal pain -H/H stable -KUB shows "scattered" stool, no impaction -Hemoccult is positive for blood. GI consulted and pt scheduled for colonoscopy on 7/12. -3 polyps removed, awaiting patholoy -continue miralax 17 g BID -f/u with GI in 4 weeks -repeat colonoscopy in 3 yrs  Disposition: residential substance abuse rehab Tentative discharge: Patient accepted to inpatient rehab facility ([facility]).  Scheduler at the inpatient rehab facility will call social worker with the date that patient will be able to  present to the facility.  Risks (including but not limited rash, allergic reaction, death, Steven-Johnson's syndrome) and benefits discussed with the patient, and pt voices understanding and consents to the treatment plan.      [name], MD 11/30/2017, 11:30 AM

## 2017-11-30 NOTE — Progress Notes (Signed)
Recreation Therapy Notes  Date: 11/30/2017  Time: 9:30 am   Location: Craft Room   Behavioral response: N/A   Intervention Topic: Problem Solving  Discussion/Intervention: Patient did not attend group.   Clinical Observations/Feedback:  Patient did not attend group.     LRT/CTRS           11/30/2017 12:34 PM

## 2017-11-30 NOTE — Progress Notes (Signed)
Recreation Therapy Notes  Date: 11/30/2017  Time: 3:00pm  Location: Craft room  Behavioral response: Appropriate  Group Type: Craft  Participation level: Active  Communication: Patient was social with peers and staff.  Comments: N/A    LRT/CTRS          11/30/2017 4:06 PM

## 2017-11-30 NOTE — BHH Group Notes (Signed)
LCSW Group Therapy Note 11/30/2017 9:00 AM  Type of Therapy and Topic:  Group Therapy:  Setting Goals  Participation Level:  Did Not Attend  Description of Group: In this process group, patients discussed using strengths to work toward goals and address challenges.  Patients identified two positive things about themselves and one goal they were working on.  Patients were given the opportunity to share openly and support each other's plan for self-empowerment.  The group discussed the value of gratitude and were encouraged to have a daily reflection of positive characteristics or circumstances.  Patients were encouraged to identify a plan to utilize their strengths to work on current challenges and goals.  Therapeutic Goals 1. Patient will verbalize personal strengths/positive qualities and relate how these can assist with achieving desired personal goals 2. Patients will verbalize affirmation of peers plans for personal change and goal setting 3. Patients will explore the value of gratitude and positive focus as related to successful achievement of goals 4. Patients will verbalize a plan for regular reinforcement of personal positive qualities and circumstances.  Summary of Patient Progress:  Teresa Jefferson was invited to today's group, but chose not to attend.     Therapeutic Modalities Cognitive Behavioral Therapy Motivational Interviewing    Teresa Jefferson, [place] 11/30/2017 11:23 AM

## 2017-11-30 NOTE — BHH Group Notes (Signed)
  11/30/2017  Time: 1PM  Type of Therapy/Topic:  Group Therapy:  Balance in Life  Participation Level:  Active  Description of Group:   This group will address the concept of balance and how it feels and looks when one is unbalanced. Patients will be encouraged to process areas in their lives that are out of balance and identify reasons for remaining unbalanced. Facilitators will guide patients in utilizing problem-solving interventions to address and correct the stressor making their life unbalanced. Understanding and applying boundaries will be explored and addressed for obtaining and maintaining a balanced life. Patients will be encouraged to explore ways to assertively make their unbalanced needs known to significant others in their lives, using other group members and facilitator for support and feedback.  Therapeutic Goals: 1. Patient will identify two or more emotions or situations they have that consume much of in their lives. 2. Patient will identify signs/triggers that life has become out of balance:  3. Patient will identify two ways to set boundaries in order to achieve balance in their lives:  4. Patient will demonstrate ability to communicate their needs through discussion and/or role plays  Summary of Patient Progress: Pt continues to work towards their tx goals but has not yet reached them. Pt was able to appropriately participate in group discussion, and was able to offer support/validation to other group members. Pt reported one area of her life she would like to devote more attention to is, "bgeing a better mom and grandmother." Pt reported one area of her life she'd like to devote less attention to is, "my attitude."   Therapeutic Modalities:   Cognitive Behavioral Therapy Solution-Focused Therapy Assertiveness Training  Alden Hipp, MSW, LCSW Clinical Social Worker 11/30/2017 2:08 PM

## 2017-11-30 NOTE — Telephone Encounter (Signed)
-----   Message from Darl Householder, RMA sent at 11/29/2017 11:22 AM EDT ----- Regarding: follow up Please schedule pt follow up appt in 4 week with Dr. Marius Ditch

## 2017-11-30 NOTE — Plan of Care (Signed)
Patients affect is bright,denies SI,HI and AVH.Zofran helped with her stomach issues.Appropriate with staff & peers.Appetite and energy level good.Attended groups.Support and encouragement given.

## 2017-11-30 NOTE — Telephone Encounter (Signed)
Left vm for pt to call office and schedule apt  °

## 2017-11-30 NOTE — Plan of Care (Addendum)
Patient found in common area upon my arrival. Patient is visible and social this evening. Reports improvement in mood since being accepted at [facility]. Denies SI/HI/AVH. Reports good mood and day. Affect is bright and animated. Complains of back pain @2030 , given Motrin without relief. @2153 , given Tylenol. Will monitor for efficacy. Given Benadryl for itching with relief reported. Reports eating and voiding adequately. Compliant with HS medications and staff direction. Attends group. Q 15 minute checks maintained. Will continue to monitor throughout the shift. Patient slept 6.25 hours. No apparent distress. Complains of back pain 9/10, given Motrin. Will endorse acre to oncoming shift.  Problem: Education: Goal: Knowledge of [facility] Education information/materials will improve Outcome: Progressing Goal: Emotional status will improve Outcome: Progressing Goal: Mental status will improve Outcome: Progressing Goal: Verbalization of understanding the information provided will improve Outcome: Progressing   Problem: Safety: Goal: Periods of time without injury will increase Outcome: Progressing   Problem: Safety: Goal: Ability to disclose and discuss suicidal ideas will improve Outcome: Progressing   Problem: Safety: Goal: Ability to remain free from injury will improve Outcome: Progressing

## 2017-12-01 DIAGNOSIS — F3289 Other specified depressive episodes: Secondary | ICD-10-CM | POA: Diagnosis not present

## 2017-12-01 DIAGNOSIS — F1420 Cocaine dependence, uncomplicated: Secondary | ICD-10-CM | POA: Diagnosis not present

## 2017-12-01 DIAGNOSIS — F1994 Other psychoactive substance use, unspecified with psychoactive substance-induced mood disorder: Secondary | ICD-10-CM | POA: Diagnosis not present

## 2017-12-01 DIAGNOSIS — F1120 Opioid dependence, uncomplicated: Secondary | ICD-10-CM | POA: Diagnosis not present

## 2017-12-01 NOTE — Progress Notes (Signed)
D: Pt denies SI/HI/AVH. Pt is pleasant and cooperative. Pt. has no Complaints besides some lower back pain. Pt. Given PRN medication for pain to good effect.  Patient interaction appropriate. Pt. Reports looking forward to going to rehab facility.   A: Q x 15 minute observation checks were completed for safety. Patient was provided with education.  Patient was given scheduled/prn medications. Patient  was encourage to attend groups, participate in unit activities and continue with plan of care. Pt. Chart and plans of care reviewed. Pt. Given support and encouragement.   R: Patient is complaint with medication and unit procedures.             Precautionary checks every 15 minutes for safety maintained, room free of safety hazards, patient sustains no injury or falls during this shift.

## 2017-12-01 NOTE — Plan of Care (Signed)
Pt. Verbalizes understanding of provided education. Pt. Denies Si/hi. Pt. Verbally is able to contract for safety. Pt. Monitored by for safety.    Problem: Education: Goal: Knowledge of [facility] Education information/materials will improve Outcome: Progressing   Problem: Safety: Goal: Periods of time without injury will increase Outcome: Progressing   Problem: Safety: Goal: Ability to disclose and discuss suicidal ideas will improve Outcome: Progressing   Problem: Safety: Goal: Ability to remain free from injury will improve Outcome: Progressing

## 2017-12-01 NOTE — BHH Group Notes (Signed)
12/01/2017 1PM  Type of Therapy and Topic:  Group Therapy:  Feelings around Relapse and Recovery  Participation Level:  Active   Description of Group:    Patients in this group will discuss emotions they experience before and after a relapse. They will process how experiencing these feelings, or avoidance of experiencing them, relates to having a relapse. Facilitator will guide patients to explore emotions they have related to recovery. Patients will be encouraged to process which emotions are more powerful. They will be guided to discuss the emotional reaction significant others in their lives may have to patients' relapse or recovery. Patients will be assisted in exploring ways to respond to the emotions of others without this contributing to a relapse.  Therapeutic Goals: 1. Patient will identify two or more emotions that lead to a relapse for them 2. Patient will identify two emotions that result when they relapse 3. Patient will identify two emotions related to recovery 4. Patient will demonstrate ability to communicate their needs through discussion and/or role plays   Summary of Patient Progress: Actively and appropriately engaged in the group. Patient was able to provide support and validation to other group members.Patient practiced active listening when interacting with the facilitator and other group members. Pt. Reports a coping skill that she can use while working towards recovery is "walking, cleaning up and coloring". Patient is still in the process of obtaining treatment goals.      Therapeutic Modalities:   Cognitive Behavioral Therapy Solution-Focused Therapy Assertiveness Training Relapse Prevention Therapy   Darin Engels, [place] 12/01/2017 2:26 PM

## 2017-12-01 NOTE — Plan of Care (Signed)
Patient is appropriate in the unit.Denies SI,HI and AVH.Patient verbalized minimal somatic complaints today.Attended groups.Compliant with medications.Looking forward for discharge to rehab.support and encouragement given.

## 2017-12-01 NOTE — BHH Group Notes (Signed)
[facility] Group Notes:  (Nursing/MHT/Case Management/Adjunct)  Date:  12/01/2017  Time:  2:51 PM  Type of Therapy:  Psychoeducational Skills  Participation Level:  Active  Participation Quality:  Appropriate, Attentive and Sharing  Affect:  Appropriate  Cognitive:  Alert and Appropriate  Insight:  Appropriate  Engagement in Group:  Engaged  Modes of Intervention:  Activity and Education  Summary of Progress/Problems:  Teresa Jefferson  12/01/2017, 2:51 PM

## 2017-12-01 NOTE — Tx Team (Signed)
Interdisciplinary Treatment and Diagnostic Plan Update  12/01/2017 Time of Session: 11:00 AM Teresa Jefferson MRN: [id number]  Principal Diagnosis: Severe episode of recurrent major depressive disorder, without psychotic features ([place])  Secondary Diagnoses: Principal Problem:   Severe episode of recurrent major depressive disorder, without psychotic features ([place]) Active Problems:   Essential hypertension   Asthma   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[place])   Cocaine use disorder, severe, dependence ([place])   Opioid use disorder, severe, dependence ([place])   Current Medications:  Current Facility-Administered Medications  Medication Dose Route Frequency Provider Last Rate Last Dose  . 0.9 %  sodium chloride infusion   Intravenous Continuous Lin Landsman, MD 20 mL/hr at 11/23/17 1321 1,000 mL at 11/23/17 1321  . 0.9 %  sodium chloride infusion   Intravenous Continuous Vanga, Tally Due, MD      . acetaminophen (TYLENOL) tablet 650 mg  650 mg Oral Q6H PRN [name], [name], MD   650 mg at 11/30/17 2152  . albuterol (PROVENTIL HFA;VENTOLIN HFA) 108 (90 Base) MCG/ACT inhaler 1-2 puff  1-2 puff Inhalation Q6H PRN [name], MD      . alum & mag hydroxide-simeth (MAALOX/MYLANTA) 200-200-20 MG/5ML suspension 30 mL  30 mL Oral Q4H PRN [name], [name], MD      . amitriptyline (ELAVIL) tablet 25 mg  25 mg Oral QHS [name], MD   25 mg at 11/30/17 2121  . buprenorphine-naloxone (SUBOXONE) 8-2 mg per SL tablet 1 tablet  1 tablet Sublingual Daily [name], MD   1 tablet at 12/01/17 0809  . diphenhydrAMINE (BENADRYL) capsule 50 mg  50 mg Oral Q4H PRN [name], MD   50 mg at 11/30/17 2121  . gabapentin (NEURONTIN) capsule 400 mg  400 mg Oral TID PC [name], MD   400 mg at 12/01/17 0814  . hydrochlorothiazide (HYDRODIURIL) tablet 25 mg  25 mg Oral Daily [name], MD   25 mg at 12/01/17 0809  . hydrOXYzine (ATARAX/VISTARIL) tablet 50 mg  50 mg Oral TID  PRN [name], MD   50 mg at 11/29/17 1426  . ibuprofen (ADVIL,MOTRIN) tablet 600 mg  600 mg Oral Q6H PRN [name], MD   600 mg at 12/01/17 0630  . lisinopril (PRINIVIL,ZESTRIL) tablet 5 mg  5 mg Oral Daily [name], MD   5 mg at 11/30/17 0814  . loratadine (CLARITIN) tablet 10 mg  10 mg Oral Daily Pucilowska, Jolanta B, MD   10 mg at 12/01/17 0809  . magnesium hydroxide (MILK OF MAGNESIA) suspension 30 mL  30 mL Oral Daily PRN [name], [name], MD   30 mL at 11/21/17 2128  . naphazoline-pheniramine (NAPHCON-A) 0.025-0.3 % ophthalmic solution 2 drop  2 drop Both Eyes QID PRN Pucilowska, Jolanta B, MD   2 drop at 11/29/17 2112  . neomycin-bacitracin-polymyxin (NEOSPORIN) ointment   Topical BID [name], MD   1 application at 93/26/71 1635  . ondansetron (ZOFRAN-ODT) disintegrating tablet 4 mg  4 mg Oral Q8H PRN [name], MD   4 mg at 12/01/17 0636  . polyethylene glycol (MIRALAX / GLYCOLAX) packet 17 g  17 g Oral BID [name], MD   17 g at 12/01/17 0810  . QUEtiapine (SEROQUEL) tablet 400 mg  400 mg Oral QHS [name], [name], MD   400 mg at 11/30/17 2121  . triamcinolone ointment (KENALOG) 0.1 %   Topical TID PRN [name], MD       PTA Medications:  Medications Prior to Admission  Medication Sig Dispense Refill Last Dose  . levothyroxine (SYNTHROID, LEVOTHROID) 125 MCG tablet Take 125 mcg by mouth daily before breakfast.     . lisinopril-hydrochlorothiazide (PRINZIDE,ZESTORETIC) 20-25 MG tablet Take 1 tablet by mouth daily.   04/28/2017 at Unknown time  . QUEtiapine (SEROQUEL) 400 MG tablet Take 1 tablet (400 mg total) by mouth at bedtime. 7 tablet 0 04/28/2017 at  2200  . traMADol (ULTRAM) 50 MG tablet Take 1 tablet (50 mg total) by mouth every 6 (six) hours as needed. 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   Physician Treatment Plan for Primary Diagnosis: Severe episode of recurrent major depressive disorder, without psychotic features ([place]) Long Term Goal(s): Improvement in symptoms so as ready for discharge Improvement in symptoms so as ready for discharge   Short Term Goals: Ability to identify changes in lifestyle to reduce recurrence of condition will improve Ability to verbalize feelings will improve Ability to identify triggers associated with substance abuse/mental health issues will improve Ability to verbalize feelings will improve Ability to disclose and discuss suicidal ideas Ability to identify and develop effective coping behaviors will improve Compliance with prescribed medications will improve Ability to identify triggers associated with substance abuse/mental health issues will improve  Medication Management: Evaluate patient's response, side effects, and tolerance of medication regimen.  Therapeutic Interventions: 1 to 1 sessions, Unit Group sessions and Medication administration.  Evaluation of Outcomes: Progressing  Physician Treatment Plan for Secondary Diagnosis: Principal Problem:   Severe episode of recurrent major depressive disorder, without psychotic features ([place]) Active Problems:   Essential hypertension   Asthma   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[facility])   Cocaine use disorder, severe, dependence ([place])   Opioid use disorder, severe, dependence ([place])  Long Term Goal(s): Improvement in symptoms so as ready for discharge Improvement in symptoms so as ready for discharge   Short Term Goals: Ability to identify changes in lifestyle to reduce recurrence of condition will improve Ability to verbalize feelings will  improve Ability to identify triggers associated with substance abuse/mental health issues will improve Ability to verbalize feelings will improve Ability to disclose and discuss suicidal ideas Ability to identify and develop effective coping behaviors will improve Compliance with prescribed medications will improve Ability to identify triggers associated with substance abuse/mental health issues will improve     Medication Management: Evaluate patient's response, side effects, and tolerance of medication regimen.  Therapeutic Interventions: 1 to 1 sessions, Unit Group sessions and Medication administration.  Evaluation of Outcomes: Progressing   RN Treatment Plan for Primary Diagnosis: Severe episode of recurrent major depressive disorder, without psychotic features ([place]) Long Term Goal(s): Knowledge of disease and therapeutic regimen to maintain health will improve  Short Term Goals: Ability to participate in decision making will improve, Ability to disclose and discuss suicidal ideas and Compliance with prescribed medications will improve  Medication Management: RN will administer medications as ordered by provider, will assess and evaluate patient's response and provide education to patient for prescribed medication. RN will report any adverse and/or side effects to prescribing provider.  Therapeutic Interventions: 1 on 1 counseling sessions, Psychoeducation, Medication administration, Evaluate responses to treatment, Monitor vital signs and CBGs as ordered, Perform/monitor CIWA,  COWS, AIMS and Fall Risk screenings as ordered, Perform wound care treatments as ordered.  Evaluation of Outcomes: Progressing   LCSW Treatment Plan for Primary Diagnosis: Severe episode of recurrent major depressive disorder, without psychotic features ([place]) Long Term Goal(s): Safe transition to appropriate next level of care at discharge, Engage patient in therapeutic group addressing interpersonal  concerns.  Short Term Goals: Engage patient in aftercare planning with referrals and resources and Increase skills for wellness and recovery  Therapeutic Interventions: Assess for all discharge needs, 1 to 1 time with Social worker, Explore available resources and support systems, Assess for adequacy in community support network, Educate family and significant other(s) on suicide prevention, Complete Psychosocial Assessment, Interpersonal group therapy.  Evaluation of Outcomes: Progressing   Progress in Treatment: Attending groups: Yes. Participating in groups: No. Taking medication as prescribed: Yes. Toleration medication: Yes. Family/Significant other contact made: No, will contact:  Pt refused to have family support contacted. Patient understands diagnosis: Yes. Discussing patient identified problems/goals with staff: Yes. Medical problems stabilized or resolved: Yes. Denies suicidal/homicidal ideation: Yes. Issues/concerns per patient self-inventory: No. Other: n/a  New problem(s) identified: No, Describe:  No new problems identified  New Short Term/Long Term Goal(s):  Patient Goals: "To get clean so I can get back in with my family and grandkids;  Get my mind where I can make better decisions"   Discharge Plan or Barriers: Teresa Jefferson will be admitted to [facility] for inpatient substance use treatment services on December 06, 2017.  Reason for Continuation of Hospitalization: Anxiety Medication stabilization   Estimated Length of Stay: 3-5 days  Recreational Therapy: Patient Stressors: Homeless, Relationship  Patient Goal: Patient will identify 3 positive coping skills strategies to use post d/c within 5 recreation therapy group sessions  Attendees: Patient:  12/01/2017 9:34 AM  Physician: Andrena Mews, MD 12/01/2017 9:34 AM  Nursing: Elige Radon, RN 12/01/2017 9:34 AM  RN Care Manager: 12/01/2017 9:34 AM  Social Worker: Derrek Gu, LCSW 12/01/2017 9:34 AM   Recreational Therapist: Roanna Epley, LRT 12/01/2017 9:34 AM  Other: Alden Hipp, LCSW 12/01/2017 9:34 AM  Other: Darin Engels, [place] 12/01/2017 9:34 AM  Other: 12/01/2017 9:34 AM    Scribe for Treatment Team: Devona Konig, LCSW 12/01/2017 9:34 AM

## 2017-12-01 NOTE — Progress Notes (Signed)
Recreation Therapy Notes   Date: 12/01/2017  Time: 9:30 am  Location: [facility]  Behavioral response: Appropriate    Intervention Topic: Leisure  Discussion/Intervention:   Group content today was focused on leisure. The group defined what leisure is and some positive leisure activities they participate in. Individuals identified the difference between good and bad leisure. Participants expressed how they feel after participating in the leisure of their choice. The group discussed how they go about picking a leisure activity and if others are involved in their leisure activities. The patient stated how many leisure activities they too choose from and reasons why it is important to have leisure time. Individuals participated in the intervention "Exploration of Leisure" where they had a chance to identify new leisure activities as well as benefits of leisure.  Clinical Observations/Feedback:  Patient came to group and stated leisure is what makes you happy. She stated she talks on her phone, attends group and watch movies when she has free time. Individual was social with peers and staff while in group.    LRT/CTRS            12/01/2017 12:33 PM

## 2017-12-01 NOTE — Progress Notes (Addendum)
[facility] MD Progress Note  12/01/2017 8:06 AM Teresa Jefferson  MRN:  [id number]  Subjective:   Patient is having a good day.  She reports improved mood.  Patient is medication compliant.  She is excited that she was accepted into the inpatient substance abuse rehab facility.  Social worker has been notified that she will be admitted to [facility] Wednesday, 7/24 at 9 AM.   Principal Problem: Severe episode of recurrent major depressive disorder, without psychotic features ([place]) Diagnosis:   Patient Active Problem List   Diagnosis Date Noted  . Essential hypertension [I10] 11/17/2017  . Asthma [J45.909] 11/17/2017  . 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[place]) [Y65.03, F32.89] 11/17/2017  . Cocaine use disorder, severe, dependence ([place]) [F14.20] 11/17/2017  . Opioid use disorder, severe, dependence ([place]) [F11.20] 11/17/2017  . Severe episode of recurrent major depressive disorder, without psychotic features ([place]) [F33.2] 11/16/2017  . Opiate abuse, continuous ([place]) [F11.10] 11/16/2017  . Cocaine abuse ([place]) [F14.10] 11/16/2017  . Suicidal ideation [R45.851] 11/16/2017  . Chronic pain [G89.29] 11/16/2017   Total Time spent with patient, discussing pt with treatment team,  reviewing chart, and providing supportive therapy.: 20 minutes  Past Psychiatric History: depression, substance abuse  Past Medical History:  Past Medical History:  Diagnosis Date  . Bipolar 1 disorder ([place])   . Depression   . Hypertension   . Neuropathy   . Schizophrenia ([place])   . Thyroid disease     Past Surgical History:  Procedure Laterality Date  . ABDOMINAL HYSTERECTOMY    . COLONOSCOPY N/A 11/23/2017   Procedure: COLONOSCOPY;  Surgeon: Lin Landsman, MD;  Location: [facility] ENDOSCOPY;  Service: Gastroenterology;  Laterality: N/A;  . COLONOSCOPY N/A 11/24/2017   Procedure: COLONOSCOPY;  Surgeon: Lin Landsman, MD;  Location: [facility] ENDOSCOPY;  Service: Gastroenterology;  Laterality: N/A;  .  KNEE SURGERY Right   . TONSILLECTOMY     Family History: History reviewed. No pertinent family history. Family Psychiatric  History: see H&P Social History:  Social History   Substance and Sexual Activity  Alcohol Use No   Comment: occ     Social History   Substance and Sexual Activity  Drug Use Yes   Comment: crack and opiates    Social History   Socioeconomic History  . Marital status: Legally Separated    Spouse name: Not on file  . Number of children: Not on file  . Years of education: Not on file  . Highest education level: Not on file  Occupational History  . Not on file  Social Needs  . Financial resource strain: Not on file  . Food insecurity:    Worry: Not on file    Inability: Not on file  . Transportation needs:    Medical: Not on file    Non-medical: Not on file  Tobacco Use  . Smoking status: Never Smoker  . Smokeless tobacco: Never Used  Substance and Sexual Activity  . Alcohol use: No    Comment: occ  . Drug use: Yes    Comment: crack and opiates  . Sexual activity: Not on file  Lifestyle  . Physical activity:    Days per week: Not on file    Minutes per session: Not on file  . Stress: Not on file  Relationships  . Social connections:    Talks on phone: Not on file    Gets together: Not on file    Attends religious service: Not on file    Active member  of club or organization: Not on file    Attends meetings of clubs or organizations: Not on file    Relationship status: Not on file  Other Topics Concern  . Not on file  Social History Narrative  . Not on file   Additional Social History: see H&P  Sleep: Good  Appetite:  Good  Current Medications: Current Facility-Administered Medications  Medication Dose Route Frequency Provider Last Rate Last Dose  . 0.9 %  sodium chloride infusion   Intravenous Continuous Lin Landsman, MD 20 mL/hr at 11/23/17 1321 1,000 mL at 11/23/17 1321  . 0.9 %  sodium chloride infusion   Intravenous  Continuous Vanga, Tally Due, MD      . acetaminophen (TYLENOL) tablet 650 mg  650 mg Oral Q6H PRN [name], [name], MD   650 mg at 11/30/17 2152  . albuterol (PROVENTIL HFA;VENTOLIN HFA) 108 (90 Base) MCG/ACT inhaler 1-2 puff  1-2 puff Inhalation Q6H PRN [name], MD      . alum & mag hydroxide-simeth (MAALOX/MYLANTA) 200-200-20 MG/5ML suspension 30 mL  30 mL Oral Q4H PRN [name], [name], MD      . amitriptyline (ELAVIL) tablet 25 mg  25 mg Oral QHS [name], MD   25 mg at 11/30/17 2121  . buprenorphine-naloxone (SUBOXONE) 8-2 mg per SL tablet 1 tablet  1 tablet Sublingual Daily [name], MD   1 tablet at 11/30/17 0813  . diphenhydrAMINE (BENADRYL) capsule 50 mg  50 mg Oral Q4H PRN [name], MD   50 mg at 11/30/17 2121  . gabapentin (NEURONTIN) capsule 400 mg  400 mg Oral TID PC [name], MD   400 mg at 11/30/17 2028  . hydrochlorothiazide (HYDRODIURIL) tablet 25 mg  25 mg Oral Daily [name], MD   25 mg at 11/30/17 0813  . hydrOXYzine (ATARAX/VISTARIL) tablet 50 mg  50 mg Oral TID PRN [name], MD   50 mg at 11/29/17 1426  . ibuprofen (ADVIL,MOTRIN) tablet 600 mg  600 mg Oral Q6H PRN [name], MD   600 mg at 12/01/17 0630  . lisinopril (PRINIVIL,ZESTRIL) tablet 5 mg  5 mg Oral Daily [name], MD   5 mg at 11/30/17 0814  . loratadine (CLARITIN) tablet 10 mg  10 mg Oral Daily Pucilowska, Jolanta B, MD   10 mg at 11/30/17 0813  . magnesium hydroxide (MILK OF MAGNESIA) suspension 30 mL  30 mL Oral Daily PRN [name], [name], MD   30 mL at 11/21/17 2128  . naphazoline-pheniramine (NAPHCON-A) 0.025-0.3 % ophthalmic solution 2 drop  2 drop Both Eyes QID PRN Pucilowska, Jolanta B, MD   2 drop at 11/29/17 2112  . neomycin-bacitracin-polymyxin (NEOSPORIN) ointment   Topical BID [name], MD   1 application at 82/50/03 1635  . ondansetron (ZOFRAN-ODT) disintegrating tablet 4 mg  4 mg Oral Q8H PRN [name], MD   4 mg at 12/01/17 0636  . polyethylene  glycol (MIRALAX / GLYCOLAX) packet 17 g  17 g Oral BID [name], MD   17 g at 11/30/17 0813  . QUEtiapine (SEROQUEL) tablet 400 mg  400 mg Oral QHS [name], [name], MD   400 mg at 11/30/17 2121  . triamcinolone ointment (KENALOG) 0.1 %   Topical TID PRN [name], MD        Lab Results:  No results found for this or any previous visit (from the past 48 hour(s)).  Blood Alcohol level:  Lab Results  Component  Value Date   ETH <10 11/16/2017   ETH <10 44/05/270    Metabolic Disorder Labs: Lab Results  Component Value Date   HGBA1C 6.1 (H) 11/16/2017   MPG 128.37 11/16/2017   No results found for: PROLACTIN Lab Results  Component Value Date   CHOL 164 11/16/2017   TRIG 117 11/16/2017   HDL 60 11/16/2017   CHOLHDL 2.7 11/16/2017   VLDL 23 11/16/2017   LDLCALC 81 11/16/2017    Physical Findings: AIMS: Facial and Oral Movements Muscles of Facial Expression: None, normal Lips and Perioral Area: None, normal Jaw: None, normal Tongue: None, normal,Extremity Movements Upper (arms, wrists, hands, fingers): None, normal Lower (legs, knees, ankles, toes): None, normal,  , Overall Severity Severity of abnormal movements (highest score from questions above): None, normal Incapacitation due to abnormal movements: None, normal Patient's awareness of abnormal movements (rate only patient's report): No Awareness, Dental Status Current problems with teeth and/or dentures?: No Does patient usually wear dentures?: No  CIWA:    COWS:     Musculoskeletal: Strength & Muscle Tone: within normal limits Gait & Station: normal Patient leans: N/A  Psychiatric Specialty Exam: Physical Exam  Nursing note and vitals reviewed. Constitutional: She appears well-developed and well-nourished.  HENT:  Head: Normocephalic.  Skin: Rash (rash decreasing with triamcinolone cream) noted.  Psychiatric: Her speech is normal. Judgment and thought content normal. Her mood appears not anxious.  Her affect is not angry. She is not agitated. Cognition and memory are normal. She does not exhibit a depressed mood.    Review of Systems  Constitutional: Negative for chills and fever.  Gastrointestinal: Negative for constipation.  Skin: Positive for rash (improving with triamcinolone cream).  Neurological: Negative.   Psychiatric/Behavioral: Negative for depression.  All other systems reviewed and are negative.   Blood pressure (!) 143/90, pulse 81, temperature 98 F (36.7 C), temperature source Oral, resp. rate 18, height 5\' 2"  (1.575 m), weight 78 kg (172 lb), SpO2 100 %.Body mass index is 31.46 kg/m.  General Appearance: Casual  Eye Contact:  Good  Speech:  Normal Rate  Volume:  Normal  Mood:  Euthymic  Affect:  Full Range  Thought Process:  Goal Directed and Descriptions of Associations: Intact  Orientation:  Full (Time, Place, and Person)  Thought Content:  WDL  Suicidal Thoughts:  No  Homicidal Thoughts:  No  Memory:  Immediate;   Fair Recent;   Fair Remote;   Good  Judgement:  Fair  Insight:  Fair  Psychomotor Activity:  Normal  Concentration:  Concentration: Fair and Attention Span: Fair  Recall:  [employer] of Knowledge:  Fair  Language:  Fair  Akathisia:  No  Handed:  Right  AIMS (if indicated):     Assets:  Communication Skills Desire for Improvement Physical Health Resilience  ADL's:  Intact  Cognition:  WNL  Sleep:  Number of Hours: 6.25     Treatment Plan Summary: Daily contact with patient to assess and evaluate symptoms and progress in treatment and Medication management   Ms. Higuchi is a 52 year old female with a history of depression, mood instability and substance abuse, on suboxone; accepted to [facility].  Patient will be admitted to the inpatient rehab facility 12/06/2017.  Supportive therapy provided.   Pt has rash on upper chest/lower neck, and a few spots on upper and lower bilateral extremities, that has improved with triamcinolone  ointment.  Lamotrigine discontinued and dermatology consulted on 11/28/17.  Per dermatology, rash is not [place]  Johnson's syndrome or DRESS.   Pt states she had a similar rash with the effexor XR in the past.     Major Depressive Disorder, recurrent, severe vs Substance-induced Depression; Anxiety disorder, unspecified. Mood improving slowly.  -Continue seroquel 400 mg nightly -Discontinue lamotrigine on 7/16, (lamotrigine 25 mg qd was started 11/17/17), but pt notified M.D. of rash on 7/16 -Elavil 25 mg nightly started 11/23/2017 -QTc 467  Rash -stopped lamotrigine  -consulted Dermatology -Benadryl 50 mg q 4 hrs prn itch -per dermatologist, hepatic panel and CBC with diff ordered and both Wnl Triamcinolone 0.1% ointment twice daily until improved to treat any underlying inflammation    Opioid use disorder, severe -suboxone 8-2 mg BID reduced to once a day on 11/20/17  Cocaine use disorder, severe with associated withdrawal symptoms -monitor BP -hydroxyzine 50 mg TID PRN anxiety  Hypertension, stable -HCTZ and lisinopril -monitor potassium  Chronic pain, neuropathic --Gabapentin increased from 300 mg to 400 mg TID for neuropathic pain in back and bilateral upper and lower extremities  Ear/neck pain -  per ENT, ear abx dropcompleted  Abdominal pain -H/H stable -KUB shows "scattered" stool, no impaction -Hemoccult is positive for blood. GI consulted and pt scheduled for colonoscopy on 7/12. -3 polyps removed, awaiting patholoy -continue miralax 17 g BID -f/u with GI in 4 weeks -repeat colonoscopy in 3 yrs  Disposition: residential substance abuse rehab Tentative discharge: Patient accepted to inpatient rehab facility ([facility]).  Patient will be admitted to the inpatient rehab facility 12/06/2017. Risks (including but not limited rash, allergic reaction, death, Steven-Johnson's syndrome) and benefits discussed with the patient, and pt voices understanding and  consents to the treatment plan.      [name], MD 12/01/2017, 8:06 AM

## 2017-12-02 NOTE — Progress Notes (Signed)
D: Patient stated slept good last night .Stated appetite is good and energy level  Is normal. Stated concentration is good . Stated on Depression scale 6 , hopeless 5 and anxiety 4 .( low 0-10 high) Denies suicidal  homicidal ideations  .  No auditory hallucinations  No pain concerns . Appropriate ADL'S. Interacting with peers and staff.  Voice of rash  Observed on right arm . , received itching medication later  MD informed . ( Dr.[name]) Improving with emotional and mental status . Voice of no safety concerns . Denies suicidal ideations.Attending unit programing . Interacting with peers and staffPatient understanding of information received from staff A: Encourage patient participation with unit programming . Instruction  Given on  Medication , verbalize understanding. R: Voice no other concerns. Staff continue to monitor

## 2017-12-02 NOTE — Progress Notes (Signed)
D: Pt denies SI/HI/AVH. Pt is pleasant and cooperative. Pt. Reports doing good this evening. Pt. Reports chronic back pain that is being controlled with PRN medications.  Patient Interaction appropriate with staff and peers. Pt. Socializes good and participates well in groups and unit activities.   A: Q x 15 minute observation checks were completed for safety. Patient was provided with education.  Patient was given scheduled/prn medications. Patient  was encourage to attend groups, participate in unit activities and continue with plan of care. Pt. Chart and plans of care reviewed. Pt. Given support and encouragement.   R: Patient is complaint with medication and unit procedures.             Precautionary checks every 15 minutes for safety maintained, room free of safety hazards, patient sustains no injury or falls during this shift. Will endorse care to next shift.

## 2017-12-02 NOTE — Plan of Care (Signed)
Improving with emotional and mental status . Voice of no safety concerns . Denies suicidal ideations. Attending unit programing . Interacting  with  peers and staff Patient understanding of information received from staff        Problem: Education: Goal: Knowledge of [facility] Education information/materials will improve Outcome: Progressing Goal: Emotional status will improve Outcome: Progressing Goal: Mental status will improve Outcome: Progressing Goal: Verbalization of understanding the information provided will improve Outcome: Progressing   Problem: Safety: Goal: Periods of time without injury will increase Outcome: Progressing   Problem: Safety: Goal: Ability to disclose and discuss suicidal ideas will improve Outcome: Progressing   Problem: Safety: Goal: Ability to remain free from injury will improve Outcome: Progressing

## 2017-12-02 NOTE — Progress Notes (Signed)
[facility] MD Progress Note  12/02/2017 1:46 PM MAXCINE STRONG  MRN:  [id number] Subjective: Follow-up note for this patient with severe depression and substance abuse problems.  Patient is complaining of a slight rash on her right arm.  She says that it just started yesterday even though she stopped the Lamictal a while ago.  The rash does not look very severe and is not causing extreme itching.  Otherwise her mood is good.  Denies suicidal thoughts.  Says she is looking forward to going to substance abuse treatment this week. Principal Problem: Severe episode of recurrent major depressive disorder, without psychotic features ([place]) Diagnosis:   Patient Active Problem List   Diagnosis Date Noted  . Essential hypertension [I10] 11/17/2017  . Asthma [J45.909] 11/17/2017  . 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 Cocaine use disorder, severe, dependence ([place]) [F14.20] 11/17/2017  . Opioid use disorder, severe, dependence ([place]) [F11.20] 11/17/2017  . Severe episode of recurrent major depressive disorder, without psychotic features ([place]) [F33.2] 11/16/2017  . Opiate abuse, continuous ([place]) [F11.10] 11/16/2017  . Cocaine abuse ([place]) [F14.10] 11/16/2017  . Suicidal ideation [R45.851] 11/16/2017  . Chronic pain [G89.29] 11/16/2017   Total Time spent with patient: 30 minutes  Past Psychiatric History: History of depression and substance abuse.  Past Medical History:  Past Medical History:  Diagnosis Date  . Bipolar 1 disorder ([place])   . Depression   . Hypertension   . Neuropathy   . Schizophrenia ([place])   . Thyroid disease     Past Surgical History:  Procedure Laterality Date  . ABDOMINAL HYSTERECTOMY    . COLONOSCOPY N/A 11/23/2017   Procedure: COLONOSCOPY;  Surgeon: Lin Landsman, MD;  Location: [facility] ENDOSCOPY;  Service: Gastroenterology;  Laterality: N/A;  . COLONOSCOPY N/A 11/24/2017   Procedure: COLONOSCOPY;  Surgeon: Lin Landsman, MD;  Location: [facility] ENDOSCOPY;  Service: Gastroenterology;  Laterality: N/A;  . KNEE SURGERY Right   . TONSILLECTOMY     Family History: History reviewed. No pertinent family history. Family Psychiatric  History: See previous note Social History:  Social History   Substance and Sexual Activity  Alcohol Use No   Comment: occ     Social History   Substance and Sexual Activity  Drug Use Yes   Comment: crack and opiates    Social History   Socioeconomic History  . Marital status: Legally Separated    Spouse name: Not on file  . Number of children: Not on file  . Years of education: Not on file  . Highest education level: Not on file  Occupational History  . Not on file  Social Needs  . Financial resource strain: Not on file  . Food insecurity:    Worry: Not on file    Inability: Not on file  . Transportation needs:    Medical: Not on file    Non-medical: Not on file  Tobacco Use  . Smoking status: Never Smoker  . Smokeless tobacco: Never Used  Substance and Sexual Activity  . Alcohol use: No    Comment: occ  . Drug use: Yes    Comment: crack and opiates  . Sexual activity: Not on file  Lifestyle  . Physical activity:    Days per week: Not on file    Minutes per session: Not on file  . Stress: Not on file  Relationships  . Social connections:    Talks on phone: Not on file    Gets together:  Not on file    Attends religious service: Not on file    Active member of club or organization: Not on file    Attends meetings of clubs or organizations: Not on file    Relationship status: Not on file  Other Topics Concern  . Not on file  Social History Narrative  . Not on file   Additional Social History:                         Sleep: Good  Appetite:  Good  Current Medications: Current Facility-Administered Medications  Medication Dose Route Frequency Provider Last Rate Last Dose  . 0.9 %  sodium chloride infusion   Intravenous Continuous  Lin Landsman, MD 20 mL/hr at 11/23/17 1321 1,000 mL at 11/23/17 1321  . 0.9 %  sodium chloride infusion   Intravenous Continuous [place], Tally Due, MD      . acetaminophen (TYLENOL) tablet 650 mg  650 mg Oral Q6H PRN [name], [name], MD   650 mg at 11/30/17 2152  . albuterol (PROVENTIL HFA;VENTOLIN HFA) 108 (90 Base) MCG/ACT inhaler 1-2 puff  1-2 puff Inhalation Q6H PRN [name], MD      . alum & mag hydroxide-simeth (MAALOX/MYLANTA) 200-200-20 MG/5ML suspension 30 mL  30 mL Oral Q4H PRN [name], [name], MD   30 mL at 12/01/17 1026  . amitriptyline (ELAVIL) tablet 25 mg  25 mg Oral QHS [name], MD   25 mg at 12/01/17 2118  . buprenorphine-naloxone (SUBOXONE) 8-2 mg per SL tablet 1 tablet  1 tablet Sublingual Daily [name], MD   1 tablet at 12/02/17 0810  . diphenhydrAMINE (BENADRYL) capsule 50 mg  50 mg Oral Q4H PRN [name], MD   50 mg at 12/02/17 0902  . gabapentin (NEURONTIN) capsule 400 mg  400 mg Oral TID PC O'Neal, Sarita, MD   400 mg at 12/02/17 1343  . hydrochlorothiazide (HYDRODIURIL) tablet 25 mg  25 mg Oral Daily [name], MD   25 mg at 12/02/17 0810  . hydrOXYzine (ATARAX/VISTARIL) tablet 50 mg  50 mg Oral TID PRN [name], MD   50 mg at 11/29/17 1426  . ibuprofen (ADVIL,MOTRIN) tablet 600 mg  600 mg Oral Q6H PRN [name], MD   600 mg at 12/02/17 0814  . lisinopril (PRINIVIL,ZESTRIL) tablet 5 mg  5 mg Oral Daily [name], MD   5 mg at 12/02/17 0810  . loratadine (CLARITIN) tablet 10 mg  10 mg Oral Daily Pucilowska, Jolanta B, MD   10 mg at 12/02/17 0810  . magnesium hydroxide (MILK OF MAGNESIA) suspension 30 mL  30 mL Oral Daily PRN [name], [name], MD   30 mL at 12/01/17 1834  . naphazoline-pheniramine (NAPHCON-A) 0.025-0.3 % ophthalmic solution 2 drop  2 drop Both Eyes QID PRN Pucilowska, Jolanta B, MD   2 drop at 11/29/17 2112  . neomycin-bacitracin-polymyxin (NEOSPORIN) ointment   Topical BID [name], MD   1 application at  16/60/63 1635  . ondansetron (ZOFRAN-ODT) disintegrating tablet 4 mg  4 mg Oral Q8H PRN [name], MD   4 mg at 12/01/17 0636  . polyethylene glycol (MIRALAX / GLYCOLAX) packet 17 g  17 g Oral BID [name], MD   17 g at 12/02/17 0810  . QUEtiapine (SEROQUEL) tablet 400 mg  400 mg Oral QHS [name], [name], MD   400 mg at 12/01/17 2118  . triamcinolone ointment (KENALOG) 0.1 %  Topical TID PRN [name], MD        Lab Results: No results found for this or any previous visit (from the past 48 hour(s)).  Blood Alcohol level:  Lab Results  Component Value Date   ETH <10 11/16/2017   ETH <10 56/43/3295    Metabolic Disorder Labs: Lab Results  Component Value Date   HGBA1C 6.1 (H) 11/16/2017   MPG 128.37 11/16/2017   No results found for: PROLACTIN Lab Results  Component Value Date   CHOL 164 11/16/2017   TRIG 117 11/16/2017   HDL 60 11/16/2017   CHOLHDL 2.7 11/16/2017   VLDL 23 11/16/2017   LDLCALC 81 11/16/2017    Physical Findings: AIMS: Facial and Oral Movements Muscles of Facial Expression: None, normal Lips and Perioral Area: None, normal Jaw: None, normal Tongue: None, normal,Extremity Movements Upper (arms, wrists, hands, fingers): None, normal Lower (legs, knees, ankles, toes): None, normal,  , Overall Severity Severity of abnormal movements (highest score from questions above): None, normal Incapacitation due to abnormal movements: None, normal Patient's awareness of abnormal movements (rate only patient's report): No Awareness, Dental Status Current problems with teeth and/or dentures?: No Does patient usually wear dentures?: No  CIWA:    COWS:     Musculoskeletal: Strength & Muscle Tone: within normal limits Gait & Station: normal Patient leans: N/A  Psychiatric Specialty Exam: Physical Exam  Nursing note and vitals reviewed. Constitutional: She appears well-developed and well-nourished.  HENT:  Head: Normocephalic and atraumatic.   Eyes: Pupils are equal, round, and reactive to light. Conjunctivae are normal.  Neck: Normal range of motion.  Cardiovascular: Regular rhythm and normal heart sounds.  Respiratory: Effort normal. No respiratory distress.  GI: Soft.  Musculoskeletal: Normal range of motion.  Neurological: She is alert.  Skin: Skin is warm and dry.  Psychiatric: She has a normal mood and affect. Her speech is normal and behavior is normal. Judgment and thought content normal. Her affect is not blunt. Cognition and memory are normal.    Review of Systems  Constitutional: Negative.   HENT: Negative.   Eyes: Negative.   Respiratory: Negative.   Cardiovascular: Negative.   Gastrointestinal: Negative.   Musculoskeletal: Negative.   Skin: Positive for rash.  Neurological: Negative.   Psychiatric/Behavioral: Negative.     Blood pressure (!) 140/93, pulse 99, temperature 98.1 F (36.7 C), temperature source Oral, resp. rate 18, height 5\' 2"  (1.575 m), weight 78 kg (172 lb), SpO2 97 %.Body mass index is 31.46 kg/m.  General Appearance: Casual  Eye Contact:  Good  Speech:  Clear and Coherent  Volume:  Normal  Mood:  Euthymic  Affect:  Appropriate  Thought Process:  Goal Directed  Orientation:  Full (Time, Place, and Person)  Thought Content:  Logical  Suicidal Thoughts:  No  Homicidal Thoughts:  No  Memory:  Immediate;   Fair Recent;   Fair Remote;   Fair  Judgement:  Fair  Insight:  Fair  Psychomotor Activity:  Normal  Concentration:  Concentration: Fair  Recall:  [employer] of Knowledge:  Fair  Language:  Fair  Akathisia:  No  Handed:  Right  AIMS (if indicated):     Assets:  Communication Skills Desire for Improvement Resilience  ADL's:  Intact  Cognition:  WNL  Sleep:  Number of Hours: 6.45     Treatment Plan Summary: Plan Patient had her rash examined.  I do not think it is likely to be due to the lamotrigine and  even if it is it should get better and she already has appropriate  therapy in place.  No change to medicine.  Supportive counseling and encouragement to keep up her determination to engage in substance abuse counseling.  Alethia Berthold, MD 12/02/2017, 1:46 PM

## 2017-12-02 NOTE — Plan of Care (Signed)
Pt. Verbalizes understanding of provided education. Pt. This evening reports doing good. Pt. Denies SI/HI. Pt. Verbally is able to contract for safety. Pt. Monitored on the unit for safety.    Problem: Education: Goal: Knowledge of [place] [facility] Education information/materials will improve Outcome: Progressing Goal: Emotional status will improve Outcome: Progressing Goal: Mental status will improve Outcome: Progressing   Problem: Safety: Goal: Periods of time without injury will increase Outcome: Progressing   Problem: Safety: Goal: Ability to disclose and discuss suicidal ideas will improve Outcome: Progressing   Problem: Safety: Goal: Ability to remain free from injury will improve Outcome: Progressing

## 2017-12-02 NOTE — [facility] Group Notes (Signed)
LCSW Group Therapy Note  12/02/2017 1:15pm  Type of Therapy and Topic: Group Therapy: Holding on to Grudges   Participation Level: Active   Description of Group:  In this group patients will be asked to explore and define a grudge. Patients will be guided to discuss their thoughts, feelings, and reasons as to why people have grudges. Patients will process the impact grudges have on daily life and identify thoughts and feelings related to holding grudges. Facilitator will challenge patients to identify ways to let go of grudges and the benefits this provides. Patients will be confronted to address why one struggles letting go of grudges. Lastly, patients will identify feelings and thoughts related to what life would look like without grudges. This group will be process-oriented, with patients participating in exploration of their own experiences, giving and receiving support, and processing challenge from other group members.  Therapeutic Goals:  1. Patient will identify specific grudges related to their personal life.  2. Patient will identify feelings, thoughts, and beliefs around grudges.  3. Patient will identify how one releases grudges appropriately.  4. Patient will identify situations where they could have let go of the grudge, but instead chose to hold on.   Summary of Patient Progress: The patient scored her mood at an 8 (10 best). Patients were guided to discuss their thoughts, feelings, and reasons as to why people have grudges. The patient was able to process the impact grudges have on daily life and identified thoughts and feelings related to holding grudges. The patient was challenged to identify ways to let go of grudges and the benefits this provides. Pt. actively and appropriately engaged in the group. Patient was able to provide support and validation to other group members.    Therapeutic Modalities:  Cognitive Behavioral Therapy  Solution Focused Therapy  Motivational  Interviewing  Brief Therapy     CUEBAS-COLON, LCSW 12/02/2017 10:19 AM

## 2017-12-03 NOTE — Plan of Care (Signed)
Aware of leaving  and going to [facility] this week. Improving with emotional and mental status . Voice of no safety concerns . Denies suicidal ideations. Attending unit programing . Interacting  with  peers and staff Patient understanding of information received from staff     Problem: Education: Goal: Knowledge of [facility] Education information/materials will improve Outcome: Progressing Goal: Emotional status will improve Outcome: Progressing Goal: Mental status will improve Outcome: Progressing Goal: Verbalization of understanding the information provided will improve Outcome: Progressing   Problem: Safety: Goal: Periods of time without injury will increase Outcome: Progressing   Problem: Safety: Goal: Ability to disclose and discuss suicidal ideas will improve Outcome: Progressing   Problem: Safety: Goal: Ability to remain free from injury will improve Outcome: Progressing

## 2017-12-03 NOTE — Progress Notes (Signed)
D: Patient stated slept good last night .Stated appetite is good and energy level  Is normal. Stated concentration is good . Stated on Depression scale 4, hopeless 3 and anxiety 3 .( low 0-10 high) Denies suicidal  homicidal ideations  .  No auditory hallucinations  No pain concerns . Appropriate ADL'S. Interacting with peers and staff. Aware of leaving  and going to [facility] this week. Improving with emotional and mental status . Voice of no safety concerns . Denies suicidal ideations. Attending unit programing . Interacting  with  peers and staff Patient understanding of information received from staff . aware of  Discharge this week .  A: Encourage patient participation with unit programming . Instruction  Given on  Medication , verbalize understanding. R: Voice no other concerns. Staff continue to monitor

## 2017-12-03 NOTE — [facility] Group Notes (Signed)
[place] Group Notes:  (Nursing/MHT/Case Management/Adjunct)  Date:  12/03/2017  Time:  10:00 PM  Type of Therapy:  Group Therapy  Participation Level:  Active  Participation Quality:  Appropriate  Affect:  Appropriate  Cognitive:  Appropriate  Insight:  Appropriate  Engagement in Group:  Developing/Improving, Engaged and Supportive  Modes of Intervention:  Support  Summary of Progress/Problems:     12/03/2017, 10:00 PM

## 2017-12-03 NOTE — [facility] Group Notes (Signed)
LCSW Group Therapy Note 12/03/2017 1:15pm  Type of Therapy and Topic: Group Therapy: Feelings Around Returning Home & Establishing a Supportive Framework and Supporting Oneself When Supports Not Available  Participation Level: Active  Description of Group:  Patients first processed thoughts and feelings about upcoming discharge. These included fears of upcoming changes, lack of change, new living environments, judgements and expectations from others and overall stigma of mental health issues. The group then discussed the definition of a supportive framework, what that looks and feels like, and how do to discern it from an unhealthy non-supportive network. The group identified different types of supports as well as what to do when your family/friends are less than helpful or unavailable  Therapeutic Goals  1. Patient will identify one healthy supportive network that they can use at discharge. 2. Patient will identify one factor of a supportive framework and how to tell it from an unhealthy network. 3. Patient able to identify one coping skill to use when they do not have positive supports from others. 4. Patient will demonstrate ability to communicate their needs through discussion and/or role plays.  Summary of Patient Progress:  Patient reported she is "alright." Pt engaged during group session. As patients processed their anxiety about discharge and described healthy supports patient shared she is "kind of ready" to be discharge. She stated that she is going to [facility] on Wednesday. Patient listed her children and grandchildren as her main support system. Patients identified at least one self-care tool they were willing to use after discharge.   Therapeutic Modalities Cognitive Behavioral Therapy Motivational Interviewing     CUEBAS-COLON, LCSW 12/03/2017 10:09 AM

## 2017-12-03 NOTE — Progress Notes (Signed)
[facility] MD Progress Note  12/03/2017 12:00 PM Teresa Jefferson  MRN:  [id number] Subjective: Follow-up patient with depression and substance abuse.  Patient has no complaints.  She says that her mood is feeling good.  She is no longer complaining of the rash.  Patient is looking forward to going to substance abuse treatment later this week.  She interacts with staff and peers appropriately.  Seems to be in a good mood Principal Problem: Severe episode of recurrent major depressive disorder, without psychotic features ([place]) Diagnosis:   Patient Active Problem List   Diagnosis Date Noted  . Essential hypertension [I10] 11/17/2017  . Asthma [J45.909] 11/17/2017  . 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[place]) [O27.03, F32.89] 11/17/2017  . Cocaine use disorder, severe, dependence ([place]) [F14.20] 11/17/2017  . Opioid use disorder, severe, dependence ([place]) [F11.20] 11/17/2017  . Severe episode of recurrent major depressive disorder, without psychotic features ([place]) [F33.2] 11/16/2017  . Opiate abuse, continuous ([place]) [F11.10] 11/16/2017  . Cocaine abuse ([place]) [F14.10] 11/16/2017  . Suicidal ideation [R45.851] 11/16/2017  . Chronic pain [G89.29] 11/16/2017   Total Time spent with patient: 20 minutes  Past Psychiatric History: Past history of depression and also recurrent substance use problems  Past Medical History:  Past Medical History:  Diagnosis Date  . Bipolar 1 disorder ([place])   . Depression   . Hypertension   . Neuropathy   . Schizophrenia ([place])   . Thyroid disease     Past Surgical History:  Procedure Laterality Date  . ABDOMINAL HYSTERECTOMY    . COLONOSCOPY N/A 11/23/2017   Procedure: COLONOSCOPY;  Surgeon: Lin Landsman, MD;  Location: [facility] ENDOSCOPY;  Service: Gastroenterology;  Laterality: N/A;  . COLONOSCOPY N/A 11/24/2017   Procedure: COLONOSCOPY;  Surgeon: Lin Landsman, MD;  Location:  [facility] ENDOSCOPY;  Service: Gastroenterology;  Laterality: N/A;  .  KNEE SURGERY Right   . TONSILLECTOMY     Family History: History reviewed. No pertinent family history. Family Psychiatric  History: See previous note Social History:  Social History   Substance and Sexual Activity  Alcohol Use No   Comment: occ     Social History   Substance and Sexual Activity  Drug Use Yes   Comment: crack and opiates    Social History   Socioeconomic History  . Marital status: Legally Separated    Spouse name: Not on file  . Number of children: Not on file  . Years of education: Not on file  . Highest education level: Not on file  Occupational History  . Not on file  Social Needs  . Financial resource strain: Not on file  . Food insecurity:    Worry: Not on file    Inability: Not on file  . Transportation needs:    Medical: Not on file    Non-medical: Not on file  Tobacco Use  . Smoking status: Never Smoker  . Smokeless tobacco: Never Used  Substance and Sexual Activity  . Alcohol use: No    Comment: occ  . Drug use: Yes    Comment: crack and opiates  . Sexual activity: Not on file  Lifestyle  . Physical activity:    Days per week: Not on file    Minutes per session: Not on file  . Stress: Not on file  Relationships  . Social connections:    Talks on phone: Not on file    Gets together: Not on file    Attends religious service: Not on file  Active member of club or organization: Not on file    Attends meetings of clubs or organizations: Not on file    Relationship status: Not on file  Other Topics Concern  . Not on file  Social History Narrative  . Not on file   Additional Social History:                         Sleep: Good  Appetite:  Good  Current Medications: Current Facility-Administered Medications  Medication Dose Route Frequency Provider Last Rate Last Dose  . 0.9 %  sodium chloride infusion   Intravenous Continuous Lin Landsman, MD 20 mL/hr at 11/23/17 1321 1,000 mL at 11/23/17 1321  . 0.9 %   sodium chloride infusion   Intravenous Continuous Vanga, Tally Due, MD      . acetaminophen (TYLENOL) tablet 650 mg  650 mg Oral Q6H PRN , [name], MD   650 mg at 12/02/17 2202  . albuterol (PROVENTIL HFA;VENTOLIN HFA) 108 (90 Base) MCG/ACT inhaler 1-2 puff  1-2 puff Inhalation Q6H PRN [name], MD      . alum & mag hydroxide-simeth (MAALOX/MYLANTA) 200-200-20 MG/5ML suspension 30 mL  30 mL Oral Q4H PRN ,  T, MD   30 mL at 12/01/17 1026  . amitriptyline (ELAVIL) tablet 25 mg  25 mg Oral QHS [name], MD   25 mg at 12/02/17 2117  . buprenorphine-naloxone (SUBOXONE) 8-2 mg per SL tablet 1 tablet  1 tablet Sublingual Daily [name], MD   1 tablet at 12/03/17 0907  . diphenhydrAMINE (BENADRYL) capsule 50 mg  50 mg Oral Q4H PRN [name], MD   50 mg at 12/02/17 0902  . gabapentin (NEURONTIN) capsule 400 mg  400 mg Oral TID PC [name], MD   400 mg at 12/03/17 0907  . hydrochlorothiazide (HYDRODIURIL) tablet 25 mg  25 mg Oral Daily [name], MD   25 mg at 12/03/17 0907  . hydrOXYzine (ATARAX/VISTARIL) tablet 50 mg  50 mg Oral TID PRN [name], MD   50 mg at 11/29/17 1426  . ibuprofen (ADVIL,MOTRIN) tablet 600 mg  600 mg Oral Q6H PRN [name], MD   600 mg at 12/02/17 2120  . lisinopril (PRINIVIL,ZESTRIL) tablet 5 mg  5 mg Oral Daily [name], MD   5 mg at 12/03/17 0908  . loratadine (CLARITIN) tablet 10 mg  10 mg Oral Daily Pucilowska, Jolanta B, MD   10 mg at 12/03/17 0908  . magnesium hydroxide (MILK OF MAGNESIA) suspension 30 mL  30 mL Oral Daily PRN , [name], MD   30 mL at 12/01/17 1834  . naphazoline-pheniramine (NAPHCON-A) 0.025-0.3 % ophthalmic solution 2 drop  2 drop Both Eyes QID PRN Pucilowska, Jolanta B, MD   2 drop at 11/29/17 2112  . neomycin-bacitracin-polymyxin (NEOSPORIN) ointment   Topical BID [name], MD   1 application at 66/44/03 1635  . ondansetron (ZOFRAN-ODT) disintegrating tablet 4 mg  4 mg Oral Q8H PRN  [name], MD   4 mg at 12/01/17 0636  . polyethylene glycol (MIRALAX / GLYCOLAX) packet 17 g  17 g Oral BID [name], MD   17 g at 12/03/17 0907  . QUEtiapine (SEROQUEL) tablet 400 mg  400 mg Oral QHS , [name], MD   400 mg at 12/02/17 2117  . triamcinolone ointment (KENALOG) 0.1 %   Topical TID PRN [name], MD  Lab Results: No results found for this or any previous visit (from the past 48 hour(s)).  Blood Alcohol level:  Lab Results  Component Value Date   ETH <10 11/16/2017   ETH <10 18/84/1660    Metabolic Disorder Labs: Lab Results  Component Value Date   HGBA1C 6.1 (H) 11/16/2017   MPG 128.37 11/16/2017   No results found for: PROLACTIN Lab Results  Component Value Date   CHOL 164 11/16/2017   TRIG 117 11/16/2017   HDL 60 11/16/2017   CHOLHDL 2.7 11/16/2017   VLDL 23 11/16/2017   LDLCALC 81 11/16/2017    Physical Findings: AIMS: Facial and Oral Movements Muscles of Facial Expression: None, normal Lips and Perioral Area: None, normal Jaw: None, normal Tongue: None, normal,Extremity Movements Upper (arms, wrists, hands, fingers): None, normal Lower (legs, knees, ankles, toes): None, normal,  , Overall Severity Severity of abnormal movements (highest score from questions above): None, normal Incapacitation due to abnormal movements: None, normal Patient's awareness of abnormal movements (rate only patient's report): No Awareness, Dental Status Current problems with teeth and/or dentures?: No Does patient usually wear dentures?: No  CIWA:    COWS:     Musculoskeletal: Strength & Muscle Tone: within normal limits Gait & Station: normal Patient leans: N/A  Psychiatric Specialty Exam: Physical Exam  Nursing note and vitals reviewed. Constitutional: She appears well-developed and well-nourished.  HENT:  Head: Normocephalic and atraumatic.  Eyes: Pupils are equal, round, and reactive to light. Conjunctivae are normal.  Neck:  Normal range of motion.  Cardiovascular: Regular rhythm and normal heart sounds.  Respiratory: Effort normal. No respiratory distress.  GI: Soft.  Musculoskeletal: Normal range of motion.  Neurological: She is alert.  Skin: Skin is warm and dry.  Psychiatric: She has a normal mood and affect. Her behavior is normal. Judgment and thought content normal.    Review of Systems  Constitutional: Negative.   HENT: Negative.   Eyes: Negative.   Respiratory: Negative.   Cardiovascular: Negative.   Gastrointestinal: Negative.   Musculoskeletal: Negative.   Skin: Negative.   Neurological: Negative.   Psychiatric/Behavioral: Negative.     Blood pressure 118/80, pulse 91, temperature 98 F (36.7 C), temperature source Oral, resp. rate 18, height 5\' 2"  (1.575 m), weight 78 kg (172 lb), SpO2 100 %.Body mass index is 31.46 kg/m.  General Appearance: Casual  Eye Contact:  Good  Speech:  Clear and Coherent  Volume:  Normal  Mood:  Euthymic  Affect:  Congruent  Thought Process:  Goal Directed  Orientation:  Full (Time, Place, and Person)  Thought Content:  Logical  Suicidal Thoughts:  No  Homicidal Thoughts:  No  Memory:  Immediate;   Fair Recent;   Fair Remote;   Fair  Judgement:  Fair  Insight:  Fair  Psychomotor Activity:  Normal  Concentration:  Concentration: Fair  Recall:  [employer] of Knowledge:  Fair  Language:  Fair  Akathisia:  No  Handed:  Right  AIMS (if indicated):     Assets:  Desire for Improvement Housing Physical Health Resilience  ADL's:  Intact  Cognition:  WNL  Sleep:  Number of Hours: 6.3     Treatment Plan Summary: Daily contact with patient to assess and evaluate symptoms and progress in treatment, Medication management and Plan Review of medication.  Review overall treatment plan.  Patient is physically stable with no new complaints.  Encourage her continued focus on recovery and going to substance abuse treatment.  Jenny Reichmann  , MD 12/03/2017, 12:00  PM

## 2017-12-03 NOTE — Plan of Care (Addendum)
Patient found in common area upon my arrival. Patient is visible and social this evening. Patient mood and affect are bright and cheerful. Denies SI/HI/AVH. Complains of back pain 6/10, given Motrin. Will monitor for efficacy. Reports eating and voiding adequately. Compliant with HS medications and staff direction. Q 15 minute checks maintained. Will continue to monitor throughout the shift. @2225 , patient awake reports anxiety. Reports that she was in the bathroom and heard someone speaking into her ear. Patient may have heard people talking in the office behind her room. Provided emotional support and reassurance. Given Vistaril with positive results. Slept 6.75 hours. No apparent distress. Will endorse care to oncoming shift. Complains of right arm pain due to "sleeping wrong," given Motrin.   Problem: Education: Goal: Knowledge of [place] [facility] Education information/materials will improve Outcome: Progressing Goal: Emotional status will improve Outcome: Progressing Goal: Mental status will improve Outcome: Progressing Goal: Verbalization of understanding the information provided will improve Outcome: Progressing   Problem: Safety: Goal: Periods of time without injury will increase Outcome: Progressing   Problem: Safety: Goal: Ability to disclose and discuss suicidal ideas will improve Outcome: Progressing   Problem: Safety: Goal: Ability to remain free from injury will improve Outcome: Progressing

## 2017-12-04 DIAGNOSIS — F1420 Cocaine dependence, uncomplicated: Secondary | ICD-10-CM

## 2017-12-04 DIAGNOSIS — F3289 Other specified depressive episodes: Secondary | ICD-10-CM

## 2017-12-04 DIAGNOSIS — F1994 Other psychoactive substance use, unspecified with psychoactive substance-induced mood disorder: Secondary | ICD-10-CM

## 2017-12-04 DIAGNOSIS — F1120 Opioid dependence, uncomplicated: Secondary | ICD-10-CM

## 2017-12-04 MED ORDER — QUETIAPINE FUMARATE ER 200 MG PO TB24
400.0000 mg | ORAL_TABLET | Freq: Every day | ORAL | Status: DC
  Administered 2017-12-04: 400 mg via ORAL
  Filled 2017-12-04 (×2): qty 2

## 2017-12-04 NOTE — [facility] Group Notes (Signed)
[facility] Group Notes:  (Nursing/MHT/Case Management/Adjunct)  Date:  12/04/2017  Time:  11:04 PM  Type of Therapy:  Group Therapy  Participation Level:  Active  Participation Quality:  Appropriate  Affect:  Appropriate  Cognitive:  Appropriate  Insight:  Appropriate  Engagement in Group:  Engaged  Modes of Intervention:  Discussion  Summary of Progress/Problems:  Teresa Jefferson 12/04/2017, 11:04 PM

## 2017-12-04 NOTE — Progress Notes (Addendum)
[facility] MD Progress Note  12/04/2017 4:29 PM Teresa Jefferson  MRN:  [id number]  Subjective:   Patient continues to be excited about discharge to [facility] on Wednesday.  She reported that she was hearing someone say "Wait til I get off work" over the weekend and knocking noises from her vent in her room.  Per staff patient may have overheard staff talking.   Pt denies active AH or VH today.  Patient also confirmed with the owner of a a local boarding room that he will hold a room for her until the first of Aug.  The boarding room is at [address] [place], [place].   Principal Problem: Severe episode of recurrent major depressive disorder, without psychotic features ([place]) Diagnosis:   Patient Active Problem List   Diagnosis Date Noted  . Essential hypertension [I10] 11/17/2017  . Asthma [J45.909] 11/17/2017  . 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[place]) [K87.68, F32.89] 11/17/2017  . Cocaine use disorder, severe, dependence ([place]) [F14.20] 11/17/2017  . Opioid use disorder, severe, dependence ([place]) [F11.20] 11/17/2017  . Severe episode of recurrent major depressive disorder, without psychotic features ([place]) [F33.2] 11/16/2017  . Opiate abuse, continuous ([place]) [F11.10] 11/16/2017  . Cocaine abuse ([place]) [F14.10] 11/16/2017  . Suicidal ideation [R45.851] 11/16/2017  . Chronic pain [G89.29] 11/16/2017   Total Time spent with patient, discussing pt with treatment team,  reviewing chart, and providing supportive therapy.: 35 min  Past Psychiatric History: depression, substance abuse  Past Medical History:  Past Medical History:  Diagnosis Date  . Bipolar 1 disorder ([place])   . Depression   . Hypertension   . Neuropathy   . Schizophrenia ([place])   . Thyroid disease     Past Surgical History:  Procedure Laterality Date  . ABDOMINAL HYSTERECTOMY    . COLONOSCOPY N/A 11/23/2017   Procedure: COLONOSCOPY;  Surgeon: Lin Landsman, MD;  Location[facility] ENDOSCOPY;  Service:  Gastroenterology;  Laterality: N/A;  . COLONOSCOPY N/A 11/24/2017   Procedure: COLONOSCOPY;  Surgeon: Lin Landsman, MD;  Location: [facility] ENDOSCOPY;  Service: Gastroenterology;  Laterality: N/A;  . KNEE SURGERY Right   . TONSILLECTOMY     Family History: History reviewed. No pertinent family history. Family Psychiatric  History: see H&P Social History:  Social History   Substance and Sexual Activity  Alcohol Use No   Comment: occ     Social History   Substance and Sexual Activity  Drug Use Yes   Comment: crack and opiates    Social History   Socioeconomic History  . Marital status: Legally Separated    Spouse name: Not on file  . Number of children: Not on file  . Years of education: Not on file  . Highest education level: Not on file  Occupational History  . Not on file  Social Needs  . Financial resource strain: Not on file  . Food insecurity:    Worry: Not on file    Inability: Not on file  . Transportation needs:    Medical: Not on file    Non-medical: Not on file  Tobacco Use  . Smoking status: Never Smoker  . Smokeless tobacco: Never Used  Substance and Sexual Activity  . Alcohol use: No    Comment: occ  . Drug use: Yes    Comment: crack and opiates  . Sexual activity: Not on file  Lifestyle  . Physical activity:    Days per week: Not on file    Minutes per session: Not on  file  . Stress: Not on file  Relationships  . Social connections:    Talks on phone: Not on file    Gets together: Not on file    Attends religious service: Not on file    Active member of club or organization: Not on file    Attends meetings of clubs or organizations: Not on file    Relationship status: Not on file  Other Topics Concern  . Not on file  Social History Narrative  . Not on file   Additional Social History: see H&P  Sleep: Good  Appetite:  Good  Current Medications: Current Facility-Administered Medications  Medication Dose Route Frequency Provider  Last Rate Last Dose  . 0.9 %  sodium chloride infusion   Intravenous Continuous Lin Landsman, MD 20 mL/hr at 11/23/17 1321 1,000 mL at 11/23/17 1321  . 0.9 %  sodium chloride infusion   Intravenous Continuous Vanga, Tally Due, MD      . acetaminophen (TYLENOL) tablet 650 mg  650 mg Oral Q6H PRN [name], [name], MD   650 mg at 12/02/17 2202  . albuterol (PROVENTIL HFA;VENTOLIN HFA) 108 (90 Base) MCG/ACT inhaler 1-2 puff  1-2 puff Inhalation Q6H PRN [name], MD      . alum & mag hydroxide-simeth (MAALOX/MYLANTA) 200-200-20 MG/5ML suspension 30 mL  30 mL Oral Q4H PRN [name], [name], MD   30 mL at 12/01/17 1026  . amitriptyline (ELAVIL) tablet 25 mg  25 mg Oral QHS [name], MD   25 mg at 12/03/17 2110  . buprenorphine-naloxone (SUBOXONE) 8-2 mg per SL tablet 1 tablet  1 tablet Sublingual Daily [name], MD   1 tablet at 12/04/17 0809  . diphenhydrAMINE (BENADRYL) capsule 50 mg  50 mg Oral Q4H PRN [name], MD   50 mg at 12/03/17 2110  . gabapentin (NEURONTIN) capsule 400 mg  400 mg Oral TID PC [name], MD   400 mg at 12/04/17 1427  . hydrochlorothiazide (HYDRODIURIL) tablet 25 mg  25 mg Oral Daily [name], MD   25 mg at 12/04/17 0809  . hydrOXYzine (ATARAX/VISTARIL) tablet 50 mg  50 mg Oral TID PRN [name], MD   50 mg at 12/03/17 2225  . ibuprofen (ADVIL,MOTRIN) tablet 600 mg  600 mg Oral Q6H PRN [name], MD   600 mg at 12/04/17 1427  . lisinopril (PRINIVIL,ZESTRIL) tablet 5 mg  5 mg Oral Daily [name], MD   5 mg at 12/04/17 0820  . loratadine (CLARITIN) tablet 10 mg  10 mg Oral Daily Pucilowska, Jolanta B, MD   10 mg at 12/04/17 0809  . magnesium hydroxide (MILK OF MAGNESIA) suspension 30 mL  30 mL Oral Daily PRN [name], [name], MD   30 mL at 12/01/17 1834  . naphazoline-pheniramine (NAPHCON-A) 0.025-0.3 % ophthalmic solution 2 drop  2 drop Both Eyes QID PRN Pucilowska, Jolanta B, MD   2 drop at 11/29/17 2112  .  neomycin-bacitracin-polymyxin (NEOSPORIN) ointment   Topical BID [name], MD   1 application at 83/15/17 1635  . ondansetron (ZOFRAN-ODT) disintegrating tablet 4 mg  4 mg Oral Q8H PRN [name], MD   4 mg at 12/01/17 0636  . polyethylene glycol (MIRALAX / GLYCOLAX) packet 17 g  17 g Oral BID [name], MD   17 g at 12/04/17 0809  . QUEtiapine (SEROQUEL XR) 24 hr tablet 400 mg  400 mg Oral QHS [name], MD      .  triamcinolone ointment (KENALOG) 0.1 %   Topical TID PRN [name], MD        Lab Results:  No results found for this or any previous visit (from the past 48 hour(s)).  Blood Alcohol level:  Lab Results  Component Value Date   ETH <10 11/16/2017   ETH <10 40/12/6759    Metabolic Disorder Labs: Lab Results  Component Value Date   HGBA1C 6.1 (H) 11/16/2017   MPG 128.37 11/16/2017   No results found for: PROLACTIN Lab Results  Component Value Date   CHOL 164 11/16/2017   TRIG 117 11/16/2017   HDL 60 11/16/2017   CHOLHDL 2.7 11/16/2017   VLDL 23 11/16/2017   LDLCALC 81 11/16/2017    Physical Findings: AIMS: Facial and Oral Movements Muscles of Facial Expression: None, normal Lips and Perioral Area: None, normal Jaw: None, normal Tongue: None, normal,Extremity Movements Upper (arms, wrists, hands, fingers): None, normal Lower (legs, knees, ankles, toes): None, normal,  , Overall Severity Severity of abnormal movements (highest score from questions above): None, normal Incapacitation due to abnormal movements: None, normal Patient's awareness of abnormal movements (rate only patient's report): No Awareness, Dental Status Current problems with teeth and/or dentures?: No Does patient usually wear dentures?: No  CIWA:    COWS:     Musculoskeletal: Strength & Muscle Tone: within normal limits Gait & Station: normal Patient leans: N/A  Psychiatric Specialty Exam: Physical Exam  Nursing note and vitals reviewed. Constitutional: She  appears well-developed and well-nourished.  HENT:  Head: Normocephalic.  Skin: Rash (rash decreasing with triamcinolone cream) noted.  Psychiatric: Her speech is normal. Judgment and thought content normal. Her mood appears not anxious. Her affect is not angry. She is not agitated. Cognition and memory are normal. She does not exhibit a depressed mood.    Review of Systems  Constitutional: Negative for chills and fever.  Gastrointestinal: Negative for constipation.  Skin: Positive for rash (improving with triamcinolone cream).  Neurological: Negative.   Psychiatric/Behavioral: Negative for depression.  All other systems reviewed and are negative.   Blood pressure 126/85, pulse 92, temperature 98.5 F (36.9 C), temperature source Oral, resp. rate 18, height 5\' 2"  (1.575 m), weight 78 kg (172 lb), SpO2 100 %.Body mass index is 31.46 kg/m.  General Appearance: Casual  Eye Contact:  Good  Speech:  Normal Rate  Volume:  Normal  Mood:  Euthymic  Affect:  Full Range  Thought Process:  Goal Directed and Descriptions of Associations: Intact  Orientation:  Full (Time, Place, and Person)  Thought Content:  WDL  Suicidal Thoughts:  No  Homicidal Thoughts:  No  Memory:  Immediate;   Fair Recent;   Fair Remote;   Good  Judgement:  Fair  Insight:  Fair  Psychomotor Activity:  Normal  Concentration:  Concentration: Fair and Attention Span: Fair  Recall:  [employer] of Knowledge:  Fair  Language:  Fair  Akathisia:  No  Handed:  Right  AIMS (if indicated):     Assets:  Communication Skills Desire for Improvement Physical Health Resilience  ADL's:  Intact  Cognition:  WNL  Sleep:  Number of Hours: 6.75     Treatment Plan Summary: Daily contact with patient to assess and evaluate symptoms and progress in treatment and Medication management   Ms. Sobecki is a 52 year old female with a history of depression, mood instability and substance abuse, on suboxone; accepted to [facility].   Patient will be admitted to the inpatient rehab facility  12/06/2017.  Supportive therapy provided.   Pt has rash on upper chest/lower neck, and a few spots on upper and lower bilateral extremities, that has improved with triamcinolone ointment.  Lamotrigine discontinued and dermatology consulted on 11/28/17.  Per dermatology, rash is not Remo Lipps Johnson's syndrome or DRESS.   Pt states she had a similar rash with the effexor XR in the past.     Major Depressive Disorder, recurrent, severe vs Substance-induced Depression; Anxiety disorder, unspecified. Mood improving slowly.  -Changed immediate release seroquel to XR 400 mg nightly for extended coverage -Discontinued lamotrigine on 7/16, (lamotrigine 25 mg qd was started 11/17/17), but pt notified M.D. of rash on 7/16 -Elavil 25 mg nightly started 11/23/2017 -QTc 467  Rash, resolving -stopped lamotrigine  -consulted Dermatology -Benadryl 50 mg q 4 hrs prn itch -per dermatologist, hepatic panel and CBC with diff ordered and both Wnl Triamcinolone 0.1% ointment TID prn until improved to treat any underlying inflammation     Opioid use disorder, severe -suboxone 8-2 mg BID reduced to once a day on 11/20/17  Cocaine use disorder, severe with associated withdrawal symptoms -monitor BP -hydroxyzine 50 mg TID PRN anxiety  Hypertension, stable -HCTZ and lisinopril -monitor potassium  Chronic pain, neuropathic --Gabapentin increased from 300 mg to 400 mg TID for neuropathic pain in back and bilateral upper and lower extremities  Ear/neck pain -  per ENT, ear abx dropcompleted  Abdominal pain -H/H stable -KUB shows "scattered" stool, no impaction -Hemoccult is positive for blood. GI consulted and pt scheduled for colonoscopy on 7/12. -3 polyps removed, awaiting patholoy -continue miralax 17 g BID -f/u with GI in 4 weeks -repeat colonoscopy in 3 yrs  Disposition: residential substance abuse rehab Tentative discharge:  Patient accepted to inpatient rehab facility ([facility]).  Patient will be admitted to the inpatient rehab facility 12/06/2017. Risks (including but not limited rash, allergic reaction, death, Steven-Johnson's syndrome) and benefits discussed with the patient, and pt voices understanding and consents to the treatment plan.      [name], MD 12/04/2017, 4:29 PM

## 2017-12-04 NOTE — Progress Notes (Signed)
D:Aware of leaving  and going to [facility] this week. Improving with emotional and mental status . Voice of no safety concerns . Denies suicidal ideations. Attending unit programing . Interacting  with  peers and staff Patient understanding of information received from staff  Denies suicidal  homicidal ideations .No auditory hallucinations.  No pain concerns . Appropriate ADL'S. Interacting with peers and staff.  A: Encourage patient participation with unit programming . Instruction  Given on  Medication , verbalize understanding. R: Voice no other concerns. Staff continue to monitor

## 2017-12-04 NOTE — Progress Notes (Signed)
Recreation Therapy Notes  Date: 12/04/2017  Time: 9:30 am  Location: Craft Room  Behavioral response: Appropriate    Intervention Topic: Creative Expression  Discussion/Intervention:   Group content on today was focused on creative expressions. The group defined creative expressions and ways they use creative expressions. Individual identified other positive ways creative expressions can be used and why it is important to express yourself. Patients participated in the intervention "expressive painting", where they had a chance to creatively express themselves.  Clinical Observations/Feedback:  Patient came to group and identified communication and cooking as ways she creatively expresses herself. Individual was social with peers and staff while participating in group.    LRT/CTRS              12/04/2017 12:14 PM

## 2017-12-04 NOTE — Plan of Care (Signed)
Aware of leaving  and going to [facility] this week. Improving with emotional and mental status . Voice of no safety concerns . Denies suicidal ideations. Attending unit programing . Interacting  with  peers and staff Patient understanding of information received from staff     Problem: Education: Goal: Knowledge of [facility] Education information/materials will improve Outcome: Progressing Goal: Emotional status will improve Outcome: Progressing Goal: Mental status will improve Outcome: Progressing Goal: Verbalization of understanding the information provided will improve Outcome: Progressing   Problem: Safety: Goal: Periods of time without injury will increase Outcome: Progressing   Problem: Safety: Goal: Ability to disclose and discuss suicidal ideas will improve Outcome: Progressing   Problem: Safety: Goal: Ability to remain free from injury will improve Outcome: Progressing

## 2017-12-05 DIAGNOSIS — F1994 Other psychoactive substance use, unspecified with psychoactive substance-induced mood disorder: Secondary | ICD-10-CM

## 2017-12-05 DIAGNOSIS — F333 Major depressive disorder, recurrent, severe with psychotic symptoms: Secondary | ICD-10-CM | POA: Insufficient documentation

## 2017-12-05 DIAGNOSIS — F1420 Cocaine dependence, uncomplicated: Secondary | ICD-10-CM

## 2017-12-05 DIAGNOSIS — F3289 Other specified depressive episodes: Secondary | ICD-10-CM

## 2017-12-05 DIAGNOSIS — F1120 Opioid dependence, uncomplicated: Secondary | ICD-10-CM

## 2017-12-05 LAB — COMPREHENSIVE METABOLIC PANEL
ALK PHOS: 74 U/L (ref 38–126)
ALT: 29 U/L (ref 0–44)
ANION GAP: 6 (ref 5–15)
AST: 50 U/L — ABNORMAL HIGH (ref 15–41)
Albumin: 4 g/dL (ref 3.5–5.0)
BUN: 26 mg/dL — ABNORMAL HIGH (ref 6–20)
CALCIUM: 9.4 mg/dL (ref 8.9–10.3)
CO2: 31 mmol/L (ref 22–32)
Chloride: 102 mmol/L (ref 98–111)
Creatinine, Ser: 1.06 mg/dL — ABNORMAL HIGH (ref 0.44–1.00)
GFR, EST NON AFRICAN AMERICAN: 60 mL/min — AB (ref >60)
Glucose, Bld: 125 mg/dL — ABNORMAL HIGH (ref 70–99)
Potassium: 4.2 mmol/L (ref 3.5–5.1)
Sodium: 139 mmol/L (ref 135–145)
TOTAL PROTEIN: 7.4 g/dL (ref 6.5–8.1)
Total Bilirubin: 0.3 mg/dL (ref 0.3–1.2)

## 2017-12-05 LAB — TSH: TSH: 12.004 u[IU]/mL — AB (ref 0.350–4.500)

## 2017-12-05 MED ORDER — POLYETHYLENE GLYCOL 3350 17 G PO PACK
17.0000 g | PACK | Freq: Every day | ORAL | Status: DC
  Filled 2017-12-05: qty 1

## 2017-12-05 MED ORDER — QUETIAPINE FUMARATE 200 MG PO TABS
400.0000 mg | ORAL_TABLET | Freq: Every day | ORAL | Status: DC
  Administered 2017-12-05: 400 mg via ORAL
  Filled 2017-12-05: qty 2

## 2017-12-05 NOTE — Progress Notes (Signed)
D- Patient alert and oriented. Patient presents in a pleasant mood on assessment reporting that she slept good last night although she states "Seroquel makes me feel sleepy in the morning". Patient rated her back pain a "4/10", however, she did not request any pain medication from this Probation officer. Patient denies SI, HI, AVH, and at this time. Patient's goal for today is to work on her "attitude".  A- Scheduled medications administered to patient, per MD orders. Support and encouragement provided.  Routine safety checks conducted every 15 minutes.  Patient informed to notify staff with problems or concerns.  R- No adverse drug reactions noted. Patient contracts for safety at this time. Patient compliant with medications and treatment plan. Patient receptive, calm, and cooperative. Patient interacts well with others on the unit.  Patient remains safe at this time.

## 2017-12-05 NOTE — BHH Group Notes (Signed)
[facility] Group Notes:  (Nursing/MHT/Case Management/Adjunct)  Date:  12/05/2017  Time:  10:36 PM  Type of Therapy:  Group Therapy  Participation Level:  Active  Participation Quality:  Appropriate  Affect:  Appropriate  Cognitive:  Appropriate  Insight:  Appropriate  Engagement in Group:  Engaged  Modes of Intervention:  Discussion  Summary of Progress/Problems: Amiera stated she was going to try to stay around positive people. Elany reported she was leaving for [facility] on the next day. Stacee reports feeling hopeful. MHT reviewed rules and expectations of unit. MHT encouraged patients to clean up after themselves. MHT informed patients not to take food back to the rooms. MHT informed patients of visitation and phone hours. MHT informed patients of vitals at 6am and encouraged everyone to get a good night sleep. MHT informed patients that techs would be looking in rooms routinely to perform checks and for everyone to keep covered up throughout the night. MHT covered topic of preventing and managing stress. MHT discussed identifying stressors in patient's life. MHT discussed the importance of relaxing and taking time for self. MHT discussed self-awareness and preparing for the things that cause stress. MHT provided examples of how to deal with the stressors of hearing voices. MHT discussed taking medications as prescribed. Group discussion on strategies to help with hearing voices. Things to try brought up in group were using head phones, using ear buds, cotton swabs, exercising, and talking to doctor if medications were not effective. MHT discussed the signs of stress and informed of burn out.  Barnie Mort 12/05/2017, 10:36 PM

## 2017-12-05 NOTE — BHH Group Notes (Signed)
CSW Group Therapy Note  12/05/2017  Time:  0900  Type of Therapy and Topic: Group Therapy: Goals Group: SMART Goals    Participation Level:  Active    Description of Group:   The purpose of a daily goals group is to assist and guide patients in setting recovery/wellness-related goals. The objective is to set goals as they relate to the crisis in which they were admitted. Patients will be using SMART goal modalities to set measurable goals. Characteristics of realistic goals will be discussed and patients will be assisted in setting and processing how one will reach their goal. Facilitator will also assist patients in applying interventions and coping skills learned in psycho-education groups to the SMART goal and process how one will achieve defined goal.    Therapeutic Goals:  -Patients will develop and document one goal related to or their crisis in which brought them into treatment.  -Patients will be guided by LCSW using SMART goal setting modality in how to set a measurable, attainable, realistic and time sensitive goal.  -Patients will process barriers in reaching goal.  -Patients will process interventions in how to overcome and successful in reaching goal.    Patient's Goal:   Pt continues to work towards their tx goals but has not yet reached them. Pt was able to appropriately participate in group discussion, and was able to offer support/validation to other group members. Pt reported her goal is, "to talk to my doctor at least once about changing my medications so I don't get so sleepy, by the end of today."   Therapeutic Modalities:  Motivational Interviewing  Cognitive Behavioral Therapy  Crisis Intervention Model  SMART goals setting  Alden Hipp, MSW, LCSW Clinical Social Worker 12/05/2017 9:41 AM
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
## 2017-12-05 NOTE — Progress Notes (Addendum)
Patient ID: Teresa Jefferson, female   DOB: 19-Sep-1965, 52 y.o.   MRN: [id number] CSW spoke with Amy at [facility] and confirmed Pt acceptance for tomorrow am.  CSW notified her that [employer] would be bringing her and would not be able to get her there right at [place] estimates it would be more like 9:30/10am due to driver schedules.  She says this will not be a problem for admission.  CSW will notify RN's of Pt plancs for tomorrow. Accepting physician is Tacy Learn.  Dossie Arbour, LCSW

## 2017-12-05 NOTE — Plan of Care (Addendum)
Patient found in common area upon my arrival. Patient is visible and social this evening. Patient reports some anxiety, given Vistaril. Complains of itching, given benadryl. Complains of back pain, given Motrin. All PRNs given with positive results. Denies SI/HI/AVH. Affect is bright and cheerful. Patient reports that the "voices" she thought she heard last night were heard by peers. "I feel better now because I was afraid to go to sleep." Reports eating and voiding adequately. Compliant with HS medication and staff direction. Q 15 minute checks maintained. Will continue to monitor throughout the shift. Patient slept 6 hours. No apparent distress. Complains of back pain, Motrin. Will endorse care to oncoming shift.  Problem: Education: Goal: Knowledge of [facility] Education information/materials will improve Outcome: Progressing Goal: Emotional status will improve Outcome: Progressing Goal: Mental status will improve Outcome: Progressing Goal: Verbalization of understanding the information provided will improve Outcome: Progressing   Problem: Safety: Goal: Periods of time without injury will increase Outcome: Progressing   Problem: Safety: Goal: Ability to disclose and discuss suicidal ideas will improve Outcome: Progressing   Problem: Safety: Goal: Ability to remain free from injury will improve Outcome: Progressing

## 2017-12-05 NOTE — BHH Group Notes (Signed)
12/05/2017 1PM  Type of Therapy/Topic:  Group Therapy:  Feelings about Diagnosis  Participation Level:  Minimal   Description of Group:   This group will allow patients to explore their thoughts and feelings about diagnoses they have received. Patients will be guided to explore their level of understanding and acceptance of these diagnoses. Facilitator will encourage patients to process their thoughts and feelings about the reactions of others to their diagnosis and will guide patients in identifying ways to discuss their diagnosis with significant others in their lives. This group will be process-oriented, with patients participating in exploration of their own experiences, giving and receiving support, and processing challenge from other group members.   Therapeutic Goals: 1. Patient will demonstrate understanding of diagnosis as evidenced by identifying two or more symptoms of the disorder 2. Patient will be able to express two feelings regarding the diagnosis 3. Patient will demonstrate their ability to communicate their needs through discussion and/or role play  Summary of Patient Progress: Actively and appropriately engaged in the group. Patient was able to provide support and validation to other group members.Patient practiced active listening when interacting with the facilitator and other group members. Teresa Jefferson states that a symptom of her diagnosis as being anxious. She spoke about particularly anxious about going to [facility]. Patient is still in the process of obtaining treatment goals.        Therapeutic Modalities:   Cognitive Behavioral Therapy Brief Therapy Feelings Identification    Darin Engels, Teresa Jefferson 12/05/2017 2:31 PM

## 2017-12-05 NOTE — BHH Group Notes (Signed)
[facility] Group Notes:  (Nursing/MHT/Case Management/Adjunct)  Date:  12/05/2017  Time:  2:44 PM  Type of Therapy:  Psychoeducational Skills  Participation Level:  Active  Participation Quality:  Appropriate, Attentive and Sharing  Affect:  Appropriate  Cognitive:  Alert and Appropriate  Insight:  Appropriate  Engagement in Group:  Engaged  Modes of Intervention:  Discussion, Education and Support  Summary of Progress/Problems:  Adela Lank [facility] 12/05/2017, 2:44 PM

## 2017-12-05 NOTE — Progress Notes (Addendum)
[facility] MD Progress Note  12/05/2017 5:01 PM KYNLI CHOU  MRN:  [id number]  Subjective:   Patient reports daytime sedation with the seroquel XR. She would like seroquel formulation changed back to the immediate release.  Patient is thrilled for discharge tomorrow to [facility].  Additionally, pt has found another boarding room that may be a better option for her.  She states the new boarding room is in [place] instead of [place], which would allow her to be closer to her daughter and grandchildren and it's in a nicer part of town.  Patient is medication compliant.  She denies SI, HI, AH, VH, paranoia.  She is complying with treatment and going to groups.  Pt reports good appetite. She states miralax twice a day may too much at this point and wants to cut it back to once a day.   Pt is no longer constipated.  Pt understands that she will need to follow up with the GI specialist in 3-4 weeks.  Both patient's Education officer, museum and I called [facility] outpt clinic to schedule pt's appointment but was directed to leave a VM.  Social worker never received a call back.   She also understands that she will need to follow up with her primary care provider once she discharges from [facility] for her HTN and hypothyroidism.   Principal Problem: MDD (major depressive disorder), recurrent, severe, with psychosis ([place]) Diagnosis:   Patient Active Problem List   Diagnosis Date Noted  . MDD (major depressive disorder), recurrent, severe, with psychosis ([place]) [F33.3] 12/05/2017  . Essential hypertension [I10] 11/17/2017  . Asthma [J45.909] 11/17/2017  . 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[place]) [Y30.16, F32.89] 11/17/2017  . Cocaine use disorder, severe, dependence ([place]) [F14.20] 11/17/2017  . Opioid use disorder, severe, dependence ([place]) [F11.20] 11/17/2017  . Severe episode of recurrent major depressive disorder, without psychotic features ([place]) [F33.2] 11/16/2017  . Opiate abuse, continuous ([place])  [F11.10] 11/16/2017  . Cocaine abuse ([place]) [F14.10] 11/16/2017  . Suicidal ideation [R45.851] 11/16/2017  . Chronic pain [G89.29] 11/16/2017   Total Time spent with patient, discussing pt with treatment team,  reviewing chart, and providing supportive therapy.: 35 min  Past Psychiatric History: depression, substance abuse  Past Medical History:  Past Medical History:  Diagnosis Date  . Bipolar 1 disorder ([place])   . Depression   . Hypertension   . Neuropathy   . Schizophrenia ([place])   . Thyroid disease     Past Surgical History:  Procedure Laterality Date  . ABDOMINAL HYSTERECTOMY    . COLONOSCOPY N/A 11/23/2017   Procedure: COLONOSCOPY;  Surgeon: Lin Landsman, MD;  Location: [facility] ENDOSCOPY;  Service: Gastroenterology;  Laterality: N/A;  . COLONOSCOPY N/A 11/24/2017   Procedure: COLONOSCOPY;  Surgeon: Lin Landsman, MD;  Location: [facility] ENDOSCOPY;  Service: Gastroenterology;  Laterality: N/A;  . KNEE SURGERY Right   . TONSILLECTOMY     Family History: History reviewed. No pertinent family history. Family Psychiatric  History: see H&P Social History:  Social History   Substance and Sexual Activity  Alcohol Use No   Comment: occ     Social History   Substance and Sexual Activity  Drug Use Yes   Comment: crack and opiates    Social History   Socioeconomic History  . Marital status: Legally Separated    Spouse name: Not on file  . Number of children: Not on file  . Years of education: Not on file  . Highest education level: Not on file  Occupational History  . Not on file  Social Needs  . Financial resource strain: Not on file  . Food insecurity:    Worry: Not on file    Inability: Not on file  . Transportation needs:    Medical: Not on file    Non-medical: Not on file  Tobacco Use  . Smoking status: Never Smoker  . Smokeless tobacco: Never Used  Substance and Sexual Activity  . Alcohol use: No    Comment: occ  . Drug use: Yes    Comment: crack  and opiates  . Sexual activity: Not on file  Lifestyle  . Physical activity:    Days per week: Not on file    Minutes per session: Not on file  . Stress: Not on file  Relationships  . Social connections:    Talks on phone: Not on file    Gets together: Not on file    Attends religious service: Not on file    Active member of club or organization: Not on file    Attends meetings of clubs or organizations: Not on file    Relationship status: Not on file  Other Topics Concern  . Not on file  Social History Narrative  . Not on file   Additional Social History: see H&P  Sleep: Good  Appetite:  Good  Current Medications: Current Facility-Administered Medications  Medication Dose Route Frequency Provider Last Rate Last Dose  . 0.9 %  sodium chloride infusion   Intravenous Continuous Lin Landsman, MD 20 mL/hr at 11/23/17 1321 1,000 mL at 11/23/17 1321  . 0.9 %  sodium chloride infusion   Intravenous Continuous [place], Tally Due, MD      . acetaminophen (TYLENOL) tablet 650 mg  650 mg Oral Q6H PRN [name], [name], MD   650 mg at 12/02/17 2202  . albuterol (PROVENTIL HFA;VENTOLIN HFA) 108 (90 Base) MCG/ACT inhaler 1-2 puff  1-2 puff Inhalation Q6H PRN [name], MD      . alum & mag hydroxide-simeth (MAALOX/MYLANTA) 200-200-20 MG/5ML suspension 30 mL  30 mL Oral Q4H PRN [name], [name], MD   30 mL at 12/01/17 1026  . amitriptyline (ELAVIL) tablet 25 mg  25 mg Oral QHS [name], MD   25 mg at 12/04/17 2120  . buprenorphine-naloxone (SUBOXONE) 8-2 mg per SL tablet 1 tablet  1 tablet Sublingual Daily [name], MD   1 tablet at 12/05/17 0837  . diphenhydrAMINE (BENADRYL) capsule 50 mg  50 mg Oral Q4H PRN [name], MD   50 mg at 12/04/17 2001  . gabapentin (NEURONTIN) capsule 400 mg  400 mg Oral TID PC O'Neal, , MD   400 mg at 12/05/17 1357  . hydrochlorothiazide (HYDRODIURIL) tablet 25 mg  25 mg Oral Daily [name], MD   25 mg at 12/05/17 0837  .  hydrOXYzine (ATARAX/VISTARIL) tablet 50 mg  50 mg Oral TID PRN [name], MD   50 mg at 12/04/17 2002  . ibuprofen (ADVIL,MOTRIN) tablet 600 mg  600 mg Oral Q6H PRN [name], MD   600 mg at 12/05/17 1341  . lisinopril (PRINIVIL,ZESTRIL) tablet 5 mg  5 mg Oral Daily [name], MD   5 mg at 12/05/17 0837  . loratadine (CLARITIN) tablet 10 mg  10 mg Oral Daily Pucilowska, Jolanta B, MD   10 mg at 12/05/17 0837  . magnesium hydroxide (MILK OF MAGNESIA) suspension 30 mL  30 mL Oral Daily PRN [name], [name], MD  30 mL at 12/01/17 1834  . naphazoline-pheniramine (NAPHCON-A) 0.025-0.3 % ophthalmic solution 2 drop  2 drop Both Eyes QID PRN Pucilowska, Jolanta B, MD   2 drop at 12/05/17 1342  . ondansetron (ZOFRAN-ODT) disintegrating tablet 4 mg  4 mg Oral Q8H PRN [name], MD   4 mg at 12/01/17 0636  . [START ON 12/06/2017] polyethylene glycol (MIRALAX / GLYCOLAX) packet 17 g  17 g Oral Daily [name], MD      . QUEtiapine (SEROQUEL) tablet 400 mg  400 mg Oral QHS [name], MD      . triamcinolone ointment (KENALOG) 0.1 %   Topical TID PRN [name], MD        Lab Results:  Results for orders placed or performed during the hospital encounter of 11/16/17 (from the past 48 hour(s))  Comprehensive metabolic panel     Status: Abnormal   Collection Time: 12/05/17  3:14 PM  Result Value Ref Range   Sodium 139 135 - 145 mmol/L   Potassium 4.2 3.5 - 5.1 mmol/L   Chloride 102 98 - 111 mmol/L   CO2 31 22 - 32 mmol/L   Glucose, Bld 125 (H) 70 - 99 mg/dL   BUN 26 (H) 6 - 20 mg/dL   Creatinine, Ser 1.06 (H) 0.44 - 1.00 mg/dL   Calcium 9.4 8.9 - 10.3 mg/dL   Total Protein 7.4 6.5 - 8.1 g/dL   Albumin 4.0 3.5 - 5.0 g/dL   AST 50 (H) 15 - 41 U/L   ALT 29 0 - 44 U/L   Alkaline Phosphatase 74 38 - 126 U/L   Total Bilirubin 0.3 0.3 - 1.2 mg/dL   GFR calc non Af Amer 60 (L) >60 mL/min   GFR calc Af Amer >60 >60 mL/min    Comment: (NOTE) The eGFR has been calculated using the  CKD EPI equation. This calculation has not been validated in all clinical situations. eGFR's persistently <60 mL/min signify possible Chronic Kidney Disease.    Anion gap 6 5 - 15    Comment: Performed at [facility], [address] [place], [place] [address]    Blood Alcohol level:  Lab Results  Component Value Date   [facility] <10 11/16/2017   ETH <10 40/97/3532    Metabolic Disorder Labs: Lab Results  Component Value Date   HGBA1C 6.1 (H) 11/16/2017   MPG 128.37 11/16/2017   No results found for: PROLACTIN Lab Results  Component Value Date   CHOL 164 11/16/2017   TRIG 117 11/16/2017   HDL 60 11/16/2017   CHOLHDL 2.7 11/16/2017   VLDL 23 11/16/2017   LDLCALC 81 11/16/2017    Physical Findings: AIMS: Facial and Oral Movements Muscles of Facial Expression: None, normal Lips and Perioral Area: None, normal Jaw: None, normal Tongue: None, normal,Extremity Movements Upper (arms, wrists, hands, fingers): None, normal Lower (legs, knees, ankles, toes): None, normal,  , Overall Severity Severity of abnormal movements (highest score from questions above): None, normal Incapacitation due to abnormal movements: None, normal Patient's awareness of abnormal movements (rate only patient's report): No Awareness, Dental Status Current problems with teeth and/or dentures?: No Does patient usually wear dentures?: No  CIWA:    COWS:     Musculoskeletal: Strength & Muscle Tone: within normal limits Gait & Station: normal Patient leans: N/A  Psychiatric Specialty Exam: Physical Exam  Nursing note and vitals reviewed. Constitutional: She appears well-developed and well-nourished.  HENT:  Head: Normocephalic.  Skin: Rash (rash decreasing  with triamcinolone cream) noted.  Psychiatric: Her speech is normal. Judgment and thought content normal. Her mood appears not anxious. Her affect is not angry. She is not agitated. Cognition and memory are normal. She does not exhibit a  depressed mood.    Review of Systems  Constitutional: Negative for chills and fever.  Gastrointestinal: Negative for constipation.  Skin: Positive for rash (improving with triamcinolone cream).  Neurological: Negative.   Psychiatric/Behavioral: Negative for depression.  All other systems reviewed and are negative.   Blood pressure 104/87, pulse 90, temperature 97.9 F (36.6 C), temperature source Oral, resp. rate 18, height 5' 2"  (1.575 m), weight 78 kg (172 lb), SpO2 100 %.Body mass index is 31.46 kg/m.  General Appearance: Casual  Eye Contact:  Good  Speech:  Normal Rate  Volume:  Normal  Mood:  Euthymic  Affect:  Full Range  Thought Process:  Goal Directed and Descriptions of Associations: Intact  Orientation:  Full (Time, Place, and Person)  Thought Content:  WDL  Suicidal Thoughts:  No  Homicidal Thoughts:  No  Memory:  Immediate;   Fair Recent;   Fair Remote;   Good  Judgement:  Fair  Insight:  Fair  Psychomotor Activity:  Normal  Concentration:  Concentration: Fair and Attention Span: Fair  Recall:  [employer] of Knowledge:  Fair  Language:  Fair  Akathisia:  No  Handed:  Right  AIMS (if indicated):     Assets:  Communication Skills Desire for Improvement Physical Health Resilience  ADL's:  Intact  Cognition:  WNL  Sleep:  Number of Hours: 6     Treatment Plan Summary: Daily contact with patient to assess and evaluate symptoms and progress in treatment and Medication management   Ms. Falor is a 52 year old female with a history of depression, mood instability and substance abuse, on suboxone; accepted to [facility].  Patient will be admitted to the inpatient rehab facility 12/06/2017.  Supportive therapy provided.   Pt has rash on upper chest/lower neck, and a few spots on upper and lower bilateral extremities, that has improved with triamcinolone ointment.  Lamotrigine discontinued and dermatology consulted on 11/28/17.  Per dermatology, rash is not Remo Lipps  Johnson's syndrome or DRESS.   Pt states she had a similar rash with the effexor XR in the past.     Major Depressive Disorder, recurrent, severe vs Substance-induced Depression; Anxiety disorder, unspecified. Mood improving slowly.  -Changed immediate release seroquel to XR 400 mg nightly for extended coverage -Discontinued lamotrigine on 7/16, (lamotrigine 25 mg qd was started 11/17/17), but pt notified M.D. of rash on 7/16 -Elavil 25 mg nightly started 11/23/2017 -QTc 467 -supportive therapy provided  Rash, resolving -stopped lamotrigine  -consulted Dermatology -Benadryl 50 mg q 4 hrs prn itch -per dermatologist, hepatic panel and CBC with diff ordered and both Wnl Triamcinolone 0.1% ointment TID prn until improved to treat any underlying inflammation     Opioid use disorder, severe -suboxone 8-2 mg BID reduced to once a day on 11/20/17  Cocaine use disorder, severe with associated withdrawal symptoms -monitor BP -hydroxyzine 50 mg TID PRN anxiety  Hypertension, stable -HCTZ and lisinopril -monitor potassium-will recheck CMP today  Chronic pain, neuropathic --continue gabapentin 400 mg TID for neuropathic pain in back and bilateral upper and lower extremities  Ear/neck pain -  per ENT, ear abx dropcompleted  Abdominal pain due to constipation, resolved -H/H stable -KUB shows "scattered" stool, no impaction -Hemoccult is positive for blood. GI consulted and pt scheduled  for colonoscopy on 7/12. -3 polyps removed, awaiting patholoy -change miralax from BID to once daily -f/u with GI in 4 weeks -repeat colonoscopy in 3 yrs  History of hypothyroidism -will get repeat TSH today.  -will need f/u with PCP  Disposition: residential substance abuse rehab Tentative discharge: Patient accepted to inpatient rehab facility ([facility]).  Patient will be admitted to the inpatient rehab facility 12/06/2017. Risks (including but not limited rash, allergic reaction, death,  Steven-Johnson's syndrome) and benefits discussed with the patient, and pt voices understanding and consents to the treatment plan.      [name], MD 12/05/2017, 5:01 PM

## 2017-12-05 NOTE — Progress Notes (Signed)
Recreation Therapy Notes   Date: 12/05/2017  Time: 9:30 am  Location: Craft Room  Behavioral response: Appropriate    Intervention Topic: Values  Discussion/Intervention:  Group content today was focused on values. The group identified what values are and where they come from. Individuals expressed some values and how many they have. Patients described how they go about adding or removing values. The group described the importance of having values and how they go about using them in daily life. Patient participated in the intervention "My Values" where they were able to pick out values that were important to them and make a visual aide.  Clinical Observations/Feedback:  Patient came to group and was focused on what her peers and staff had to say about values.   LRT/CTRS             12/05/2017 10:51 AM

## 2017-12-06 DIAGNOSIS — F1120 Opioid dependence, uncomplicated: Secondary | ICD-10-CM

## 2017-12-06 DIAGNOSIS — F1420 Cocaine dependence, uncomplicated: Secondary | ICD-10-CM

## 2017-12-06 DIAGNOSIS — F3289 Other specified depressive episodes: Secondary | ICD-10-CM

## 2017-12-06 DIAGNOSIS — F1994 Other psychoactive substance use, unspecified with psychoactive substance-induced mood disorder: Secondary | ICD-10-CM

## 2017-12-06 MED ORDER — HYDROXYZINE HCL 50 MG PO TABS: 50.0000 mg | ORAL_TABLET | Freq: Three times a day (TID) | ORAL | 0 refills | Status: DC | PRN

## 2017-12-06 MED ORDER — GABAPENTIN 400 MG PO CAPS: 400.0000 mg | ORAL_CAPSULE | Freq: Three times a day (TID) | ORAL | 0 refills | Status: DC

## 2017-12-06 MED ORDER — LORATADINE 10 MG PO TABS: 10.0000 mg | ORAL_TABLET | Freq: Every day | ORAL | 0 refills | Status: DC

## 2017-12-06 MED ORDER — LISINOPRIL 5 MG PO TABS: 5.0000 mg | ORAL_TABLET | Freq: Every day | ORAL | 0 refills | Status: DC

## 2017-12-06 MED ORDER — TRIAMCINOLONE ACETONIDE 0.1 % EX OINT: TOPICAL_OINTMENT | Freq: Three times a day (TID) | CUTANEOUS | 0 refills | Status: DC | PRN

## 2017-12-06 MED ORDER — QUETIAPINE FUMARATE 400 MG PO TABS
400.0000 mg | ORAL_TABLET | Freq: Every day | ORAL | 0 refills | Status: DC
Start: 2017-12-06 — End: 2018-12-21

## 2017-12-06 MED ORDER — LEVOTHYROXINE SODIUM 50 MCG PO TABS: 50.0000 ug | ORAL_TABLET | Freq: Every day | ORAL | 0 refills | Status: DC

## 2017-12-06 MED ORDER — AMITRIPTYLINE HCL 25 MG PO TABS: 25.0000 mg | ORAL_TABLET | Freq: Every day | ORAL | 0 refills | Status: DC

## 2017-12-06 MED ORDER — HYDROCHLOROTHIAZIDE 25 MG PO TABS: 25.0000 mg | ORAL_TABLET | Freq: Every day | ORAL | 0 refills | Status: DC

## 2017-12-06 MED ORDER — POLYETHYLENE GLYCOL 3350 17 G PO PACK: 17.0000 g | PACK | Freq: Every day | ORAL | 0 refills | Status: DC

## 2017-12-06 MED ORDER — ALBUTEROL SULFATE HFA 108 (90 BASE) MCG/ACT IN AERS: 1.0000 | INHALATION_SPRAY | Freq: Four times a day (QID) | RESPIRATORY_TRACT | 0 refills | Status: DC | PRN

## 2017-12-06 MED ORDER — LEVOTHYROXINE SODIUM 50 MCG PO TABS: 50.0000 ug | ORAL_TABLET | Freq: Every day | ORAL | Status: DC

## 2017-12-06 MED ORDER — BUPRENORPHINE HCL-NALOXONE HCL 8-2 MG SL SUBL: 1.0000 | SUBLINGUAL_TABLET | Freq: Every day | SUBLINGUAL | Status: AC

## 2017-12-06 NOTE — Plan of Care (Addendum)
Patient found in common area upon my arrival. Patient is visible and social this evening. Reports mood is "good." Reports slight anxiety regarding her discharge, given Vistaril. Complains of back pain, given Motrin. Given Benadryl for itching. All PRNs produce good results. Denies SI/HI/AVH. Reports that she is very happy to be going to [facility]. She is feeling positive about the future and feels ready for discharge. Compliant with HS medications and staff direction. Attends group. Reports eating and voiding adequately. Q 15 minute checks maintained. Will continue to monitor throughout the shift. Patient slept 7.25 hours. No apparent distress. Will endorse care to oncoming shift.  Problem: Education: Goal: Knowledge of [facility] Education information/materials will improve Outcome: Progressing Goal: Emotional status will improve Outcome: Progressing Goal: Mental status will improve Outcome: Progressing Goal: Verbalization of understanding the information provided will improve Outcome: Progressing   Problem: Safety: Goal: Periods of time without injury will increase Outcome: Progressing   Problem: Safety: Goal: Ability to disclose and discuss suicidal ideas will improve Outcome: Progressing   Problem: Safety: Goal: Ability to remain free from injury will improve Outcome: Progressing

## 2017-12-06 NOTE — Progress Notes (Signed)
Recreation Therapy Notes  INPATIENT RECREATION TR PLAN  Patient Details Name: Teresa Jefferson MRN: [id number] DOB: [birth year] Today's Date: 12/06/2017  Rec Therapy Plan Is patient appropriate for Therapeutic Recreation?: Yes Treatment times per week: at least 3 Estimated Length of Stay: 5-7 days TR Treatment/Interventions: Group participation (Comment)  Discharge Criteria Pt will be discharged from therapy if:: Discharged Treatment plan/goals/alternatives discussed and agreed upon by:: Patient/family  Discharge Summary Short term goals set: Patient will identify 3 positive coping skills strategies to use post d/c within 5 recreation therapy group sessions Short term goals met: Complete Progress toward goals comments: Groups attended Which groups?: Goal setting, Leisure education, Stress management, Anger management, Coping skills, Self-esteem, Other (Comment)(Team work, relaxation, values, creative expression) Reason goals not met: N/A Therapeutic equipment acquired: N/A Reason patient discharged from therapy: Discharge from hospital Pt/family agrees with progress & goals achieved: Yes Date patient discharged from therapy: 12/06/17      12/06/2017, 2:46 PM

## 2017-12-06 NOTE — Discharge Summary (Addendum)
Physician Discharge Summary Note  Patient:  Teresa Jefferson is an 52 y.o., female MRN:  [id number] DOB:  Oct 29, 1965 Patient phone:  [phone] (home)  Patient address:   [facility] [place] [address],   Total Time spent with patient, preparing patient's discharge, and discussing patient with her treatment team: 1 hour   Date of Admission:  11/16/2017 Date of Discharge: 12/06/2017  Reason for Admission:  Depression, Opioid use disorder  Principal Problem: Severe episode of recurrent major depressive disorder, without psychotic features (HCC)-Addendum: correct diagnosis code made on 12/15/17 Discharge Diagnoses: Patient Active Problem List   Diagnosis Date Noted  . MDD (major depressive disorder), recurrent, severe, with psychosis ([place]) [F33.3] 12/05/2017  . Essential hypertension [I10] 11/17/2017  . Asthma [J45.909] 11/17/2017  . 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[place]) [H06.23, F32.89] 11/17/2017  . Cocaine use disorder, severe, dependence ([place]) [F14.20] 11/17/2017  . Opioid use disorder, severe, dependence ([place]) [F11.20] 11/17/2017  . Severe episode of recurrent major depressive disorder, without psychotic features ([place]) [F33.2] 11/16/2017  . Opiate abuse, continuous ([place]) [F11.10] 11/16/2017  . Cocaine abuse ([place]) [F14.10] 11/16/2017  . Suicidal ideation [R45.851] 11/16/2017  . Chronic pain [G89.29] 11/16/2017    Past Psychiatric History:  Psychiatric hospitalizations: prior [place] admission greater than 10 yrs ago; [facility] Dec 2018 Prior Diagnoses: Schizophrenia, Bipolar Disorder, Major Depression, Anxiety Prior Psychotropic Medications: pt states she's tried the following medications and none of them worked-Prozac, Zoloft, Celexa, Effexor (rash/whelps), Wellbutrin, Cymbalta, Remeron, Paxil, Lexapro.  However, pt states elavil worked for sleep, depression and neuropathic pain-tried many years ago. Also, pt states ativan helped with  anxiety.  Trazodone causes pt to feel like she's in a fog.  Pt is currently on seroquel ER 400 mg nightly for sleep and hx of bipolar disorder-gets Rx from the [facility]" Pt states she tried lamictal several years ago and like it b/c it helped with her depression and mania Pt denies hx of prior suicide attempts  Past Medical History:  Past Medical History:  Diagnosis Date  . Bipolar 1 disorder ([place])   . Depression   . Hypertension   . Neuropathy   . Schizophrenia ([place])   . Thyroid disease     Past Surgical History:  Procedure Laterality Date  . ABDOMINAL HYSTERECTOMY    . COLONOSCOPY N/A 11/23/2017   Procedure: COLONOSCOPY;  Surgeon: Lin Landsman, MD;  Location: [facility] ENDOSCOPY;  Service: Gastroenterology;  Laterality: N/A;  . COLONOSCOPY N/A 11/24/2017   Procedure: COLONOSCOPY;  Surgeon: Lin Landsman, MD;  Location: [facility] ENDOSCOPY;  Service: Gastroenterology;  Laterality: N/A;  . KNEE SURGERY Right   . TONSILLECTOMY     Family History: History reviewed. No pertinent family history.   Family Psychiatric  History: unknown  Social History:  Social History   Substance and Sexual Activity  Alcohol Use No   Comment: occ     Social History   Substance and Sexual Activity  Drug Use Yes   Comment: crack and opiates    Social History   Socioeconomic History  . Marital status: Legally Separated    Spouse name: Not on file  . Number of children: Not on file  . Years of education: Not on file  . Highest education level: Not on file  Occupational History  . Not on file  Social Needs  . Financial resource strain: Not on file  . Food insecurity:    Worry: Not on file    Inability: Not on file  .  Transportation needs:    Medical: Not on file    Non-medical: Not on file  Tobacco Use  . Smoking status: Never Smoker  . Smokeless tobacco: Never Used  Substance and Sexual Activity  . Alcohol use: No    Comment: occ  . Drug use: Yes    Comment: crack and  opiates  . Sexual activity: Not on file  Lifestyle  . Physical activity:    Days per week: Not on file    Minutes per session: Not on file  . Stress: Not on file  Relationships  . Social connections:    Talks on phone: Not on file    Gets together: Not on file    Attends religious service: Not on file    Active member of club or organization: Not on file    Attends meetings of clubs or organizations: Not on file    Relationship status: Not on file  Other Topics Concern  . Not on file  Social History Narrative  . Not on file    History of Present Illness from Admission H&P done on 11/17/2017 :  Teresa Jefferson is a 52 yo female with psychiatric hx of depression and anxiety; substance use history of cocaine and opiates, and medical hx of asthma, hypertension, and chronic pain.  Patient presented on her own volition to "get clean" and "get my mind right".  She reports a depressed mood, anhedonia, hopelessness, and helplessness secondary to her substance abuse issues.   Pt states she's been using crack cocaine for several years.  She and her spouse of over 20 yrs smoke crack cocaine together.  She had split with her spouse some time back, but then reconciled with him in April 2019.  Pt states since reconciliation, "I've been doing drugs every day."  Two days ago, she had her husband separated again and she states she's been feeling depressed and went on another drug binger.  She admits to smoking cocaine "everyday" and snorting or ingesting at least 8 tabs of percocets and vicodin a day.  She was successful on suboxone from [facility] in the past and would like to get back on it. Of note, the ED psychiatric consultant did re-initiate suboxone.  Pt denies ETOH use.  She admits to smoking THC several years ago, but no recent use.  Pt tried snorting heroin 3 yrs ago, but no recent use.  Pt is unemployed and homeless.  She is on disability for bipolar disorder and schizophrenia per her report.  She  would like to get clean so she can reconcile with her 3 adult children and 8 grandkids b/c "they disowned me when I got back with my husband in April."   Pt denies active suicidal ideation, but admits that on ED presentation she had been feeling suicidal.  Pt denies homicidal ideation, AH/VH, and paranoia.   Hospital Course: Patient was admitted to the behavioral health unit for treatment of her depression, anxiety, and illicit substance use.  Patient endorsed symptoms consistent with major depressive disorder, but also reported mood irritability and states that she had a history of hallucinations as well.  Patient reported that she would sometimes hear whispers or see shadows, but symptoms of psychosis resolved with the seroquel.   Patient was restarted on Seroquel 400 mg nightly for her mood.   Patient continued to endorse depressive symptoms but had been tried on multiple antidepressants in the past with poor efficacy.  Patient recalled being on lamotrigine years with good  benefit, thus she consented to a trial of it; however, after ~2 weeks, the patient developed a rash.  Dermatology was consulted. Dermatologist stated the rash was not consistent with Remo Lipps Johnson's syndrome or DRESS.  However, lamotrigine was discontinued as a precautionary measure.  Triamcinolone ointment utilized for inflammation and pruritis from the rash.  Patient stated that elavil had worked well for her in the past, so 25 mg elavil was started on 11/23/17.  Patient responded well to the combination of seroquel and elavil nightly.  Her mood, sleep and appetite improved.   In regards to her opioid use disorder, patient reported to the admitting psychiatrist that she had done well on suboxone in the past (however, she had not been on it since Dec 2018).  Thus, the admitting psychiatrist restarted the patient on suboxone 8-2 mg BID, but on 11/20/17 it was reduced to once daily.  Patient tolerated the medication adjustments well.   Patient is motivated to remain abstinent from illicit substances.  She expressed interest in attending inpatient substance rehab and was referred to and accepted to [facility].  Pt understands that the providers at the [facility] will not prescribe her suboxone upon discharge from their facility, and that  during the 2 week period that she'll be in rehab that she will have to work with the discharge planners to secure an outpatient provider who prescribes suboxone.   Patient was discharged in improved condition.  On day of discharge, patient denied SI, HI, AH, VH, and paranoia.    Additional Consultations: Patient was seen by ENT specialist, Dr. Steele Sizer on 11/19/17 for ear pain and provided debrox and administered an otic antibiotic empirically.  Patient's ear pain resolved.    Patient was seen by the hospitalist on 11/20/17 for abdominal pain and determined to have constipation. She was started on a bowel regimen and successfully had several bowel movements prior to discharge.  Additionally, patient was seen by a GI consultant for positive stool hemoccult.  Patient underwent a colonoscopy on 11/24/17--3 polyps were resected and patient diagnosed with internal hemorrhoids. Patient will need to follow up with the GI consultant (Dr. [facility] outpatient [facility]) in 3-4 weeks.    Patient was on synthroid 125 mcg prior to hospitalization, but this was not restarted as patient stated it made her mind feel "like it was about to explode".  The higher dose may have been too activating; however, on day of discharge, patient was provided a script for 50 mcg of synthroid daily as her TSH level had increased from 5.004 (11/16/17) to 12.004 (12/05/17). Patient was instructed to follow up with her primary care providers at the [facility] upon discharge from the [facility].     Physical Findings: AIMS: Facial and Oral Movements Muscles of Facial Expression: None, normal Lips and Perioral Area: None, normal Jaw: None,  normal Tongue: None, normal,Extremity Movements Upper (arms, wrists, hands, fingers): None, normal Lower (legs, knees, ankles, toes): None, normal,  , Overall Severity Severity of abnormal movements (highest score from questions above): None, normal Incapacitation due to abnormal movements: None, normal Patient's awareness of abnormal movements (rate only patient's report): No Awareness, Dental Status Current problems with teeth and/or dentures?: No Does patient usually wear dentures?: No   Musculoskeletal: Strength & Muscle Tone: within normal limits Gait & Station: normal Patient leans: N/A   Psychiatric Specialty Exam: Physical Exam  Nursing note and vitals reviewed.   Psychiatric Specialty Exam: Review of Systems  Constitutional: Negative for chills and fever.  HENT: Negative for ear pain and sore throat.   Eyes: Negative for pain.  Respiratory: Negative for cough and shortness of breath.   Cardiovascular: Negative for chest pain.  Gastrointestinal: Negative for abdominal pain, constipation, diarrhea and vomiting.  Genitourinary: Negative for dysuria.  Musculoskeletal: Positive for back pain (chronic ).  Skin: Positive for rash (much improved with triamcinolone ointment).  Neurological: Negative for dizziness, tremors, seizures, loss of consciousness and weakness.  Psychiatric/Behavioral: Positive for substance abuse (patient motivated for abstinence on suboxone pharmacotherapy). Negative for depression, hallucinations, memory loss and suicidal ideas. The patient is not nervous/anxious and does not have insomnia.     Blood pressure 105/82, pulse 92, temperature 97.6 F (36.4 C), temperature source Oral, resp. rate 18, height 5\' 2"  (1.575 m), weight 78 kg (172 lb), SpO2 97 %.Body mass index is 31.46 kg/m.  General Appearance: Fairly Groomed  Engineer, water::  Good  Speech:  Normal Rate  Volume:  Normal  Mood:  grateful and excited  Affect:  Full Range  Thought Process:   Coherent and Linear  Orientation:  Full (Time, Place, and Person)  Thought Content:  Logical  Suicidal Thoughts:  No  Homicidal Thoughts:  No  Memory:  intact  Judgement:  Fair  Insight:  Fair  Psychomotor Activity:  Normal  Concentration:  Good  Recall:  intact  Fund of Knowledge:Good  Language: Good  Akathisia:  No  AIMS (if indicated):     Assets:  Communication Skills Desire for Improvement Resilience Social Support  Sleep:  Number of Hours: 7.25  Cognition: WNL  ADL's:  Intact       Have you used any form of tobacco in the last 30 days? (Cigarettes, Smokeless Tobacco, Cigars, and/or Pipes): No  Has this patient used any form of tobacco in the last 30 days? (Cigarettes, Smokeless Tobacco, Cigars, and/or Pipes) Yes, No  Blood Alcohol level:  Lab Results  Component Value Date   ETH <10 11/16/2017   ETH <10 30/86/5784    Metabolic Disorder Labs:  Lab Results  Component Value Date   HGBA1C 6.1 (H) 11/16/2017   MPG 128.37 11/16/2017   No results found for: PROLACTIN Lab Results  Component Value Date   CHOL 164 11/16/2017   TRIG 117 11/16/2017   HDL 60 11/16/2017   CHOLHDL 2.7 11/16/2017   VLDL 23 11/16/2017   [place] 81 11/16/2017    See Psychiatric Specialty Exam and Suicide Risk Assessment completed by Attending Physician prior to discharge.  Discharge destination:  [facility]  Is patient on multiple antipsychotic therapies at discharge:  No     Discharge Instructions    Diet - low sodium heart healthy   Complete by:  As directed    Increase activity slowly   Complete by:  As directed      Allergies as of 12/06/2017      Reactions   Amoxicillin Rash   Effexor [venlafaxine] Rash   "whelps"   Lamotrigine Rash      Medication List    STOP taking these medications   lisinopril-hydrochlorothiazide 20-25 MG tablet Commonly known as:  PRINZIDE,ZESTORETIC   traMADol 50 MG tablet Commonly known as:  ULTRAM     TAKE these medications      Indication  albuterol 108 (90 Base) MCG/ACT inhaler Commonly known as:  PROVENTIL HFA;VENTOLIN HFA Inhale 1-2 puffs into the lungs every 6 (six) hours as needed for wheezing or shortness of breath.  Indication:  Asthma   amitriptyline 25 MG tablet  Commonly known as:  ELAVIL Take 1 tablet (25 mg total) by mouth at bedtime.  Indication:  Depression   buprenorphine-naloxone 8-2 mg Subl SL tablet Commonly known as:  SUBOXONE Place 1 tablet under the tongue daily for 14 days.  Indication:  Opioid Dependence   gabapentin 400 MG capsule Commonly known as:  NEURONTIN Take 1 capsule (400 mg total) by mouth 3 (three) times daily after meals.  Indication:  Neuropathic Pain   hydrochlorothiazide 25 MG tablet Commonly known as:  HYDRODIURIL Take 1 tablet (25 mg total) by mouth daily.  Indication:  High Blood Pressure Disorder   hydrOXYzine 50 MG tablet Commonly known as:  ATARAX/VISTARIL Take 1 tablet (50 mg total) by mouth 3 (three) times daily as needed for anxiety or itching.  Indication:  Feeling Anxious, Itching with Atopic Dermatitis   levothyroxine 50 MCG tablet Commonly known as:  SYNTHROID, LEVOTHROID Take 1 tablet (50 mcg total) by mouth daily before breakfast. What changed:    medication strength  how much to take  Indication:  Underactive Thyroid   lisinopril 5 MG tablet Commonly known as:  PRINIVIL,ZESTRIL Take 1 tablet (5 mg total) by mouth daily.  Indication:  High Blood Pressure Disorder   loratadine 10 MG tablet Commonly known as:  CLARITIN Take 1 tablet (10 mg total) by mouth daily.  Indication:  Hayfever   polyethylene glycol packet Commonly known as:  MIRALAX / GLYCOLAX Take 17 g by mouth daily.  Indication:  Constipation   QUEtiapine 400 MG tablet Commonly known as:  SEROQUEL Take 1 tablet (400 mg total) by mouth at bedtime.  Indication:  Major Depressive Disorder   triamcinolone ointment 0.1 % Commonly known as:  KENALOG Apply topically 3  (three) times daily as needed (apply to rash-lower neck, upper chest, few spots on bilateral and upper and lower extremities).  Indication:  Skin Inflammation, Inflammation      Follow-up Information    [facility]. Go on 12/06/2017.   Why:  Please arrive as close to 9am as possible. Contact information: [place] [address] [facility].   Specialty:  Gastroenterology Why:  Patient will need to call to schedule appointment with Dr. Lin Landsman, MD for follow up of colonoscopy and for biopsy results of polyps that were resected. Schedule the appointment in the next 3-4 weeks.    Contact information: [place] [id number] [phone]          Plan Of Care/Follow-up recommendations:  Activity:  as tolerated Diet:  heart healthy Other:  follow up with Gastroenterology in 3-4 weeks; follow up with Primary care provider ([facility]) for blood pressure, history of hypokalemia (but it has currently normalized -potassium of 4.2 at discharge), and hypothyroidism (TSH 12.004) in 2 weeks    Signed: [name], MD 12/15/2017, 11:54 AM

## 2017-12-06 NOTE — Progress Notes (Signed)
Patient ID: Teresa Jefferson, female   DOB: 1965-10-20, 52 y.o.   MRN: [id number]   Discharge Note:  Patient denies SI/HI/AVH at this time. Discharge instructions, AVS, transition record, and prescriptions gone over with patient. Patient agrees to comply with medication management, follow-up visit, and outpatient therapy. Patient belongings returned to patient. Patient questions and concerns addressed and answered. Patient ambulatory off unit. Patient discharged to [facility] via [facility].

## 2017-12-14 ENCOUNTER — Telehealth: Payer: Self-pay | Admitting: Gastroenterology

## 2017-12-14 NOTE — Telephone Encounter (Signed)
Left vm for pt to call office and schedule Fu with Dr. Vanga °

## 2017-12-27 ENCOUNTER — Encounter: Payer: Self-pay | Admitting: Emergency Medicine

## 2017-12-27 ENCOUNTER — Emergency Department
Admission: EM | Admit: 2017-12-27 | Discharge: 2017-12-27 | Disposition: A | Discharge status: Home / Self Care | Payer: Medicaid Other | Attending: Emergency Medicine | Admitting: Emergency Medicine

## 2017-12-27 ENCOUNTER — Emergency Department: Payer: Medicaid Other

## 2017-12-27 ENCOUNTER — Other Ambulatory Visit: Payer: Self-pay

## 2017-12-27 DIAGNOSIS — Z79899 Other long term (current) drug therapy: Secondary | ICD-10-CM | POA: Diagnosis not present

## 2017-12-27 DIAGNOSIS — N10 Acute tubulo-interstitial nephritis: Secondary | ICD-10-CM | POA: Diagnosis not present

## 2017-12-27 DIAGNOSIS — J45909 Unspecified asthma, uncomplicated: Secondary | ICD-10-CM | POA: Insufficient documentation

## 2017-12-27 DIAGNOSIS — N12 Tubulo-interstitial nephritis, not specified as acute or chronic: Secondary | ICD-10-CM

## 2017-12-27 DIAGNOSIS — I10 Essential (primary) hypertension: Secondary | ICD-10-CM | POA: Diagnosis not present

## 2017-12-27 DIAGNOSIS — R1084 Generalized abdominal pain: Secondary | ICD-10-CM | POA: Diagnosis present

## 2017-12-27 LAB — URINALYSIS, COMPLETE (UACMP) WITH MICROSCOPIC
Bilirubin Urine: NEGATIVE
Glucose, UA: NEGATIVE mg/dL
Hgb urine dipstick: NEGATIVE
Ketones, ur: NEGATIVE mg/dL
Nitrite: NEGATIVE
PROTEIN: NEGATIVE mg/dL
SPECIFIC GRAVITY, URINE: 1.012 (ref 1.005–1.030)
WBC, UA: >50 WBC/hpf — ABNORMAL HIGH (ref 0–5)
pH: 5 (ref 5.0–8.0)

## 2017-12-27 LAB — BASIC METABOLIC PANEL
Anion gap: 6 (ref 5–15)
BUN: 20 mg/dL (ref 6–20)
CO2: 24 mmol/L (ref 22–32)
Calcium: 9.1 mg/dL (ref 8.9–10.3)
Chloride: 112 mmol/L — ABNORMAL HIGH (ref 98–111)
Creatinine, Ser: 1 mg/dL (ref 0.44–1.00)
Glucose, Bld: 144 mg/dL — ABNORMAL HIGH (ref 70–99)
Potassium: 4.1 mmol/L (ref 3.5–5.1)
SODIUM: 142 mmol/L (ref 135–145)

## 2017-12-27 LAB — CBC
HCT: 37.8 % (ref 35.0–47.0)
Hemoglobin: 12.5 g/dL (ref 12.0–16.0)
MCH: 27.8 pg (ref 26.0–34.0)
MCHC: 33.2 g/dL (ref 32.0–36.0)
MCV: 83.7 fL (ref 80.0–100.0)
Platelets: 210 10*3/uL (ref 150–440)
RBC: 4.51 MIL/uL (ref 3.80–5.20)
RDW: 14.4 % (ref 11.5–14.5)
WBC: 8.4 10*3/uL (ref 3.6–11.0)

## 2017-12-27 MED ORDER — IBUPROFEN 400 MG PO TABS: 600.0000 mg | ORAL_TABLET | Freq: Once | ORAL | Status: DC

## 2017-12-27 MED ORDER — IBUPROFEN 600 MG PO TABS: 600.0000 mg | ORAL_TABLET | Freq: Four times a day (QID) | ORAL | 0 refills | Status: DC | PRN

## 2017-12-27 MED ORDER — KETOROLAC TROMETHAMINE 30 MG/ML IJ SOLN
INTRAMUSCULAR | Status: AC
  Filled 2017-12-27: qty 1

## 2017-12-27 MED ORDER — ONDANSETRON 4 MG PO TBDP
4.0000 mg | ORAL_TABLET | Freq: Once | ORAL | Status: AC
  Administered 2017-12-27: 4 mg via ORAL
  Filled 2017-12-27: qty 1

## 2017-12-27 MED ORDER — KETOROLAC TROMETHAMINE 60 MG/2ML IM SOLN
30.0000 mg | Freq: Once | INTRAMUSCULAR | Status: AC
  Administered 2017-12-27: 30 mg via INTRAMUSCULAR
  Filled 2017-12-27: qty 2

## 2017-12-27 MED ORDER — CEPHALEXIN 500 MG PO CAPS: 500.0000 mg | ORAL_CAPSULE | Freq: Three times a day (TID) | ORAL | 0 refills | Status: AC

## 2017-12-27 MED ORDER — CEFTRIAXONE SODIUM 1 G IJ SOLR
1.0000 g | Freq: Once | INTRAMUSCULAR | Status: AC
  Administered 2017-12-27: 1 g via INTRAMUSCULAR
  Filled 2017-12-27: qty 10

## 2017-12-27 MED ORDER — CEPHALEXIN 500 MG PO CAPS: 500.0000 mg | ORAL_CAPSULE | Freq: Once | ORAL | Status: DC

## 2017-12-27 MED ORDER — KETOROLAC TROMETHAMINE 30 MG/ML IJ SOLN
30.0000 mg | Freq: Once | INTRAMUSCULAR | Status: AC
  Administered 2017-12-27: 30 mg via INTRAVENOUS

## 2017-12-27 MED ORDER — ONDANSETRON 4 MG PO TBDP: 4.0000 mg | ORAL_TABLET | Freq: Three times a day (TID) | ORAL | 0 refills | Status: DC | PRN

## 2017-12-27 NOTE — ED Provider Notes (Signed)
[facility] Emergency Department Provider Note  ____________________________________________  Time seen: Approximately 2:24 PM  I have reviewed the triage vital signs and the nursing notes.   HISTORY  Chief Complaint Flank Pain and Headache   HPI Teresa Jefferson is a 52 y.o. female with history of schizophrenia, bipolar, hypertension, hypothyroidism who presents for evaluation of right-sided flank and abdominal pain.  Patient reports R flank and R sided abd pain, sharp constant since last night, 9/10 associated with nausea, chills, difficulty initiating urine. No h/o kidney stone. Low grade fever 100.57F yesterday. No diarrhea, constipation, vomiting, dysuria, vaginal discharge. Patient has had this pain intermittent, maybe twice before in the last 3 months. Several abdominal surgeries including hysterectomy, bilateral oophorectomy, cholecystectomy, appendectomy. Taking ibuprofen and tylenol with no relief. Also complaining of HA, diffuse, sharp since this am. No thunderclap HA.  Past Medical History:  Diagnosis Date  . Bipolar 1 disorder ([place])   . Depression   . Hypertension   . Neuropathy   . Schizophrenia ([place])   . Thyroid disease     Patient Active Problem List   Diagnosis Date Noted  . MDD (major depressive disorder), recurrent, severe, with psychosis ([place]) 12/05/2017  . Essential hypertension 11/17/2017  . Asthma 11/17/2017  . 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 Cocaine use disorder, severe, dependence ([place]) 11/17/2017  . Opioid use disorder, severe, dependence ([place]) 11/17/2017  . Severe episode of recurrent major depressive disorder, without psychotic features ([place]) 11/16/2017  . Opiate abuse, continuous ([place]) 11/16/2017  . Cocaine abuse ([place]) 11/16/2017  . Suicidal ideation 11/16/2017  . Chronic pain 11/16/2017    Past Surgical History:  Procedure Laterality Date  . ABDOMINAL HYSTERECTOMY    .  COLONOSCOPY N/A 11/23/2017   Procedure: COLONOSCOPY;  Surgeon: Lin Landsman, MD;  Location: [facility] ENDOSCOPY;  Service: Gastroenterology;  Laterality: N/A;  . COLONOSCOPY N/A 11/24/2017   Procedure: COLONOSCOPY;  Surgeon: Lin Landsman, MD;  Location: [facility] ENDOSCOPY;  Service: Gastroenterology;  Laterality: N/A;  . KNEE SURGERY Right   . TONSILLECTOMY      Prior to Admission medications   Medication Sig Start Date End Date Taking? Authorizing Provider  albuterol (PROVENTIL HFA;VENTOLIN HFA) 108 (90 Base) MCG/ACT inhaler Inhale 1-2 puffs into the lungs every 6 (six) hours as needed for wheezing or shortness of breath. 12/06/17   [name], MD  amitriptyline (ELAVIL) 25 MG tablet Take 1 tablet (25 mg total) by mouth at bedtime. 12/06/17   [name], MD  cephALEXin (KEFLEX) 500 MG capsule Take 1 capsule (500 mg total) by mouth 3 (three) times daily for 7 days. 12/27/17 01/03/18  Rudene Re, MD  gabapentin (NEURONTIN) 400 MG capsule Take 1 capsule (400 mg total) by mouth 3 (three) times daily after meals. 12/06/17   [name], MD  hydrochlorothiazide (HYDRODIURIL) 25 MG tablet Take 1 tablet (25 mg total) by mouth daily. 12/06/17   [name], MD  hydrOXYzine (ATARAX/VISTARIL) 50 MG tablet Take 1 tablet (50 mg total) by mouth 3 (three) times daily as needed for anxiety or itching. 12/06/17   [name], MD  ibuprofen (ADVIL,MOTRIN) 600 MG tablet Take 1 tablet (600 mg total) by mouth every 6 (six) hours as needed. 12/27/17   Rudene Re, MD  levothyroxine (SYNTHROID, LEVOTHROID) 50 MCG tablet Take 1 tablet (50 mcg total) by mouth daily before breakfast. 12/06/17   [name], MD  lisinopril (PRINIVIL,ZESTRIL) 5 MG tablet Take 1 tablet (5 mg total) by  mouth daily. 12/06/17   [name], MD  loratadine (CLARITIN) 10 MG tablet Take 1 tablet (10 mg total) by mouth daily. 12/06/17   [name], MD  ondansetron (ZOFRAN ODT) 4 MG disintegrating tablet Take 1  tablet (4 mg total) by mouth every 8 (eight) hours as needed for nausea or vomiting. 12/27/17   Alfred Levins, [place], MD  polyethylene glycol [facility] / Floria Raveling) packet Take 17 g by mouth daily. 12/06/17   [name], MD  QUEtiapine (SEROQUEL) 400 MG tablet Take 1 tablet (400 mg total) by mouth at bedtime. 12/06/17   [name], MD  triamcinolone ointment (KENALOG) 0.1 % Apply topically 3 (three) times daily as needed (apply to rash-lower neck, upper chest, few spots on bilateral and upper and lower extremities). 12/06/17   [name], MD    Allergies Amoxicillin; Effexor [venlafaxine]; and Lamotrigine  No family history on file.  Social History Social History   Tobacco Use  . Smoking status: Never Smoker  . Smokeless tobacco: Never Used  Substance Use Topics  . Alcohol use: No    Comment: occ  . Drug use: Yes    Comment: crack and opiates    Review of Systems  Constitutional: + fever. Eyes: Negative for visual changes. ENT: Negative for sore throat. Neck: No neck pain  Cardiovascular: Negative for chest pain. Respiratory: Negative for shortness of breath. Gastrointestinal: Negative for vomiting or diarrhea. + nausea and R sided abdominal pain Genitourinary: Negative for dysuria. + R flank pain Musculoskeletal: Negative for back pain. Skin: Negative for rash. Neurological: Negative for headaches, weakness or numbness. Psych: No SI or HI  ____________________________________________   PHYSICAL EXAM:  VITAL SIGNS: ED Triage Vitals  Enc Vitals Group     BP 12/27/17 1326 105/74     Pulse Rate 12/27/17 1326 98     Resp 12/27/17 1326 16     Temp 12/27/17 1326 99 F (37.2 C)     Temp Source 12/27/17 1326 Oral     SpO2 12/27/17 1326 98 %     Weight 12/27/17 1327 176 lb (79.8 kg)     Height 12/27/17 1327 5\' 2"  (1.575 m)     Head Circumference --      Peak Flow --      Pain Score 12/27/17 1327 7     Pain Loc --      Pain Edu? --      Excl. in [place]? --      Constitutional: Alert and oriented. Well appearing and in no apparent distress. HEENT:      Head: Normocephalic and atraumatic.         Eyes: Conjunctivae are normal. Sclera is non-icteric.       Mouth/Throat: Mucous membranes are moist.       Neck: Supple with no signs of meningismus. Cardiovascular: Regular rate and rhythm. No murmurs, gallops, or rubs. 2+ symmetrical distal pulses are present in all extremities. No JVD. Respiratory: Normal respiratory effort. Lungs are clear to auscultation bilaterally. No wheezes, crackles, or rhonchi.  Gastrointestinal: Soft, diffuse R sided tenderness to palpation, and non distended with positive bowel sounds. No rebound or guarding. Genitourinary: No CVA tenderness. Musculoskeletal: Nontender with normal range of motion in all extremities. No edema, cyanosis, or erythema of extremities. Neurologic: Normal speech and language. Face is symmetric. Moving all extremities. No gross focal neurologic deficits are appreciated. Skin: Skin is warm, dry and intact. No rash noted. Psychiatric: Mood and affect are normal. Speech and behavior are normal.  ____________________________________________   LABS (all labs ordered are listed, but only abnormal results are displayed)  Labs Reviewed  URINALYSIS, COMPLETE (UACMP) WITH MICROSCOPIC - Abnormal; Notable for the following components:      Result Value   Color, Urine YELLOW (*)    APPearance HAZY (*)    Leukocytes, UA LARGE (*)    WBC, UA >50 (*)    Bacteria, UA MANY (*)    All other components within normal limits  BASIC METABOLIC PANEL - Abnormal; Notable for the following components:   Chloride 112 (*)    Glucose, Bld 144 (*)    All other components within normal limits  URINE CULTURE  CBC   ____________________________________________  EKG  none  ____________________________________________  RADIOLOGY  I have personally reviewed the images performed during this visit and I agree with  the Radiologist's read.   Interpretation by Radiologist:  Ct Renal Stone Study  Result Date: 12/27/2017 CLINICAL DATA:  Initial evaluation for acute right-sided flank pain. EXAM: CT ABDOMEN AND PELVIS WITHOUT CONTRAST TECHNIQUE: Multidetector CT imaging of the abdomen and pelvis was performed following the standard protocol without IV contrast. COMPARISON:  Prior radiograph from 11/20/2017. FINDINGS: Lower chest: Few scattered subcentimeter nodules present within the lung bases. 4 mm pleural base nodule at the right middle lobe (series 4, image 2). 7 mm nodular density along the right hemidiaphragm (series 4, image 9). Additional few scattered right lower lobe nodules measuring up to 6 mm. 4 mm left lower lobe pleural base nodule (series 4, image 19). Findings are indeterminate. Visualized lungs are otherwise clear. Hepatobiliary: Few scattered subcentimeter hypodensities within the liver too small the characterize by CT, but of doubtful significance. Limited noncontrast evaluation of the liver otherwise unremarkable gallbladder surgically absent. No biliary dilatation. Pancreas: Pancreas within normal limits. Spleen: Spleen within normal limits. Adrenals/Urinary Tract: Adrenal glands are normal. Left kidney unremarkable without nephrolithiasis or hydronephrosis. No radiopaque calculi seen along the course of the left renal collecting system. No left-sided hydroureter. On the right, subtle hazy fat stranding about the right kidney and proximal to mid right ureter, with mild asymmetric fullness of the right renal collecting system. No radiopaque calculi seen within the right kidney or along the course of the right renal collecting system. Findings are nonspecific, and could reflect sequelae of a recently passed stone. Possible infection could also be considered. Partially distended bladder within normal limits no layering stones within the bladder lumen. Stomach/Bowel: Stomach within normal limits. No evidence  for bowel obstruction. Moderate to large volume retained stool throughout the colon, which could reflect constipation. No acute inflammatory changes about the bowels. No findings to suggest acute appendicitis. Vascular/Lymphatic: Intra-abdominal aorta of normal caliber. No adenopathy. Reproductive: Uterus absent.  Ovaries not discretely identified. Other: No free air or fluid. Musculoskeletal: No acute osseous abnormality. No worrisome lytic or blastic osseous lesions. Bilateral facet arthropathy noted within the lower lumbar spine. IMPRESSION: 1. Mild hazy fat stranding about the right kidney and proximal-mid right ureter, with no obstructive stone identified. Findings are nonspecific, and could reflect sequelae of a recently passed stone. Possible infection could also be considered. Correlation with urinalysis recommended. 2. No other acute intra-abdominal or pelvic process identified. 3. Moderate to large volume stool throughout the colon, suggesting constipation. Electronically Signed   By: Jeannine Boga M.D.   On: 12/27/2017 15:26     ____________________________________________   PROCEDURES  Procedure(s) performed: None Procedures Critical Care performed:  None ____________________________________________   INITIAL IMPRESSION / ASSESSMENT AND  PLAN / ED COURSE  52 y.o. female with history of schizophrenia, bipolar, hypertension, hypothyroidism who presents for evaluation of right-sided flank and abdominal pain.  UA positive for UTI, CT renal done to rule out obstructing stone which is negative.  There is evidence of fat stranding around the right kidney consistent with pyelonephritis.  No signs of sepsis, patient is afebrile with a normal white count.  Urine culture has been sent.  We will start patient on Keflex.  Will give a dose of IM Rocephin.  Will discharge home with follow-up with primary care doctor.  Discussed return precautions for signs of sepsis or worsening pyelo. Patient  given toradol and zofran for symptoms relief.       As part of my medical decision making, I reviewed the following data within the [place] notes reviewed and incorporated, Labs reviewed , Old chart reviewed, Radiograph reviewed , Notes from prior ED visits and [place] Controlled Substance Database    Pertinent labs & imaging results that were available during my care of the patient were reviewed by me and considered in my medical decision making (see chart for details).    ____________________________________________   FINAL CLINICAL IMPRESSION(S) / ED DIAGNOSES  Final diagnoses:  Pyelonephritis      NEW MEDICATIONS STARTED DURING THIS VISIT:  ED Discharge Orders         Ordered    cephALEXin (KEFLEX) 500 MG capsule  3 times daily     12/27/17 1622    ibuprofen (ADVIL,MOTRIN) 600 MG tablet  Every 6 hours PRN     12/27/17 1622    ondansetron (ZOFRAN ODT) 4 MG disintegrating tablet  Every 8 hours PRN     12/27/17 1622           Note:  This document was prepared using Dragon voice recognition software and may include unintentional dictation errors.    Alfred Levins, [place], MD 12/27/17 [phone]

## 2017-12-27 NOTE — ED Triage Notes (Signed)
Patient reports right-sided flank pain and headache that started last night. Reports fever at home. Patient also states nausea.

## 2017-12-29 LAB — URINE CULTURE

## 2018-02-26 ENCOUNTER — Encounter: Payer: Self-pay | Admitting: Gastroenterology

## 2018-02-26 ENCOUNTER — Ambulatory Visit: Payer: Self-pay | Admitting: Gastroenterology

## 2018-02-26 ENCOUNTER — Other Ambulatory Visit: Payer: Self-pay

## 2018-02-26 DIAGNOSIS — R195 Other fecal abnormalities: Secondary | ICD-10-CM

## 2018-05-14 ENCOUNTER — Encounter: Payer: Self-pay | Admitting: Intensive Care

## 2018-05-14 ENCOUNTER — Emergency Department
Admission: EM | Admit: 2018-05-14 | Discharge: 2018-05-14 | Disposition: A | Discharge status: Home / Self Care | Payer: Medicaid Other | Attending: Student in an Organized Health Care Education/Training Program | Admitting: Student in an Organized Health Care Education/Training Program

## 2018-05-14 ENCOUNTER — Other Ambulatory Visit: Payer: Self-pay

## 2018-05-14 ENCOUNTER — Emergency Department: Payer: Medicaid Other

## 2018-05-14 DIAGNOSIS — R509 Fever, unspecified: Secondary | ICD-10-CM | POA: Insufficient documentation

## 2018-05-14 DIAGNOSIS — J9801 Acute bronchospasm: Secondary | ICD-10-CM | POA: Diagnosis not present

## 2018-05-14 DIAGNOSIS — R05 Cough: Secondary | ICD-10-CM | POA: Diagnosis present

## 2018-05-14 DIAGNOSIS — I10 Essential (primary) hypertension: Secondary | ICD-10-CM | POA: Diagnosis not present

## 2018-05-14 DIAGNOSIS — R062 Wheezing: Secondary | ICD-10-CM | POA: Diagnosis not present

## 2018-05-14 DIAGNOSIS — R059 Cough, unspecified: Secondary | ICD-10-CM

## 2018-05-14 DIAGNOSIS — E039 Hypothyroidism, unspecified: Secondary | ICD-10-CM | POA: Insufficient documentation

## 2018-05-14 DIAGNOSIS — Z79899 Other long term (current) drug therapy: Secondary | ICD-10-CM | POA: Diagnosis not present

## 2018-05-14 MED ORDER — DEXAMETHASONE SODIUM PHOSPHATE 10 MG/ML IJ SOLN
10.0000 mg | Freq: Once | INTRAMUSCULAR | Status: AC
  Administered 2018-05-14: 10 mg via INTRAMUSCULAR
  Filled 2018-05-14: qty 1

## 2018-05-14 MED ORDER — HYDROCOD POLST-CPM POLST ER 10-8 MG/5ML PO SUER
5.0000 mL | Freq: Once | ORAL | Status: AC
  Administered 2018-05-14: 5 mL via ORAL
  Filled 2018-05-14: qty 5

## 2018-05-14 MED ORDER — METHYLPREDNISOLONE 4 MG PO TBPK: ORAL_TABLET | ORAL | 0 refills | Status: DC

## 2018-05-14 MED ORDER — BENZONATATE 100 MG PO CAPS: 200.0000 mg | ORAL_CAPSULE | Freq: Three times a day (TID) | ORAL | 0 refills | Status: DC | PRN

## 2018-05-14 MED ORDER — ALBUTEROL SULFATE HFA 108 (90 BASE) MCG/ACT IN AERS: 1.0000 | INHALATION_SPRAY | Freq: Four times a day (QID) | RESPIRATORY_TRACT | 0 refills | Status: DC | PRN

## 2018-05-14 MED ORDER — IPRATROPIUM-ALBUTEROL 0.5-2.5 (3) MG/3ML IN SOLN
3.0000 mL | Freq: Once | RESPIRATORY_TRACT | Status: AC
  Administered 2018-05-14: 3 mL via RESPIRATORY_TRACT
  Filled 2018-05-14: qty 3

## 2018-05-14 MED ORDER — TRAMADOL HCL 50 MG PO TABS: 50.0000 mg | ORAL_TABLET | Freq: Two times a day (BID) | ORAL | 0 refills | Status: DC | PRN

## 2018-05-14 NOTE — ED Notes (Signed)
Reference triage note. Pt states body aches and head ache are a 9/10 right now. Pt states "my throat feels like its getting tight when I lay down at night". Pt currently in NAD, oxygen at 97% on room air. This RN will continue to monitor.

## 2018-05-14 NOTE — ED Triage Notes (Addendum)
Patient c/o productive cough, wheezing, chills/sweats, and fever X2 days. Also c/o upper back pain. EKG completed in Triage.

## 2018-05-14 NOTE — ED Provider Notes (Signed)
[facility] Emergency Department Provider Note   ____________________________________________   First MD Initiated Contact with Patient 05/14/18 1108     (approximate)  I have reviewed the triage vital signs and the nursing notes.   HISTORY  Chief Complaint Cough and Influenza    HPI Teresa Jefferson is a 52 y.o. female patient presents productive cough, wheezing, chills/sweats and fever for 2 days.  Patient also complained of a back pain secondary to continue coughing.  Patient denies shortness of breath.  Patient denies nausea, vomiting, diarrhea.  No palliative measures prior to arrival.  Patient was given nebulized treatment in triage.  Patient rates her pain discomfort 8/10.  Patient described pain is "achy".  Past Medical History:  Diagnosis Date  . Bipolar 1 disorder ([place])   . Depression   . Hypertension   . Neuropathy   . Schizophrenia ([place])   . Thyroid disease     Patient Active Problem List   Diagnosis Date Noted  . MDD (major depressive disorder), recurrent, severe, with psychosis ([place]) 12/05/2017  . Essential hypertension 11/17/2017  . Asthma 11/17/2017  . 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 Cocaine use disorder, severe, dependence ([place]) 11/17/2017  . Opioid use disorder, severe, dependence ([place]) 11/17/2017  . Severe episode of recurrent major depressive disorder, without psychotic features ([place]) 11/16/2017  . Opiate abuse, continuous ([place]) 11/16/2017  . Cocaine abuse ([place]) 11/16/2017  . Suicidal ideation 11/16/2017  . Chronic pain 11/16/2017    Past Surgical History:  Procedure Laterality Date  . ABDOMINAL HYSTERECTOMY    . COLONOSCOPY N/A 11/23/2017   Procedure: COLONOSCOPY;  Surgeon: Lin Landsman, MD;  Location: [facility] ENDOSCOPY;  Service: Gastroenterology;  Laterality: N/A;  . COLONOSCOPY N/A 11/24/2017   Procedure: COLONOSCOPY;  Surgeon: Lin Landsman, MD;  Location: [facility]  ENDOSCOPY;  Service: Gastroenterology;  Laterality: N/A;  . KNEE SURGERY Right   . TONSILLECTOMY      Prior to Admission medications   Medication Sig Start Date End Date Taking? Authorizing Provider  albuterol (PROVENTIL HFA;VENTOLIN HFA) 108 (90 Base) MCG/ACT inhaler Inhale 1-2 puffs into the lungs every 6 (six) hours as needed for wheezing or shortness of breath. 05/14/18   Sable Feil, PA-C  ALPRAZolam Duanne Moron) 0.5 MG tablet Take by mouth.    [provider]  ALPRAZolam Duanne Moron) 1 MG tablet Take by mouth.    [provider]  amitriptyline (ELAVIL) 25 MG tablet Take 1 tablet (25 mg total) by mouth at bedtime. 12/06/17   [name], MD  benzonatate (TESSALON PERLES) 100 MG capsule Take 2 capsules (200 mg total) by mouth 3 (three) times daily as needed. 05/14/18 05/14/19  Sable Feil, PA-C  gabapentin (NEURONTIN) 300 MG capsule Take by mouth. 05/09/10   [provider]  gabapentin (NEURONTIN) 400 MG capsule Take 1 capsule (400 mg total) by mouth 3 (three) times daily after meals. 12/06/17   [name], MD  hydrochlorothiazide (HYDRODIURIL) 25 MG tablet Take 1 tablet (25 mg total) by mouth daily. 12/06/17   [name], MD  HYDROcodone-acetaminophen (NORCO) 10-325 MG tablet Take by mouth. 10/18/12   [provider]  hydrOXYzine (ATARAX/VISTARIL) 50 MG tablet Take 1 tablet (50 mg total) by mouth 3 (three) times daily as needed for anxiety or itching. 12/06/17   [name], MD  ibuprofen (ADVIL,MOTRIN) 600 MG tablet Take 1 tablet (600 mg total) by mouth every 6 (six) hours as needed. 12/27/17   Rudene Re, MD  levothyroxine (SYNTHROID, LEVOTHROID) 100 MCG tablet Take by mouth.    [provider]  levothyroxine (SYNTHROID, LEVOTHROID) 50 MCG tablet Take 1 tablet (50 mcg total) by mouth daily before breakfast. 12/06/17   [name], MD  lisinopril (PRINIVIL,ZESTRIL) 20 MG tablet Take by mouth.    [provider]    lisinopril (PRINIVIL,ZESTRIL) 5 MG tablet Take 1 tablet (5 mg total) by mouth daily. 12/06/17   [name], MD  lisinopril-hydrochlorothiazide (PRINZIDE,ZESTORETIC) 20-25 MG tablet Take by mouth.    [provider]  loratadine (CLARITIN) 10 MG tablet Take 1 tablet (10 mg total) by mouth daily. 12/06/17   [name], MD  methylPREDNISolone (MEDROL DOSEPAK) 4 MG TBPK tablet Take [place] dose as directed 05/14/18   Sable Feil, PA-C  ondansetron (ZOFRAN ODT) 4 MG disintegrating tablet Take 1 tablet (4 mg total) by mouth every 8 (eight) hours as needed for nausea or vomiting. 12/27/17   Alfred Levins, [place], MD  oxyCODONE (OXY IR/ROXICODONE) 5 MG immediate release tablet Take by mouth. 07/18/14   [provider]  Oxycodone HCl 10 MG TABS Take by mouth. 10/10/12   [provider]  polyethylene glycol (MIRALAX / GLYCOLAX) packet Take 17 g by mouth daily. 12/06/17   [name], MD  promethazine (PHENERGAN) 12.5 MG tablet Take by mouth. 10/04/12   [provider]  QUEtiapine (SEROQUEL) 200 MG tablet Take by mouth.    [provider]  QUEtiapine (SEROQUEL) 400 MG tablet Take 1 tablet (400 mg total) by mouth at bedtime. 12/06/17   [name], MD  traMADol (ULTRAM) 50 MG tablet Take by mouth.    [provider]  triamcinolone ointment (KENALOG) 0.1 % Apply topically 3 (three) times daily as needed (apply to rash-lower neck, upper chest, few spots on bilateral and upper and lower extremities). 12/06/17   [name], MD    Allergies   History reviewed. No pertinent family history.  Social History Social History   Tobacco Use  . Smoking status: Never Smoker  . Smokeless tobacco: Never Used  Substance Use Topics  . Alcohol use: No  . Drug use: Not Currently    Comment: crack and opiates    Review of Systems Constitutional: No fever/chills Eyes: No visual changes. ENT: No sore throat. Cardiovascular: Denies chest  pain. Respiratory: Denies shortness of breath.  Productive cough. Musculoskeletal: Positive upper back pain for back pain. Skin: Negative for rash. Neurological: Negative for headaches, focal weakness or numbness. Psychiatric:Bipolar, depression, schizophrenia and drug abuse. Endocrine:Hypertension Hematological/Lymphatic: Allergic/Immunilogical: See medication list.  ____________________________________________   PHYSICAL EXAM:  VITAL SIGNS: ED Triage Vitals  Enc Vitals Group     BP 05/14/18 1033 (!) 167/109     Pulse Rate 05/14/18 1033 80     Resp 05/14/18 1033 20     Temp 05/14/18 1033 98.7 F (37.1 C)     Temp Source 05/14/18 1033 Oral     SpO2 05/14/18 1033 97 %     Weight 05/14/18 1034 158 lb 8.2 oz (71.9 kg)     Height 05/14/18 1034 5\' 2"  (1.575 m)     Head Circumference --      Peak Flow --      Pain Score 05/14/18 1033 8     Pain Loc --      Pain Edu? --      Excl. in [place]? --    Constitutional: Alert and oriented. Well appearing and in no acute distress. Nose: No congestion/rhinnorhea. Mouth/Throat: Mucous membranes  are moist.  Oropharynx non-erythematous. Neck: No stridor.  Cardiovascular: Normal rate, regular rhythm. Grossly normal heart sounds.  Good peripheral circulation.  Evaded blood pressure. Respiratory: Normal respiratory effort.  No retractions. Lungs CTAB. Gastrointestinal: Soft and nontender. No distention. No abdominal bruits. No CVA tenderness. Musculoskeletal: No lower extremity tenderness nor edema.  No joint effusions. Neurologic:  Normal speech and language. No gross focal neurologic deficits are appreciated. No gait instability. Skin:  Skin is warm, dry and intact. No rash noted. Psychiatric: Mood and affect are normal. Speech and behavior are normal.  ____________________________________________   LABS (all labs ordered are listed, but only abnormal results are displayed)  Labs Reviewed - No data to  display ____________________________________________  EKG  EKG read by heart station.with no acute findings. ____________________________________________  RADIOLOGY  ED MD interpretation:  Official radiology report(s): Dg Chest 2 View  Result Date: 05/14/2018 CLINICAL DATA:  Productive cough.  Wheezing. EXAM: CHEST - 2 VIEW COMPARISON:  11/09/2016 FINDINGS: The heart size and mediastinal contours are within normal limits. Both lungs are clear. The visualized skeletal structures are unremarkable. IMPRESSION: No active cardiopulmonary disease. Electronically Signed   By: Lorriane Shire M.D.   On: 05/14/2018 11:32    ____________________________________________   PROCEDURES  Procedure(s) performed: None  Procedures  Critical Care performed: No  ____________________________________________   INITIAL IMPRESSION / ASSESSMENT AND PLAN / ED COURSE  As part of my medical decision making, I reviewed the following data within the [place]    Patient presents with fever, cough, wheezing for 2 days.  Patient received a DuoNeb treatment which she states only increase her cough.  Discussed negative chest x-ray findings with patient.  Patient is diagnosed with cough secondary bronchospasm.  Patient given Tussionex and Decadron prior to departure.  Patient given a prescription for Medrol Dosepak and Tessalon Perles.  Patient advised follow-up PCP.      ____________________________________________   FINAL CLINICAL IMPRESSION(S) / ED DIAGNOSES  Final diagnoses:  Cough due to bronchospasm     ED Discharge Orders         Ordered    benzonatate (TESSALON PERLES) 100 MG capsule  3 times daily PRN     05/14/18 1206    methylPREDNISolone (MEDROL DOSEPAK) 4 MG TBPK tablet     05/14/18 1206    albuterol (PROVENTIL HFA;VENTOLIN HFA) 108 (90 Base) MCG/ACT inhaler  Every 6 hours PRN     05/14/18 1208           Note:  This document was prepared using Dragon  voice recognition software and may include unintentional dictation errors.    Sable Feil, PA-C 05/14/18 1223    Merlyn Lot, MD 05/14/18 [phone]

## 2018-05-14 NOTE — Discharge Instructions (Addendum)
If cough persists follow-up with family doctor to consider lisinopril as a trigger for your cough.

## 2018-05-15 ENCOUNTER — Emergency Department
Admission: EM | Admit: 2018-05-15 | Discharge: 2018-05-15 | Disposition: A | Discharge status: Home / Self Care | Payer: Medicaid Other | Attending: Emergency Medicine | Admitting: Emergency Medicine

## 2018-05-15 ENCOUNTER — Other Ambulatory Visit: Payer: Self-pay

## 2018-05-15 ENCOUNTER — Encounter: Payer: Self-pay | Admitting: Emergency Medicine

## 2018-05-15 DIAGNOSIS — F419 Anxiety disorder, unspecified: Secondary | ICD-10-CM | POA: Diagnosis not present

## 2018-05-15 DIAGNOSIS — R51 Headache: Secondary | ICD-10-CM | POA: Insufficient documentation

## 2018-05-15 DIAGNOSIS — J209 Acute bronchitis, unspecified: Secondary | ICD-10-CM | POA: Diagnosis not present

## 2018-05-15 DIAGNOSIS — I10 Essential (primary) hypertension: Secondary | ICD-10-CM | POA: Diagnosis not present

## 2018-05-15 DIAGNOSIS — Z79899 Other long term (current) drug therapy: Secondary | ICD-10-CM | POA: Diagnosis not present

## 2018-05-15 DIAGNOSIS — R05 Cough: Secondary | ICD-10-CM | POA: Diagnosis present

## 2018-05-15 DIAGNOSIS — J45909 Unspecified asthma, uncomplicated: Secondary | ICD-10-CM | POA: Diagnosis not present

## 2018-05-15 LAB — INFLUENZA PANEL BY PCR (TYPE A & B)
Influenza A By PCR: NEGATIVE
Influenza B By PCR: NEGATIVE

## 2018-05-15 MED ORDER — HYDROCOD POLST-CPM POLST ER 10-8 MG/5ML PO SUER
5.0000 mL | Freq: Once | ORAL | Status: AC
  Administered 2018-05-15: 5 mL via ORAL
  Filled 2018-05-15: qty 5

## 2018-05-15 MED ORDER — KETOROLAC TROMETHAMINE 30 MG/ML IJ SOLN
30.0000 mg | Freq: Once | INTRAMUSCULAR | Status: AC
  Administered 2018-05-15: 30 mg via INTRAMUSCULAR
  Filled 2018-05-15: qty 1

## 2018-05-15 MED ORDER — AZITHROMYCIN 500 MG PO TABS
500.0000 mg | ORAL_TABLET | Freq: Once | ORAL | Status: AC
  Administered 2018-05-15: 500 mg via ORAL
  Filled 2018-05-15: qty 1

## 2018-05-15 MED ORDER — AZITHROMYCIN 500 MG PO TABS: 500.0000 mg | ORAL_TABLET | Freq: Every day | ORAL | 0 refills | Status: AC

## 2018-05-15 NOTE — ED Notes (Signed)
Pt brought to [facility] and sts, "Can you ask the doctor to give me pain medication for my headache. I don't want no tylenol or ibuprofen."

## 2018-05-15 NOTE — ED Triage Notes (Addendum)
Pt presents to ED with productive cough and states she has noticed blood in her sputum. Seen in this ED yesterday for similar symptoms. Reports severe headache and pain down the right side of her neck. Pt states the cough medication prescribed yesterday is not helping with her symptoms. States she has been feeling badly about 3-4 days.

## 2018-05-17 NOTE — ED Provider Notes (Signed)
[facility] Emergency Department Provider Note    First MD Initiated Contact with Patient 05/15/18 2210     (approximate)  I have reviewed the triage vital signs and the nursing notes.   HISTORY  Chief Complaint Cough; Anxiety; and Headache    HPI Teresa Jefferson is a 53 y.o. female with below list of chronic medical conditions returns to the emergency department after being evaluated and diagnosed with bronchospasms yesterday secondary to persistent cough.  Patient afebrile on presentation temperature 98.4   Past Medical History:  Diagnosis Date  . Bipolar 1 disorder ([place])   . Depression   . Hypertension   . Neuropathy   . Schizophrenia ([place])   . Thyroid disease     Patient Active Problem List   Diagnosis Date Noted  . MDD (major depressive disorder), recurrent, severe, with psychosis ([place]) 12/05/2017  . Essential hypertension 11/17/2017  . Asthma 11/17/2017  . 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 Cocaine use disorder, severe, dependence ([place]) 11/17/2017  . Opioid use disorder, severe, dependence ([place]) 11/17/2017  . Severe episode of recurrent major depressive disorder, without psychotic features ([place]) 11/16/2017  . Opiate abuse, continuous ([place]) 11/16/2017  . Cocaine abuse ([place]) 11/16/2017  . Suicidal ideation 11/16/2017  . Chronic pain 11/16/2017    Past Surgical History:  Procedure Laterality Date  . ABDOMINAL HYSTERECTOMY    . COLONOSCOPY N/A 11/23/2017   Procedure: COLONOSCOPY;  Surgeon: Lin Landsman, MD;  Location: [facility] ENDOSCOPY;  Service: Gastroenterology;  Laterality: N/A;  . COLONOSCOPY N/A 11/24/2017   Procedure: COLONOSCOPY;  Surgeon: Lin Landsman, MD;  Location: [facility] ENDOSCOPY;  Service: Gastroenterology;  Laterality: N/A;  . KNEE SURGERY Right   . TONSILLECTOMY      Prior to Admission medications   Medication Sig Start Date End Date Taking? Authorizing Provider  albuterol  (PROVENTIL HFA;VENTOLIN HFA) 108 (90 Base) MCG/ACT inhaler Inhale 1-2 puffs into the lungs every 6 (six) hours as needed for wheezing or shortness of breath. 05/14/18   Sable Feil, PA-C  ALPRAZolam Duanne Moron) 0.5 MG tablet Take by mouth.    [provider]  ALPRAZolam Duanne Moron) 1 MG tablet Take by mouth.    [provider]  amitriptyline (ELAVIL) 25 MG tablet Take 1 tablet (25 mg total) by mouth at bedtime. 12/06/17   [name], MD  azithromycin (ZITHROMAX) 500 MG tablet Take 1 tablet (500 mg total) by mouth daily for 3 days. Take 1 tablet daily for 3 days. 05/15/18 05/18/18  Gregor Hams, MD  benzonatate (TESSALON PERLES) 100 MG capsule Take 2 capsules (200 mg total) by mouth 3 (three) times daily as needed. 05/14/18 05/14/19  Sable Feil, PA-C  gabapentin (NEURONTIN) 300 MG capsule Take by mouth. 05/09/10   [provider]  gabapentin (NEURONTIN) 400 MG capsule Take 1 capsule (400 mg total) by mouth 3 (three) times daily after meals. 12/06/17   [name], MD  hydrochlorothiazide (HYDRODIURIL) 25 MG tablet Take 1 tablet (25 mg total) by mouth daily. 12/06/17   [name], MD  HYDROcodone-acetaminophen (NORCO) 10-325 MG tablet Take by mouth. 10/18/12   [provider]  hydrOXYzine (ATARAX/VISTARIL) 50 MG tablet Take 1 tablet (50 mg total) by mouth 3 (three) times daily as needed for anxiety or itching. 12/06/17   [name], MD  ibuprofen (ADVIL,MOTRIN) 600 MG tablet Take 1 tablet (600 mg total) by mouth every 6 (six) hours as needed. 12/27/17   Rudene Re, MD  levothyroxine (SYNTHROID, LEVOTHROID) 100 MCG tablet Take by mouth.    [provider]  levothyroxine (SYNTHROID, LEVOTHROID) 50 MCG tablet Take 1 tablet (50 mcg total) by mouth daily before breakfast. 12/06/17   [name], MD  lisinopril (PRINIVIL,ZESTRIL) 20 MG tablet Take by mouth.    [provider]  lisinopril (PRINIVIL,ZESTRIL) 5 MG tablet Take 1 tablet (5  mg total) by mouth daily. 12/06/17   [name], MD  lisinopril-hydrochlorothiazide (PRINZIDE,ZESTORETIC) 20-25 MG tablet Take by mouth.    [provider]  loratadine (CLARITIN) 10 MG tablet Take 1 tablet (10 mg total) by mouth daily. 12/06/17   [name], MD  methylPREDNISolone (MEDROL DOSEPAK) 4 MG TBPK tablet Take Tape[place] dose as directed 05/14/18   Sable Feil, PA-C  ondansetron (ZOFRAN ODT) 4 MG disintegrating tablet Take 1 tablet (4 mg total) by mouth every 8 (eight) hours as needed for nausea or vomiting. 12/27/17   Alfred Levins, [place], MD  oxyCODONE (OXY IR/ROXICODONE) 5 MG immediate release tablet Take by mouth. 07/18/14   [provider]  Oxycodone HCl 10 MG TABS Take by mouth. 10/10/12   [provider]  polyethylene glycol (MIRALAX / GLYCOLAX) packet Take 17 g by mouth daily. 12/06/17   [name], MD  promethazine (PHENERGAN) 12.5 MG tablet Take by mouth. 10/04/12   [provider]  QUEtiapine (SEROQUEL) 200 MG tablet Take by mouth.    [provider]  QUEtiapine (SEROQUEL) 400 MG tablet Take 1 tablet (400 mg total) by mouth at bedtime. 12/06/17   [name], MD  traMADol (ULTRAM) 50 MG tablet Take by mouth.    [provider]  traMADol (ULTRAM) 50 MG tablet Take 1 tablet (50 mg total) by mouth every 12 (twelve) hours as needed. 05/14/18   Sable Feil, PA-C  triamcinolone ointment (KENALOG) 0.1 % Apply topically 3 (three) times daily as needed (apply to rash-lower neck, upper chest, few spots on bilateral and upper and lower extremities). 12/06/17   [name], MD    Allergies Amoxicillin; Effexor [venlafaxine]; Ketorolac; Lamotrigine; and Sulfa antibiotics  No family history on file.  Social History Social History   Tobacco Use  . Smoking status: Never Smoker  . Smokeless tobacco: Never Used  Substance Use Topics  . Alcohol use: No  . Drug use: Not Currently    Comment: crack and opiates    Review  of Systems Constitutional: No fever/chills Eyes: No visual changes. ENT: No sore throat. Cardiovascular: Denies chest pain. Respiratory: Denies shortness of breath.  Positive for cough Gastrointestinal: No abdominal pain.  No nausea, no vomiting.  No diarrhea.  No constipation. Genitourinary: Negative for dysuria. Musculoskeletal: Negative for neck pain.  Negative for back pain. Integumentary: Negative for rash. Neurological: Negative for headaches, focal weakness or numbness.   ____________________________________________   PHYSICAL EXAM:  VITAL SIGNS: ED Triage Vitals  Enc Vitals Group     BP 05/15/18 2045 (!) 140/101     Pulse Rate 05/15/18 2045 (!) 109     Resp 05/15/18 2045 20     Temp 05/15/18 2045 98.4 F (36.9 C)     Temp Source 05/15/18 2045 Oral     SpO2 05/15/18 2045 95 %     Weight 05/15/18 2048 71.7 kg (158 lb)     Height 05/15/18 2048 1.575 m (5\' 2" )     Head Circumference --      Peak Flow --      Pain Score 05/15/18 2047 10  Pain Loc --      Pain Edu? --      Excl. in [place]? --     Constitutional: Alert and oriented. Well appearing and in no acute distress. Eyes: Conjunctivae are normal.  Mouth/Throat: Mucous membranes are moist. Oropharynx non-erythematous. Neck: No stridor. Cardiovascular: Normal rate, regular rhythm. Good peripheral circulation. Grossly normal heart sounds. Respiratory: Normal respiratory effort.  No retractions. Lungs CTAB. Gastrointestinal: Soft and nontender. No distention.  Musculoskeletal: No lower extremity tenderness nor edema. No gross deformities of extremities. Neurologic:  Normal speech and language. No gross focal neurologic deficits are appreciated.  Skin:  Skin is warm, dry and intact. No rash noted.   ____________________________________________   LABS (all labs ordered are listed, but only abnormal results are displayed)  Labs Reviewed  INFLUENZA PANEL BY PCR (TYPE A & B)     Procedures   ____________________________________________   INITIAL IMPRESSION / ASSESSMENT AND PLAN / ED COURSE  As part of my medical decision making, I reviewed the following data within the electronic MEDICAL RECORD NUMBER  53 year old female presented with above-stated history and physical exam secondary to cough chest x-ray negative influenza negative.  Suspect bronchitis as etiology for the patient's symptoms.  Patient given azithromycin and Tussionex in the emergency department.  Patient was prescribed Tessalon Perles yesterday. ____________________________________________  FINAL CLINICAL IMPRESSION(S) / ED DIAGNOSES  Final diagnoses:  Acute bronchitis, unspecified organism     MEDICATIONS GIVEN DURING THIS VISIT:  Medications  ketorolac (TORADOL) 30 MG/ML injection 30 mg (30 mg Intramuscular Given 05/15/18 2236)  azithromycin (ZITHROMAX) tablet 500 mg (500 mg Oral Given 05/15/18 2236)  chlorpheniramine-HYDROcodone (TUSSIONEX) 10-8 MG/5ML suspension 5 mL (5 mLs Oral Given 05/15/18 2230)     ED Discharge Orders         Ordered    azithromycin (ZITHROMAX) 500 MG tablet  Daily     05/15/18 2228           Note:  This document was prepared using Dragon voice recognition software and may include unintentional dictation errors.    Gregor Hams, MD 05/17/18 [phone]

## 2018-05-24 ENCOUNTER — Emergency Department: Payer: Medicaid Other

## 2018-05-24 ENCOUNTER — Other Ambulatory Visit: Payer: Self-pay

## 2018-05-24 ENCOUNTER — Emergency Department
Admission: EM | Admit: 2018-05-24 | Discharge: 2018-05-24 | Disposition: A | Discharge status: Home / Self Care | Payer: Medicaid Other | Attending: Nurse Practitioner | Admitting: Nurse Practitioner

## 2018-05-24 ENCOUNTER — Encounter: Payer: Self-pay | Admitting: Emergency Medicine

## 2018-05-24 DIAGNOSIS — F319 Bipolar disorder, unspecified: Secondary | ICD-10-CM | POA: Insufficient documentation

## 2018-05-24 DIAGNOSIS — J45909 Unspecified asthma, uncomplicated: Secondary | ICD-10-CM | POA: Diagnosis not present

## 2018-05-24 DIAGNOSIS — I10 Essential (primary) hypertension: Secondary | ICD-10-CM | POA: Insufficient documentation

## 2018-05-24 DIAGNOSIS — E871 Hypo-osmolality and hyponatremia: Secondary | ICD-10-CM | POA: Insufficient documentation

## 2018-05-24 DIAGNOSIS — E876 Hypokalemia: Secondary | ICD-10-CM | POA: Diagnosis not present

## 2018-05-24 DIAGNOSIS — N12 Tubulo-interstitial nephritis, not specified as acute or chronic: Secondary | ICD-10-CM | POA: Insufficient documentation

## 2018-05-24 DIAGNOSIS — R319 Hematuria, unspecified: Secondary | ICD-10-CM | POA: Diagnosis present

## 2018-05-24 LAB — LIPASE, BLOOD: Lipase: 21 U/L (ref 11–51)

## 2018-05-24 LAB — BASIC METABOLIC PANEL
Anion gap: 9 (ref 5–15)
BUN: 14 mg/dL (ref 6–20)
CO2: 21 mmol/L — ABNORMAL LOW (ref 22–32)
Calcium: 7.3 mg/dL — ABNORMAL LOW (ref 8.9–10.3)
Chloride: 105 mmol/L (ref 98–111)
Creatinine, Ser: 1.06 mg/dL — ABNORMAL HIGH (ref 0.44–1.00)
GFR calc Af Amer: >60 mL/min (ref >60)
GLUCOSE: 136 mg/dL — AB (ref 70–99)
Potassium: 2.8 mmol/L — ABNORMAL LOW (ref 3.5–5.1)
Sodium: 135 mmol/L (ref 135–145)

## 2018-05-24 LAB — COMPREHENSIVE METABOLIC PANEL
ALBUMIN: 3.5 g/dL (ref 3.5–5.0)
ALT: 17 U/L (ref 0–44)
AST: 22 U/L (ref 15–41)
Alkaline Phosphatase: 59 U/L (ref 38–126)
Anion gap: 10 (ref 5–15)
BILIRUBIN TOTAL: 0.7 mg/dL (ref 0.3–1.2)
BUN: 15 mg/dL (ref 6–20)
CO2: 23 mmol/L (ref 22–32)
Calcium: 8.4 mg/dL — ABNORMAL LOW (ref 8.9–10.3)
Chloride: 98 mmol/L (ref 98–111)
Creatinine, Ser: 1.12 mg/dL — ABNORMAL HIGH (ref 0.44–1.00)
GFR calc Af Amer: >60 mL/min (ref >60)
GFR calc non Af Amer: 56 mL/min — ABNORMAL LOW (ref >60)
GLUCOSE: 121 mg/dL — AB (ref 70–99)
POTASSIUM: 3 mmol/L — AB (ref 3.5–5.1)
SODIUM: 131 mmol/L — AB (ref 135–145)
TOTAL PROTEIN: 7.7 g/dL (ref 6.5–8.1)

## 2018-05-24 LAB — LACTIC ACID, PLASMA: Lactic Acid, Venous: 0.7 mmol/L (ref 0.5–1.9)

## 2018-05-24 LAB — URINALYSIS, COMPLETE (UACMP) WITH MICROSCOPIC
Bilirubin Urine: NEGATIVE
Glucose, UA: NEGATIVE mg/dL
Ketones, ur: NEGATIVE mg/dL
Nitrite: POSITIVE — AB
PROTEIN: 30 mg/dL — AB
SPECIFIC GRAVITY, URINE: 1.009 (ref 1.005–1.030)
pH: 6 (ref 5.0–8.0)

## 2018-05-24 LAB — CBC
HEMATOCRIT: 36.2 % (ref 36.0–46.0)
HEMOGLOBIN: 11.6 g/dL — AB (ref 12.0–15.0)
MCH: 26.4 pg (ref 26.0–34.0)
MCHC: 32 g/dL (ref 30.0–36.0)
MCV: 82.5 fL (ref 80.0–100.0)
Platelets: 226 10*3/uL (ref 150–400)
RBC: 4.39 MIL/uL (ref 3.87–5.11)
RDW: 14.2 % (ref 11.5–15.5)
WBC: 8.1 10*3/uL (ref 4.0–10.5)
nRBC: 0 % (ref 0.0–0.2)

## 2018-05-24 MED ORDER — SODIUM CHLORIDE 0.9 % IV BOLUS
1000.0000 mL | Freq: Once | INTRAVENOUS | Status: AC
  Administered 2018-05-24: 1000 mL via INTRAVENOUS

## 2018-05-24 MED ORDER — POTASSIUM CHLORIDE CRYS ER 20 MEQ PO TBCR
40.0000 meq | EXTENDED_RELEASE_TABLET | Freq: Once | ORAL | Status: AC
  Administered 2018-05-24: 40 meq via ORAL
  Filled 2018-05-24: qty 2

## 2018-05-24 MED ORDER — CEPHALEXIN 500 MG PO CAPS: 500.0000 mg | ORAL_CAPSULE | Freq: Two times a day (BID) | ORAL | 0 refills | Status: AC

## 2018-05-24 MED ORDER — ACETAMINOPHEN 325 MG PO TABS
650.0000 mg | ORAL_TABLET | Freq: Once | ORAL | Status: AC | PRN
  Administered 2018-05-24: 650 mg via ORAL
  Filled 2018-05-24: qty 2

## 2018-05-24 MED ORDER — CEPHALEXIN 500 MG PO CAPS: 500.0000 mg | ORAL_CAPSULE | Freq: Two times a day (BID) | ORAL | 0 refills | Status: DC

## 2018-05-24 MED ORDER — ONDANSETRON 4 MG PO TBDP: 4.0000 mg | ORAL_TABLET | Freq: Three times a day (TID) | ORAL | 0 refills | Status: DC | PRN

## 2018-05-24 MED ORDER — POTASSIUM CHLORIDE ER 10 MEQ PO TBCR: 20.0000 meq | EXTENDED_RELEASE_TABLET | Freq: Every day | ORAL | 0 refills | Status: DC

## 2018-05-24 MED ORDER — HYDROMORPHONE HCL 1 MG/ML IJ SOLN
1.0000 mg | Freq: Once | INTRAMUSCULAR | Status: AC
  Administered 2018-05-24: 1 mg via INTRAVENOUS
  Filled 2018-05-24: qty 1

## 2018-05-24 MED ORDER — ONDANSETRON 4 MG PO TBDP
4.0000 mg | ORAL_TABLET | Freq: Once | ORAL | Status: AC | PRN
  Administered 2018-05-24: 4 mg via ORAL
  Filled 2018-05-24: qty 1

## 2018-05-24 MED ORDER — SODIUM CHLORIDE 0.9 % IV SOLN
1.0000 g | Freq: Once | INTRAVENOUS | Status: AC
  Administered 2018-05-24: 1 g via INTRAVENOUS
  Filled 2018-05-24: qty 10

## 2018-05-24 MED ORDER — IBUPROFEN 800 MG PO TABS
800.0000 mg | ORAL_TABLET | Freq: Once | ORAL | Status: AC
  Administered 2018-05-24: 800 mg via ORAL
  Filled 2018-05-24: qty 1

## 2018-05-24 MED ORDER — ONDANSETRON HCL 4 MG/2ML IJ SOLN
4.0000 mg | Freq: Once | INTRAMUSCULAR | Status: AC
  Administered 2018-05-24: 4 mg via INTRAVENOUS
  Filled 2018-05-24: qty 2

## 2018-05-24 NOTE — ED Notes (Signed)
Discharge paperwork reviewed with patient. Patient states understanding of discharge info. Patient educated on importance to complete ABT and to drink plenty of fluids and to follow up with PCP. Patient signed paper discharge.

## 2018-05-24 NOTE — ED Triage Notes (Signed)
Pt arrives with complaints of left sided abdominal pain. Pt unable to describe pain other than stating "it is painful." Pt also reports generalized body aches,headache, fever, nausea, vomiting, and vaginal discharge. Pt reports last dosage of OTC medicine at 1200.

## 2018-05-24 NOTE — ED Provider Notes (Signed)
[facility] Emergency Department Provider Note  ____________________________________________  Time seen: Approximately 7:44 PM  I have reviewed the triage vital signs and the nursing notes.   HISTORY  Chief Complaint Abdominal Pain    HPI Teresa Jefferson is a 53 y.o. female with a history of HTN, bipolar and schizophrenia, presenting with hematuria and urinary symptoms, fever, inability to keep down fluids..  The patient states that for the past 3 days, she has noted blood and "green stuff" when she wiped after urinating; she had associated dysuria.  She has never had a stone before, but has been experiencing left flank pain radiating to the left lower quadrant.  She has had fever, and has been unable to keep down any food or liquid.  She has not had any constipation or diarrhea.  No lightheadedness or syncope.  Past Medical History:  Diagnosis Date  . Bipolar 1 disorder ([place])   . Depression   . Hypertension   . Neuropathy   . Schizophrenia ([place])   . Thyroid disease     Patient Active Problem List   Diagnosis Date Noted  . MDD (major depressive disorder), recurrent, severe, with psychosis ([place]) 12/05/2017  . Essential hypertension 11/17/2017  . Asthma 11/17/2017  . 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 Cocaine use disorder, severe, dependence ([place]) 11/17/2017  . Opioid use disorder, severe, dependence ([place]) 11/17/2017  . Severe episode of recurrent major depressive disorder, without psychotic features ([place]) 11/16/2017  . Opiate abuse, continuous ([place]) 11/16/2017  . Cocaine abuse ([place]) 11/16/2017  . Suicidal ideation 11/16/2017  . Chronic pain 11/16/2017    Past Surgical History:  Procedure Laterality Date  . ABDOMINAL HYSTERECTOMY    . COLONOSCOPY N/A 11/23/2017   Procedure: COLONOSCOPY;  Surgeon: Lin Landsman, MD;  Location[facility] ENDOSCOPY;  Service: Gastroenterology;  Laterality: N/A;  . COLONOSCOPY N/A  11/24/2017   Procedure: COLONOSCOPY;  Surgeon: Lin Landsman, MD;  Location: [facility] ENDOSCOPY;  Service: Gastroenterology;  Laterality: N/A;  . KNEE SURGERY Right   . TONSILLECTOMY      Current Outpatient Rx  . Order #: [id number] Class: Normal  . Order #: [id number] Class: Historical Med  . Order #: [id number] Class: Historical Med  . Order #: [id number] Class: Print  . Order #: [id number] Class: Normal  . Order #: [id number] Class: Print  . Order #: [id number] Class: Historical Med  . Order #: [id number] Class: Print  . Order #: [id number] Class: Print  . Order #: [id number] Class: Historical Med  . Order #: [id number] Class: Print  . Order #: [id number] Class: Print  . Order #: [id number] Class: Historical Med  . Order #: [id number] Class: Print  . Order #: [id number] Class: Historical Med  . Order #: [id number] Class: Print  . Order #: [id number] Class: Historical Med  . Order #: [id number] Class: Print  . Order #: [id number] Class: Normal  . Order #: [id number] Class: Print  . Order #: [id number] Class: Historical Med  . Order #: [id number] Class: Historical Med  . Order #: [id number] Class: Print  . Order #: [id number] Class: Print  . Order #: [id number] Class: Historical Med  . Order #: [id number] Class: Historical Med  . Order #: [id number] Class: Print  . Order #: [id number] Class: Historical Med  . Order #: [id number] Class: Normal  . Order #: [id number] Class: Print    Allergies Amoxicillin; Effexor [venlafaxine]; Ketorolac; Lamotrigine; and Sulfa antibiotics  No family history on file.  Social History Social History   Tobacco Use  . Smoking status: Never Smoker  . Smokeless tobacco: Never Used  Substance Use Topics  . Alcohol  use: No  . Drug use: Not Currently    Comment: crack and opiates    Review of Systems Constitutional: No fever/chills.  Lightheadedness or syncope.  Positive general malaise. Eyes: No visual changes. ENT: No sore throat. No congestion or rhinorrhea. Cardiovascular: Denies chest  pain. Denies palpitations. Respiratory: Denies shortness of breath.  No cough. Gastrointestinal: Left flank and left lower quadrant abdominal pain.  +nausea, +vomiting.  No diarrhea.  No constipation. Genitourinary: Stiffer hematuria, dysuria and green discharge in the urine. Musculoskeletal: Negative for back pain except the flank pain. Skin: Negative for rash. Neurological: Negative for headaches. No focal numbness, tingling or weakness.     ____________________________________________   PHYSICAL EXAM:  VITAL SIGNS: ED Triage Vitals  Enc Vitals Group     BP 05/24/18 1759 136/80     Pulse Rate 05/24/18 1759 (!) 107     Resp 05/24/18 1759 20     Temp 05/24/18 1759 (!) 102.7 F (39.3 C)     Temp Source 05/24/18 1759 Oral     SpO2 05/24/18 1759 100 %     Weight 05/24/18 1800 156 lb (70.8 kg)     Height 05/24/18 1800 5\' 2"  (1.575 m)     Head Circumference --      Peak Flow --      Pain Score 05/24/18 1800 10     Pain Loc --      Pain Edu? --      Excl. in [place]? --     Constitutional: Alert and oriented.  Answers questions appropriately. Eyes: Conjunctivae are normal.  EOMI. No scleral icterus. Head: Atraumatic. Nose: No congestion/rhinnorhea. Mouth/Throat: Mucous membranes are moist.  Neck: No stridor.  Supple.  No JVD.  No meningismus. Cardiovascular: Normal rate, regular rhythm. No murmurs, rubs or gallops.  Respiratory: Normal respiratory effort.  No accessory muscle use or retractions. Lungs CTAB.  No wheezes, rales or ronchi. Gastrointestinal: Soft, and nondistended.  Positive left CVA tenderness.  Positive mild tenderness to palpation in the left lower quadrant no guarding or rebound.  No peritoneal signs. Musculoskeletal: No LE edema. Neurologic:  A&Ox3.  Speech is clear.  Face and smile are symmetric.  EOMI.  Moves all extremities well. Skin:  Skin is warm, dry and intact. No rash noted. Psychiatric: Positive bizarre affect with slightly pressured speech.  However,  the patient has good insight and is answering questions appropriately.  ____________________________________________   LABS (all labs ordered are listed, but only abnormal results are displayed)  Labs Reviewed  COMPREHENSIVE METABOLIC PANEL - Abnormal; Notable for the following components:      Result Value   Sodium 131 (*)    Potassium 3.0 (*)    Glucose, Bld 121 (*)    Creatinine, Ser 1.12 (*)    Calcium 8.4 (*)    GFR calc non Af Amer 56 (*)    All other components within normal limits  CBC - Abnormal; Notable for the following components:   Hemoglobin 11.6 (*)    All other components within normal limits  URINALYSIS, COMPLETE (UACMP) WITH MICROSCOPIC - Abnormal; Notable for the following components:   Color, Urine YELLOW (*)    APPearance CLOUDY (*)    Hgb urine dipstick SMALL (*)    Protein, ur 30 (*)    Nitrite POSITIVE (*)    Leukocytes, UA LARGE (*)    WBC, UA >50 (*)    Bacteria, UA RARE (*)    All other components within normal limits  BASIC METABOLIC PANEL - Abnormal; Notable for the following components:   Potassium 2.8 (*)    CO2 21 (*)    Glucose, Bld 136 (*)    Creatinine, Ser 1.06 (*)    Calcium 7.3 (*)    All other components within normal limits  URINE CULTURE  LIPASE, BLOOD  LACTIC ACID, PLASMA   ____________________________________________  EKG  Not indicated ____________________________________________  RADIOLOGY  Ct Renal Stone Study  Result Date: 05/24/2018 CLINICAL DATA:  Abdominal pain, fever, vomiting. EXAM: CT ABDOMEN AND PELVIS WITHOUT CONTRAST TECHNIQUE: Multidetector CT imaging of the abdomen and pelvis was performed following the standard protocol without IV contrast. COMPARISON:  12/27/2017 FINDINGS: Lower chest: Nonspecific subcentimeter opacity at the lateral right lung base on image 10 is stable dating back to 2015. Subcentimeter nodule in the posterior left lower lobe on image 4 is stable. Left lower lobe pulmonary cyst is stable.  Hepatobiliary: Subcentimeter hypodensity in the right lobe of the liver on image 18 is stable supporting benign etiology. Postcholecystectomy. Pancreas: Pancreas is unremarkable. Spleen: Unremarkable Adrenals/Urinary Tract: Adrenal glands are within normal limits. There is no hydronephrosis. There is a punctate calculus in the anterior mid left kidney on image 37. There is left perinephric stranding as well as stranding surrounding the proximal left ureter and renal pelvis. Bladder is within normal limits. Stomach/Bowel: Normal appendix. No obvious mass in the colon. No evidence of small-bowel obstruction. Vascular/Lymphatic: No abnormal retroperitoneal adenopathy. No evidence of aortic aneurysm. Reproductive: Uterus is absent. Other: No free fluid. Musculoskeletal: No vertebral compression deformity. IMPRESSION: There is left perinephric stranding as well as stranding surrounding the left renal pelvis and proximal ureter. Differential diagnosis includes pyelonephritis or a recently passed calculus. Correlate with urinalysis. Left nephrolithiasis. No ureteral calculi are visualized. Electronically Signed   By: Marybelle Killings M.D.   On: 05/24/2018 20:15    ____________________________________________   PROCEDURES  Procedure(s) performed: None  Procedures  Critical Care performed: No ____________________________________________   INITIAL IMPRESSION / ASSESSMENT AND PLAN / ED COURSE  Pertinent labs & imaging results that were available during my care of the patient were reviewed by me and considered in my medical decision making (see chart for details).  53 y.o. female with 3 days of urinary symptoms, nausea and vomiting, and fever.  Overall, the patient's clinical picture and laboratory studies are suggestive of pyelonephritis.  He does have some associated mild renal insufficiency, hemorrhagic natremia and hypokalemia, which are likely from her nausea vomiting and mild dehydration.  I will treat her  with intravenous fluids and potassium, and recheck her after treatment.  She will be given Rocephin for IV treatment of her pyelonephritis.  Given her focal left-sided flank and abdominal pain, get a CT to rule out renal stone as the cause for her infection.  Plan reevaluation for final disposition.  ----------------------------------------- 10:32 PM on 05/24/2018 -----------------------------------------  The patient is feeling much better at this time and has received intravenous fluid and IV abx.  She is able tolerate PO.  Plan discharge with close PMD f/u.  ____________________________________________  FINAL CLINICAL IMPRESSION(S) / ED DIAGNOSES  Final diagnoses:  Pyelonephritis  Hyponatremia  Hypokalemia         NEW MEDICATIONS STARTED DURING THIS VISIT:  New Prescriptions   CEPHALEXIN (KEFLEX) 500 MG CAPSULE    Take 1 capsule (500 mg total) by mouth 2 (two) times daily for 10 days.   ONDANSETRON (ZOFRAN ODT) 4 MG DISINTEGRATING TABLET    Take 1 tablet (4  mg total) by mouth every 8 (eight) hours as needed for nausea or vomiting.   POTASSIUM CHLORIDE (K-DUR) 10 MEQ TABLET    Take 2 tablets (20 mEq total) by mouth daily for 3 days.      Eula Listen, MD 05/24/18 2234

## 2018-05-24 NOTE — Discharge Instructions (Addendum)
Please drink plenty of fluid to stay well-hydrated.  Please take the entire course of antibiotics, even if you are feeling better.  Please have your primary care physician recheck your potassium level, which was a little low today.  Return to the emergency department if you develop severe pain, lightheadedness or fainting, inability to keep down fluids, or any other symptoms concerning to you.

## 2018-05-24 NOTE — ED Notes (Signed)
Patient states symptoms started 3 days ago with abdominal pain fever and throwing up. Patient states having discharge when urinating. Patient states she has not been able to drink anything today because of the vomiting.

## 2018-05-27 LAB — URINE CULTURE: Culture: >=100000 — AB

## 2018-09-07 ENCOUNTER — Other Ambulatory Visit: Payer: Self-pay

## 2018-09-07 ENCOUNTER — Encounter: Payer: Self-pay | Admitting: Emergency Medicine

## 2018-09-07 ENCOUNTER — Emergency Department
Admission: EM | Admit: 2018-09-07 | Discharge: 2018-09-07 | Disposition: A | Discharge status: Home / Self Care | Payer: Medicaid Other | Attending: Emergency Medicine | Admitting: Emergency Medicine

## 2018-09-07 ENCOUNTER — Emergency Department: Payer: Medicaid Other

## 2018-09-07 DIAGNOSIS — F319 Bipolar disorder, unspecified: Secondary | ICD-10-CM | POA: Insufficient documentation

## 2018-09-07 DIAGNOSIS — F209 Schizophrenia, unspecified: Secondary | ICD-10-CM | POA: Diagnosis not present

## 2018-09-07 DIAGNOSIS — F1990 Other psychoactive substance use, unspecified, uncomplicated: Secondary | ICD-10-CM | POA: Diagnosis present

## 2018-09-07 DIAGNOSIS — Z79899 Other long term (current) drug therapy: Secondary | ICD-10-CM | POA: Diagnosis not present

## 2018-09-07 DIAGNOSIS — I10 Essential (primary) hypertension: Secondary | ICD-10-CM | POA: Diagnosis not present

## 2018-09-07 LAB — CBC
HCT: 38.4 % (ref 36.0–46.0)
Hemoglobin: 12.1 g/dL (ref 12.0–15.0)
MCH: 25.3 pg — ABNORMAL LOW (ref 26.0–34.0)
MCHC: 31.5 g/dL (ref 30.0–36.0)
MCV: 80.2 fL (ref 80.0–100.0)
Platelets: 286 10*3/uL (ref 150–400)
RBC: 4.79 MIL/uL (ref 3.87–5.11)
RDW: 15.9 % — ABNORMAL HIGH (ref 11.5–15.5)
WBC: 5.5 10*3/uL (ref 4.0–10.5)
nRBC: 0 % (ref 0.0–0.2)

## 2018-09-07 LAB — SALICYLATE LEVEL: Salicylate Lvl: <7 mg/dL (ref 2.8–30.0)

## 2018-09-07 LAB — URINE DRUG SCREEN, QUALITATIVE (ARMC ONLY)
Amphetamines, Ur Screen: NOT DETECTED
Barbiturates, Ur Screen: NOT DETECTED
Benzodiazepine, Ur Scrn: NOT DETECTED
Cannabinoid 50 Ng, Ur ~~LOC~~: NOT DETECTED
Cocaine Metabolite,Ur ~~LOC~~: POSITIVE — AB
MDMA (Ecstasy)Ur Screen: NOT DETECTED
Methadone Scn, Ur: NOT DETECTED
Opiate, Ur Screen: NOT DETECTED
Phencyclidine (PCP) Ur S: NOT DETECTED
Tricyclic, Ur Screen: POSITIVE — AB

## 2018-09-07 LAB — COMPREHENSIVE METABOLIC PANEL
ALT: 11 U/L (ref 0–44)
AST: 15 U/L (ref 15–41)
Albumin: 4 g/dL (ref 3.5–5.0)
Alkaline Phosphatase: 78 U/L (ref 38–126)
Anion gap: 7 (ref 5–15)
BUN: 25 mg/dL — ABNORMAL HIGH (ref 6–20)
CO2: 19 mmol/L — ABNORMAL LOW (ref 22–32)
Calcium: 9.5 mg/dL (ref 8.9–10.3)
Chloride: 112 mmol/L — ABNORMAL HIGH (ref 98–111)
Creatinine, Ser: 1.11 mg/dL — ABNORMAL HIGH (ref 0.44–1.00)
GFR calc Af Amer: >60 mL/min (ref >60)
GFR calc non Af Amer: 57 mL/min — ABNORMAL LOW (ref >60)
Glucose, Bld: 125 mg/dL — ABNORMAL HIGH (ref 70–99)
Potassium: 4.1 mmol/L (ref 3.5–5.1)
Sodium: 138 mmol/L (ref 135–145)
Total Bilirubin: 0.5 mg/dL (ref 0.3–1.2)
Total Protein: 8.1 g/dL (ref 6.5–8.1)

## 2018-09-07 LAB — ETHANOL: Alcohol, Ethyl (B): <10 mg/dL (ref <10)

## 2018-09-07 LAB — ACETAMINOPHEN LEVEL: Acetaminophen (Tylenol), Serum: <10 ug/mL — ABNORMAL LOW (ref 10–30)

## 2018-09-07 MED ORDER — KETOROLAC TROMETHAMINE 30 MG/ML IJ SOLN
INTRAMUSCULAR | Status: AC
  Administered 2018-09-07: 19:00:00 30 mg via INTRAMUSCULAR
  Filled 2018-09-07: qty 1

## 2018-09-07 MED ORDER — KETOROLAC TROMETHAMINE 30 MG/ML IJ SOLN
30.0000 mg | Freq: Once | INTRAMUSCULAR | Status: AC
  Administered 2018-09-07: 30 mg via INTRAMUSCULAR

## 2018-09-07 NOTE — ED Provider Notes (Signed)
[facility] Emergency Department Provider Note  Time seen: 5:37 PM  I have reviewed the triage vital signs and the nursing notes.   HISTORY  Chief Complaint Addiction Problem   HPI Teresa Jefferson is a 53 y.o. female with a past medical history of bipolar disease, schizophrenia, polysubstance abuse, presents to the emergency department hoping for detox.  According to the patient she has been using opioids, benzodiazepines and cocaine.  Last use was 2 days ago per patient.  Denies any alcohol use.  Patient states she is wishing to detox from the substances and is requesting help.  Patient also is describing shortness of breath over the past 2 days as well.  Denies any fever or congestion but does state an occasional dry cough.   Past Medical History:  Diagnosis Date  . Bipolar 1 disorder ([place])   . Depression   . Hypertension   . Neuropathy   . Schizophrenia ([place])   . Thyroid disease     Patient Active Problem List   Diagnosis Date Noted  . MDD (major depressive disorder), recurrent, severe, with psychosis ([place]) 12/05/2017  . Essential hypertension 11/17/2017  . Asthma 11/17/2017  . 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 Cocaine use disorder, severe, dependence ([place]) 11/17/2017  . Opioid use disorder, severe, dependence ([place]) 11/17/2017  . Severe episode of recurrent major depressive disorder, without psychotic features ([place]) 11/16/2017  . Opiate abuse, continuous ([place]) 11/16/2017  . Cocaine abuse ([place]) 11/16/2017  . Suicidal ideation 11/16/2017  . Chronic pain 11/16/2017    Past Surgical History:  Procedure Laterality Date  . ABDOMINAL HYSTERECTOMY    . COLONOSCOPY N/A 11/23/2017   Procedure: COLONOSCOPY;  Surgeon: Lin Landsman, MD;  Location: [facility] ENDOSCOPY;  Service: Gastroenterology;  Laterality: N/A;  . COLONOSCOPY N/A 11/24/2017   Procedure: COLONOSCOPY;  Surgeon: Lin Landsman, MD;  Location: [facility]  ENDOSCOPY;  Service: Gastroenterology;  Laterality: N/A;  . KNEE SURGERY Right   . TONSILLECTOMY      Prior to Admission medications   Medication Sig Start Date End Date Taking? Authorizing Provider  albuterol (PROVENTIL HFA;VENTOLIN HFA) 108 (90 Base) MCG/ACT inhaler Inhale 1-2 puffs into the lungs every 6 (six) hours as needed for wheezing or shortness of breath. 05/14/18   Sable Feil, PA-C  ALPRAZolam Duanne Moron) 0.5 MG tablet Take by mouth.    [provider]  ALPRAZolam Duanne Moron) 1 MG tablet Take by mouth.    [provider]  amitriptyline (ELAVIL) 25 MG tablet Take 1 tablet (25 mg total) by mouth at bedtime. 12/06/17   [name], MD  benzonatate (TESSALON PERLES) 100 MG capsule Take 2 capsules (200 mg total) by mouth 3 (three) times daily as needed. 05/14/18 05/14/19  Sable Feil, PA-C  gabapentin (NEURONTIN) 300 MG capsule Take by mouth. 05/09/10   [provider]  gabapentin (NEURONTIN) 400 MG capsule Take 1 capsule (400 mg total) by mouth 3 (three) times daily after meals. 12/06/17   [name], MD  hydrochlorothiazide (HYDRODIURIL) 25 MG tablet Take 1 tablet (25 mg total) by mouth daily. 12/06/17   [name], MD  HYDROcodone-acetaminophen (NORCO) 10-325 MG tablet Take by mouth. 10/18/12   [provider]  hydrOXYzine (ATARAX/VISTARIL) 50 MG tablet Take 1 tablet (50 mg total) by mouth 3 (three) times daily as needed for anxiety or itching. 12/06/17   [name], MD  ibuprofen (ADVIL,MOTRIN) 600 MG tablet Take 1 tablet (600 mg total) by mouth every  6 (six) hours as needed. 12/27/17   Alfred Levins, [place], MD  levothyroxine (SYNTHROID, LEVOTHROID) 100 MCG tablet Take by mouth.    [provider]  levothyroxine (SYNTHROID, LEVOTHROID) 50 MCG tablet Take 1 tablet (50 mcg total) by mouth daily before breakfast. 12/06/17   [name], MD  lisinopril (PRINIVIL,ZESTRIL) 20 MG tablet Take by mouth.    [provider]   lisinopril (PRINIVIL,ZESTRIL) 5 MG tablet Take 1 tablet (5 mg total) by mouth daily. 12/06/17   [name], MD  lisinopril-hydrochlorothiazide (PRINZIDE,ZESTORETIC) 20-25 MG tablet Take by mouth.    [provider]  loratadine (CLARITIN) 10 MG tablet Take 1 tablet (10 mg total) by mouth daily. 12/06/17   [name], MD  methylPREDNISolone (MEDROL DOSEPAK) 4 MG TBPK tablet Take Tapered dose as directed 05/14/18   Sable Feil, PA-C  ondansetron (ZOFRAN ODT) 4 MG disintegrating tablet Take 1 tablet (4 mg total) by mouth every 8 (eight) hours as needed for nausea or vomiting. 05/24/18   Eula Listen, MD  oxyCODONE (OXY IR/ROXICODONE) 5 MG immediate release tablet Take by mouth. 07/18/14   [provider]  Oxycodone HCl 10 MG TABS Take by mouth. 10/10/12   [provider]  polyethylene glycol (MIRALAX / GLYCOLAX) packet Take 17 g by mouth daily. 12/06/17   [name], MD  potassium chloride (K-DUR) 10 MEQ tablet Take 2 tablets (20 mEq total) by mouth daily for 3 days. 05/24/18 05/27/18  Eula Listen, MD  promethazine (PHENERGAN) 12.5 MG tablet Take by mouth. 10/04/12   [provider]  QUEtiapine (SEROQUEL) 200 MG tablet Take by mouth.    [provider]  QUEtiapine (SEROQUEL) 400 MG tablet Take 1 tablet (400 mg total) by mouth at bedtime. 12/06/17   [name], MD  traMADol (ULTRAM) 50 MG tablet Take by mouth.    [provider]  traMADol (ULTRAM) 50 MG tablet Take 1 tablet (50 mg total) by mouth every 12 (twelve) hours as needed. 05/14/18   Sable Feil, PA-C  triamcinolone ointment (KENALOG) 0.1 % Apply topically 3 (three) times daily as needed (apply to rash-lower neck, upper chest, few spots on bilateral and upper and lower extremities). 12/06/17   [name], MD    Allergies  Allergen Reactions  . Amoxicillin Rash  . Effexor [Venlafaxine] Rash    "whelps"  . Ketorolac Itching  . Lamotrigine Rash  . Sulfa  Antibiotics Itching    No family history on file.  Social History Social History   Tobacco Use  . Smoking status: Never Smoker  . Smokeless tobacco: Never Used  Substance Use Topics  . Alcohol use: No  . Drug use: Not Currently    Comment: crack and opiates    Review of Systems Constitutional: Negative for fever. ENT: Negative for recent illness/congestion Cardiovascular: Negative for chest pain. Respiratory: Mild shortness of breath x2 days Gastrointestinal: Negative for abdominal pain, vomiting Musculoskeletal: Negative for musculoskeletal complaints Neurological: Negative for headache All other ROS negative  ____________________________________________   PHYSICAL EXAM:  VITAL SIGNS: ED Triage Vitals  Enc Vitals Group     BP 09/07/18 1704 (!) 181/110     Pulse Rate 09/07/18 1704 87     Resp 09/07/18 1704 16     Temp 09/07/18 1704 98.3 F (36.8 C)     Temp Source 09/07/18 1704 Oral     SpO2 09/07/18 1704 100 %     Weight --      Height 09/07/18 1702 5\' 2"  (  1.575 m)     Head Circumference --      Peak Flow --      Pain Score 09/07/18 1701 8     Pain Loc --      Pain Edu? --      Excl. in [place]? --    Constitutional: Alert and oriented. Well appearing and in no distress. Eyes: Normal exam ENT      Head: Normocephalic and atraumatic      Mouth/Throat: Mucous membranes are moist. Cardiovascular: Normal rate, regular rhythm.  Respiratory: Normal respiratory effort without tachypnea nor retractions. Breath sounds are clear  Gastrointestinal: Soft and nontender. No distention.   Musculoskeletal: Nontender with normal range of motion in all extremities. Neurologic:  Normal speech and language. No gross focal neurologic deficits Skin:  Skin is warm, dry and intact.  Psychiatric: Mood and affect are normal.   ____________________________________________  RADIOLOGY  IMPRESSION: Stable chest. No active cardiopulmonary  process.  ____________________________________________   INITIAL IMPRESSION / ASSESSMENT AND PLAN / ED COURSE  Pertinent labs & imaging results that were available during my care of the patient were reviewed by me and considered in my medical decision making (see chart for details).   Patient presents emergency department for polysubstance abuse and states mild shortness of breath over the past several days.  We will check labs, obtain a chest x-ray and continue to closely monitor.  We will work with TTS for detox availability.  Patient agreeable plan of care.  Patient's chest x-ray is negative.  Labs are reassuring.  Patient states she no longer wishes to speak to TTS and is asking for outpatient resources.  We will discharge home.  Teresa Jefferson was evaluated in Emergency Department on 09/07/2018 for the symptoms described in the history of present illness. She was evaluated in the context of the global COVID-19 pandemic, which necessitated consideration that the patient might be at risk for infection with the SARS-CoV-2 virus that causes COVID-19. Institutional protocols and algorithms that pertain to the evaluation of patients at risk for COVID-19 are in a state of rapid change based on information released by regulatory bodies including the CDC and federal and state organizations. These policies and algorithms were followed during the patient's care in the ED.  ____________________________________________   FINAL CLINICAL IMPRESSION(S) / ED DIAGNOSES  Substance use    Harvest Dark, MD 09/07/18 Bosie Helper

## 2018-09-07 NOTE — ED Triage Notes (Signed)
Pt to ED via POV requesting rehab for drug abuse. Pt states that she was here last year in August for same. Pt is in NAD at this time. Pt reports that she uses Benzo, narcotics, and crack. Pt reports that she last used drug 2 days ago. Pt states that she is also very fatigue and short of breath. Pt states that she has a hx/o low potassium. Pt coughing in triage. Pt is in NAD.

## 2018-09-14 ENCOUNTER — Emergency Department
Admission: EM | Admit: 2018-09-14 | Discharge: 2018-09-14 | Disposition: A | Discharge status: Home / Self Care | Payer: Medicaid Other | Attending: Emergency Medicine | Admitting: Emergency Medicine

## 2018-09-14 ENCOUNTER — Emergency Department: Payer: Medicaid Other

## 2018-09-14 ENCOUNTER — Other Ambulatory Visit: Payer: Self-pay

## 2018-09-14 DIAGNOSIS — Z79899 Other long term (current) drug therapy: Secondary | ICD-10-CM | POA: Insufficient documentation

## 2018-09-14 DIAGNOSIS — N10 Acute tubulo-interstitial nephritis: Secondary | ICD-10-CM | POA: Diagnosis not present

## 2018-09-14 DIAGNOSIS — R509 Fever, unspecified: Secondary | ICD-10-CM | POA: Diagnosis present

## 2018-09-14 DIAGNOSIS — Z20828 Contact with and (suspected) exposure to other viral communicable diseases: Secondary | ICD-10-CM | POA: Diagnosis not present

## 2018-09-14 DIAGNOSIS — I10 Essential (primary) hypertension: Secondary | ICD-10-CM | POA: Insufficient documentation

## 2018-09-14 LAB — FERRITIN: Ferritin: 68 ng/mL (ref 11–307)

## 2018-09-14 LAB — COMPREHENSIVE METABOLIC PANEL
ALT: 13 U/L (ref 0–44)
AST: 18 U/L (ref 15–41)
Albumin: 4.3 g/dL (ref 3.5–5.0)
Alkaline Phosphatase: 83 U/L (ref 38–126)
Anion gap: 8 (ref 5–15)
BUN: 25 mg/dL — ABNORMAL HIGH (ref 6–20)
CO2: 22 mmol/L (ref 22–32)
Calcium: 8.9 mg/dL (ref 8.9–10.3)
Chloride: 103 mmol/L (ref 98–111)
Creatinine, Ser: 1.13 mg/dL — ABNORMAL HIGH (ref 0.44–1.00)
GFR calc Af Amer: >60 mL/min (ref >60)
GFR calc non Af Amer: 56 mL/min — ABNORMAL LOW (ref >60)
Glucose, Bld: 123 mg/dL — ABNORMAL HIGH (ref 70–99)
Potassium: 3.9 mmol/L (ref 3.5–5.1)
Sodium: 133 mmol/L — ABNORMAL LOW (ref 135–145)
Total Bilirubin: 0.6 mg/dL (ref 0.3–1.2)
Total Protein: 8.5 g/dL — ABNORMAL HIGH (ref 6.5–8.1)

## 2018-09-14 LAB — TRIGLYCERIDES: Triglycerides: 57 mg/dL (ref <150)

## 2018-09-14 LAB — CBC WITH DIFFERENTIAL/PLATELET
Abs Immature Granulocytes: 0.04 10*3/uL (ref 0.00–0.07)
Basophils Absolute: 0 10*3/uL (ref 0.0–0.1)
Basophils Relative: 0 %
Eosinophils Absolute: 0.1 10*3/uL (ref 0.0–0.5)
Eosinophils Relative: 1 %
HCT: 39.3 % (ref 36.0–46.0)
Hemoglobin: 12.5 g/dL (ref 12.0–15.0)
Immature Granulocytes: 0 %
Lymphocytes Relative: 6 %
Lymphs Abs: 0.6 10*3/uL — ABNORMAL LOW (ref 0.7–4.0)
MCH: 25.7 pg — ABNORMAL LOW (ref 26.0–34.0)
MCHC: 31.8 g/dL (ref 30.0–36.0)
MCV: 80.7 fL (ref 80.0–100.0)
Monocytes Absolute: 0.6 10*3/uL (ref 0.1–1.0)
Monocytes Relative: 6 %
Neutro Abs: 8.1 10*3/uL — ABNORMAL HIGH (ref 1.7–7.7)
Neutrophils Relative %: 87 %
Platelets: 231 10*3/uL (ref 150–400)
RBC: 4.87 MIL/uL (ref 3.87–5.11)
RDW: 16.7 % — ABNORMAL HIGH (ref 11.5–15.5)
WBC: 9.4 10*3/uL (ref 4.0–10.5)
nRBC: 0 % (ref 0.0–0.2)

## 2018-09-14 LAB — PREGNANCY, URINE: Preg Test, Ur: NEGATIVE

## 2018-09-14 LAB — URINALYSIS, COMPLETE (UACMP) WITH MICROSCOPIC
Bilirubin Urine: NEGATIVE
Glucose, UA: NEGATIVE mg/dL
Ketones, ur: NEGATIVE mg/dL
Nitrite: POSITIVE — AB
Protein, ur: NEGATIVE mg/dL
Specific Gravity, Urine: 1.004 — ABNORMAL LOW (ref 1.005–1.030)
pH: 6 (ref 5.0–8.0)

## 2018-09-14 LAB — LACTATE DEHYDROGENASE: LDH: 181 U/L (ref 98–192)

## 2018-09-14 LAB — LACTIC ACID, PLASMA
Lactic Acid, Venous: 0.6 mmol/L (ref 0.5–1.9)
Lactic Acid, Venous: 0.7 mmol/L (ref 0.5–1.9)

## 2018-09-14 LAB — FIBRINOGEN: Fibrinogen: 443 mg/dL (ref 210–475)

## 2018-09-14 LAB — FIBRIN DERIVATIVES D-DIMER (ARMC ONLY): Fibrin derivatives D-dimer (ARMC): 484.91 ng/mL (FEU) (ref 0.00–499.00)

## 2018-09-14 LAB — PROCALCITONIN: Procalcitonin: <0.1 ng/mL

## 2018-09-14 LAB — C-REACTIVE PROTEIN: CRP: 6.2 mg/dL — ABNORMAL HIGH (ref <1.0)

## 2018-09-14 LAB — SARS CORONAVIRUS 2 BY RT PCR (HOSPITAL ORDER, PERFORMED IN ~~LOC~~ HOSPITAL LAB): SARS Coronavirus 2: NEGATIVE

## 2018-09-14 MED ORDER — SODIUM CHLORIDE 0.9 % IV SOLN
1.0000 g | Freq: Once | INTRAVENOUS | Status: AC
  Administered 2018-09-14: 20:00:00 1 g via INTRAVENOUS
  Filled 2018-09-14 (×2): qty 10

## 2018-09-14 MED ORDER — ACETAMINOPHEN 325 MG PO TABS
650.0000 mg | ORAL_TABLET | Freq: Once | ORAL | Status: AC | PRN
  Administered 2018-09-14: 650 mg via ORAL

## 2018-09-14 MED ORDER — ONDANSETRON HCL 4 MG/2ML IJ SOLN
4.0000 mg | Freq: Once | INTRAMUSCULAR | Status: AC
  Administered 2018-09-14: 4 mg via INTRAVENOUS
  Filled 2018-09-14: qty 2

## 2018-09-14 MED ORDER — CEPHALEXIN 500 MG PO CAPS: 500.0000 mg | ORAL_CAPSULE | Freq: Two times a day (BID) | ORAL | 0 refills | Status: AC

## 2018-09-14 MED ORDER — ACETAMINOPHEN 325 MG PO TABS
ORAL_TABLET | ORAL | Status: AC
  Filled 2018-09-14: qty 2

## 2018-09-14 MED ORDER — ONDANSETRON HCL 4 MG PO TABS: 4.0000 mg | ORAL_TABLET | Freq: Every day | ORAL | 0 refills | Status: DC | PRN

## 2018-09-14 MED ORDER — SODIUM CHLORIDE 0.9 % IV BOLUS
500.0000 mL | Freq: Once | INTRAVENOUS | Status: AC
  Administered 2018-09-14: 500 mL via INTRAVENOUS

## 2018-09-14 MED ORDER — OXYCODONE HCL 5 MG PO TABS
5.0000 mg | ORAL_TABLET | ORAL | Status: AC
  Administered 2018-09-14: 5 mg via ORAL
  Filled 2018-09-14 (×2): qty 1

## 2018-09-14 MED ORDER — OXYCODONE HCL 5 MG PO TABS
5.0000 mg | ORAL_TABLET | ORAL | Status: AC
  Administered 2018-09-14: 5 mg via ORAL
  Filled 2018-09-14: qty 1

## 2018-09-14 NOTE — Discharge Instructions (Signed)
Your workup today suggests that you have a urinary tract infection (UTI) which has spread to your kidneys.  Call your regular doctor to schedule the next available appointment to follow up on todays ED visit, or return immediately to the ED if your pain worsens, you have decreased urine production, develop fever, persistent vomiting, or other symptoms that concern you.

## 2018-09-14 NOTE — ED Triage Notes (Signed)
Body aches, fever, dizziness and headache. Pt tearful in triage. Reports exertional SOB. ibuprofen last taken approx 5 hours ago. Denies cp. Pt alert and oriented X4, active, cooperative, pt in NAD.

## 2018-09-14 NOTE — ED Notes (Signed)
Patient transported to CT 

## 2018-09-14 NOTE — ED Notes (Signed)
Patient transported to CT at this time. 

## 2018-09-14 NOTE — ED Provider Notes (Signed)
[facility] Emergency Department Provider Note   ____________________________________________   First MD Initiated Contact with Patient 09/14/18 1700     (approximate)  I have reviewed the triage vital signs and the nursing notes.   HISTORY  Chief Complaint Generalized Body Aches; Nausea; Dizziness; Fever; and Shortness of Breath    HPI Teresa Jefferson is a 53 y.o. female presents for evaluation for fevers chills body aches nausea and some slight shortness of breath   Reports the symptoms started yesterday evening with some feeling of nausea, then now she having muscle aches all over, pain discomfort slight cough some mild shortness of breath.  She has not been socially isolating.  She reports has been around a lot of people.  She was seen a few days ago for withdrawals, but reports those seem to be getting better.  She denies alcohol use.  She reports that she is currently just feeling achy all over lightheaded fatigued having mild headache cough. No abdominal pain  Past Medical History:  Diagnosis Date  . Bipolar 1 disorder ([place])   . Depression   . Hypertension   . Neuropathy   . Schizophrenia ([place])   . Thyroid disease     Patient Active Problem List   Diagnosis Date Noted  . MDD (major depressive disorder), recurrent, severe, with psychosis ([place]) 12/05/2017  . Essential hypertension 11/17/2017  . Asthma 11/17/2017  . 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 Cocaine use disorder, severe, dependence ([place]) 11/17/2017  . Opioid use disorder, severe, dependence ([place]) 11/17/2017  . Severe episode of recurrent major depressive disorder, without psychotic features ([place]) 11/16/2017  . Opiate abuse, continuous ([place]) 11/16/2017  . Cocaine abuse ([place]) 11/16/2017  . Suicidal ideation 11/16/2017  . Chronic pain 11/16/2017    Past Surgical History:  Procedure Laterality Date  . ABDOMINAL HYSTERECTOMY    . COLONOSCOPY N/A  11/23/2017   Procedure: COLONOSCOPY;  Surgeon: Lin Landsman, MD;  Location: [facility] ENDOSCOPY;  Service: Gastroenterology;  Laterality: N/A;  . COLONOSCOPY N/A 11/24/2017   Procedure: COLONOSCOPY;  Surgeon: Lin Landsman, MD;  Location: [facility] ENDOSCOPY;  Service: Gastroenterology;  Laterality: N/A;  . KNEE SURGERY Right   . TONSILLECTOMY      Prior to Admission medications   Medication Sig Start Date End Date Taking? Authorizing Provider  albuterol (PROVENTIL HFA;VENTOLIN HFA) 108 (90 Base) MCG/ACT inhaler Inhale 1-2 puffs into the lungs every 6 (six) hours as needed for wheezing or shortness of breath. 05/14/18   Sable Feil, PA-C  ALPRAZolam Duanne Moron) 0.5 MG tablet Take 0.5 mg by mouth at bedtime as needed for sleep.     [provider]  ALPRAZolam Duanne Moron) 1 MG tablet Take 1 mg by mouth at bedtime as needed.     [provider]  amitriptyline (ELAVIL) 25 MG tablet Take 1 tablet (25 mg total) by mouth at bedtime. 12/06/17   [name], MD  benzonatate (TESSALON PERLES) 100 MG capsule Take 2 capsules (200 mg total) by mouth 3 (three) times daily as needed. 05/14/18 05/14/19  Sable Feil, PA-C  cephALEXin (KEFLEX) 500 MG capsule Take 1 capsule (500 mg total) by mouth 2 (two) times daily for 10 days. 09/14/18 09/24/18  Delman Kitten, MD  gabapentin (NEURONTIN) 300 MG capsule Take 300 mg by mouth 2 (two) times daily.  05/09/10   [provider]  gabapentin (NEURONTIN) 400 MG capsule Take 1 capsule (400 mg total) by mouth 3 (three) times daily  after meals. 12/06/17   [name], MD  hydrochlorothiazide (HYDRODIURIL) 25 MG tablet Take 1 tablet (25 mg total) by mouth daily. 12/06/17   [name], MD  HYDROcodone-acetaminophen (NORCO) 10-325 MG tablet Take 1 tablet by mouth every 6 (six) hours as needed.  10/18/12   [provider]  hydrOXYzine (ATARAX/VISTARIL) 50 MG tablet Take 1 tablet (50 mg total) by mouth 3 (three) times daily as needed for  anxiety or itching. 12/06/17   [name], MD  ibuprofen (ADVIL,MOTRIN) 600 MG tablet Take 1 tablet (600 mg total) by mouth every 6 (six) hours as needed. 12/27/17   Rudene Re, MD  levothyroxine (SYNTHROID, LEVOTHROID) 100 MCG tablet Take 100 mcg by mouth daily before breakfast.     [provider]  levothyroxine (SYNTHROID, LEVOTHROID) 50 MCG tablet Take 1 tablet (50 mcg total) by mouth daily before breakfast. 12/06/17   [name], MD  lisinopril (PRINIVIL,ZESTRIL) 20 MG tablet Take 20 mg by mouth daily.     [provider]  lisinopril (PRINIVIL,ZESTRIL) 5 MG tablet Take 1 tablet (5 mg total) by mouth daily. 12/06/17   [name], MD  lisinopril-hydrochlorothiazide (PRINZIDE,ZESTORETIC) 20-25 MG tablet Take 1 tablet by mouth daily.     [provider]  loratadine (CLARITIN) 10 MG tablet Take 1 tablet (10 mg total) by mouth daily. 12/06/17   [name], MD  methylPREDNISolone (MEDROL DOSEPAK) 4 MG TBPK tablet Take [place] dose as directed 05/14/18   Sable Feil, PA-C  ondansetron (ZOFRAN ODT) 4 MG disintegrating tablet Take 1 tablet (4 mg total) by mouth every 8 (eight) hours as needed for nausea or vomiting. 05/24/18   Eula Listen, MD  ondansetron (ZOFRAN) 4 MG tablet Take 1 tablet (4 mg total) by mouth daily as needed for nausea or vomiting. 09/14/18 09/14/19  Delman Kitten, MD  oxyCODONE (OXY IR/ROXICODONE) 5 MG immediate release tablet Take 5 mg by mouth every 6 (six) hours as needed.  07/18/14   [provider]  Oxycodone HCl 10 MG TABS Take 10 mg by mouth every 6 (six) hours as needed (pain).  10/10/12   [provider]  polyethylene glycol (MIRALAX / GLYCOLAX) packet Take 17 g by mouth daily. 12/06/17   [name], MD  potassium chloride (K-DUR) 10 MEQ tablet Take 2 tablets (20 mEq total) by mouth daily for 3 days. 05/24/18 05/27/18  Eula Listen, MD  promethazine (PHENERGAN) 12.5 MG tablet Take by mouth. 10/04/12    [provider]  QUEtiapine (SEROQUEL) 200 MG tablet Take 200 mg by mouth at bedtime.     [provider]  QUEtiapine (SEROQUEL) 400 MG tablet Take 1 tablet (400 mg total) by mouth at bedtime. 12/06/17   [name], MD  traMADol (ULTRAM) 50 MG tablet Take 50 mg by mouth every 6 (six) hours as needed for moderate pain.     [provider]  traMADol (ULTRAM) 50 MG tablet Take 1 tablet (50 mg total) by mouth every 12 (twelve) hours as needed. 05/14/18   Sable Feil, PA-C  triamcinolone ointment (KENALOG) 0.1 % Apply topically 3 (three) times daily as needed (apply to rash-lower neck, upper chest, few spots on bilateral and upper and lower extremities). 12/06/17   [name], MD    Allergies Amoxicillin; Effexor [venlafaxine]; Ketorolac; Lamotrigine; and Sulfa antibiotics  No family history on file.  Social History Social History   Tobacco Use  . Smoking status: Never Smoker  . Smokeless tobacco: Never Used  Substance Use  Topics  . Alcohol use: No  . Drug use: Not Currently    Comment: crack and opiates    Review of Systems Constitutional: Present chills some fatigue Eyes: No visual changes. ENT: No sore throat. Cardiovascular: Denies chest pain. Respiratory: Denies shortness of breath.  Occasional dry cough. Gastrointestinal: No abdominal pain.  Occasional nausea but no vomiting. Genitourinary: Some discomfort with urination musculoskeletal: Negative for back pain. Skin: Negative for rash. Neurological: Negative for areas of focal weakness or numbness.    ____________________________________________   PHYSICAL EXAM:  VITAL SIGNS: ED Triage Vitals  Enc Vitals Group     BP 09/14/18 1635 (!) 172/107     Pulse Rate 09/14/18 1635 (!) 109     Resp 09/14/18 1635 (!) 22     Temp 09/14/18 1635 (!) 102.3 F (39.1 C)     Temp Source 09/14/18 1635 Oral     SpO2 09/14/18 1635 100 %     Weight 09/14/18 1636 150 lb (68 kg)     Height 09/14/18  1636 5\' 2"  (1.575 m)     Head Circumference --      Peak Flow --      Pain Score 09/14/18 1636 10     Pain Loc --      Pain Edu? --      Excl. in [place]? --     Constitutional: Alert and oriented. Well appearing and in no acute distress.  Ports notable myalgias. Eyes: Conjunctivae are normal. Head: Atraumatic. Nose: No congestion/rhinnorhea. Mouth/Throat: Mucous membranes are slightly dry. Neck: No stridor.  Cardiovascular: Slightly tachycardic rate, regular rhythm. Grossly normal heart sounds.  Good peripheral circulation. Respiratory: Normal respiratory effort.  No retractions. Lungs CTAB. Gastrointestinal: Soft and nontender. No distention.  She has moderate right CVA tenderness, none on the left.  No pain or discomfort at McBurney's point.  No rebound guarding or peritonitis any quadrant. Musculoskeletal: No lower extremity tenderness nor edema. Neurologic:  Normal speech and language. No gross focal neurologic deficits are appreciated.  Skin:  Skin is warm, dry and intact. No rash noted. Psychiatric: Mood and affect are normal. Speech and behavior are normal.  ____________________________________________   LABS (all labs ordered are listed, but only abnormal results are displayed)  Labs Reviewed  COMPREHENSIVE METABOLIC PANEL - Abnormal; Notable for the following components:      Result Value   Sodium 133 (*)    Glucose, Bld 123 (*)    BUN 25 (*)    Creatinine, Ser 1.13 (*)    Total Protein 8.5 (*)    GFR calc non Af Amer 56 (*)    All other components within normal limits  CBC WITH DIFFERENTIAL/PLATELET - Abnormal; Notable for the following components:   MCH 25.7 (*)    RDW 16.7 (*)    Neutro Abs 8.1 (*)    Lymphs Abs 0.6 (*)    All other components within normal limits  URINALYSIS, COMPLETE (UACMP) WITH MICROSCOPIC - Abnormal; Notable for the following components:   Color, Urine STRAW (*)    APPearance CLEAR (*)    Specific Gravity, Urine 1.004 (*)    Hgb urine  dipstick MODERATE (*)    Nitrite POSITIVE (*)    Leukocytes,Ua MODERATE (*)    Bacteria, UA RARE (*)    All other components within normal limits  SARS CORONAVIRUS 2 (HOSPITAL ORDER, [place] [facility])  CULTURE, BLOOD (ROUTINE X 2)  CULTURE, BLOOD (ROUTINE X 2)  LACTIC ACID, PLASMA  LACTIC ACID, PLASMA  FIBRIN DERIVATIVES D-DIMER ([facility] ONLY)  PROCALCITONIN  LACTATE DEHYDROGENASE  FERRITIN  TRIGLYCERIDES  FIBRINOGEN  C-REACTIVE PROTEIN  PREGNANCY, URINE   ____________________________________________  EKG   ____________________________________________  RADIOLOGY  Dg Chest Port 1 View  Result Date: 09/14/2018 CLINICAL DATA:  Body aches and fever EXAM: PORTABLE CHEST 1 VIEW COMPARISON:  09/07/2018 FINDINGS: The heart size and mediastinal contours are within normal limits. Both lungs are clear. The visualized skeletal structures are unremarkable. IMPRESSION: No active disease. Electronically Signed   By: Ulyses Jarred M.D.   On: 09/14/2018 17:47   Ct Renal Stone Study  Result Date: 09/14/2018 CLINICAL DATA:  Body aches, fever, bilateral flank pain EXAM: CT ABDOMEN AND PELVIS WITHOUT CONTRAST TECHNIQUE: Multidetector CT imaging of the abdomen and pelvis was performed following the standard protocol without IV contrast. COMPARISON:  None. FINDINGS: Lower chest: Lung bases are clear. No effusions. Heart is normal size. Hepatobiliary: No focal liver abnormality is seen. Status post cholecystectomy. No biliary dilatation. Pancreas: No focal abnormality or ductal dilatation. Spleen: No focal abnormality.  Normal size. Adrenals/Urinary Tract: Stranding around the right kidney with mild fullness of the right renal collecting system and proximal ureter. No visible stones. No stones or hydronephrosis on the left. Adrenal glands and urinary bladder unremarkable. Stomach/Bowel: Moderate stool in the colon. The appendix is mildly prominent with the tip of the appendix measuring up  to 9 mm. No surrounding inflammation. Stomach, large and small bowel grossly unremarkable. Vascular/Lymphatic: No evidence of aneurysm or adenopathy. Reproductive: Prior hysterectomy.  No adnexal masses. Other: No free fluid or free air. Musculoskeletal: No acute bony abnormality. IMPRESSION: Mild stranding around the right kidney and proximal right ureter with mild fullness of the renal collecting system and proximal right ureter. No visible stones. This could be related to recently passed stone or pyelonephritis. Prominence of the tip of the appendix measuring up to 9 mm. No surrounding inflammation. I suspect this is within normal limits although tip appendicitis is difficult to completely exclude. Moderate stool burden. Electronically Signed   By: Rolm Baptise M.D.   On: 09/14/2018 19:59     Imaging results reviewed  Presentation appears most consistent with pyelonephritis, right-sided CVA tenderness.  No pain or discomfort on abdominal examination including no pain at right lower quadrant ____________________________________________   PROCEDURES  Procedure(s) performed: None  Procedures  Critical Care performed: No  ____________________________________________   INITIAL IMPRESSION / ASSESSMENT AND PLAN / ED COURSE  Pertinent labs & imaging results that were available during my care of the patient were reviewed by me and considered in my medical decision making (see chart for details).   Patient notes for fever chills body aches, some dysuria over the last day.  Right-sided CVA tenderness on exam, does not appear acutely toxic but is definitely febrile and tachycardic on initial evaluation.  She is alert and oriented and able to ambulate without distress.  Feel that she needs exclusion for coronavirus given her presentation, will send a rapid in the event she does need to be admitted as she is tachycardic and febrile on presentation.  Slight cough but reassuring respiratory exam.  Fully  alert and oriented no rash.  Headache, but associated with chills aches and dysuria with some right flank discomfort  Clinical Course as of Sep 13 2120  Fri Sep 14, 2018  1919 Patient is ambulatory, vital signs improving including fever and heart rate.  With antipyretic her temperature has improved and so is her heart  rate.  She appears improved but still having pain she reports a fair amount primarily in the right flank region and does have right-sided CVA tenderness.  I suspect based on urinalysis that she may have pyelonephritis.  We have ordered a CT scan to further evaluate, COVID also pending   [MQ]    Clinical Course User Index [MQ] Delman Kitten, MD    MAEBEL MARASCO was evaluated in Emergency Department on 09/14/2018 for the symptoms described in the history of present illness. She was evaluated in the context of the global COVID-19 pandemic, which necessitated consideration that the patient might be at risk for infection with the SARS-CoV-2 virus that causes COVID-19. Institutional protocols and algorithms that pertain to the evaluation of patients at risk for COVID-19 are in a state of rapid change based on information released by regulatory bodies including the CDC and federal and state organizations. These policies and algorithms were followed during the patient's care in the ED.  ----------------------------------------- 9:22 PM on 09/14/2018 -----------------------------------------  Patient appears much improved.  Ambulating requesting something to eat.  She is been back and forth to the bathroom reports lightheadedness is gone the headache is much better, and is comfortable with the plan for discharge.  Reports previous urinary tract infections in the past, will get her prescription [facility].  Careful return precautions discussed with the patient is in agreement.  She appears much improved.    Vital signs normalized  Return precautions and treatment  recommendations and follow-up discussed with the patient who is agreeable with the plan.   ____________________________________________   FINAL CLINICAL IMPRESSION(S) / ED DIAGNOSES  Final diagnoses:  Pyelonephritis, acute  Pyelonephritis, right-sided      Note:  This document was prepared using Dragon voice recognition software and may include unintentional dictation errors      Delman Kitten, MD 09/14/18 2123

## 2018-09-19 LAB — CULTURE, BLOOD (ROUTINE X 2)
Culture: NO GROWTH
Culture: NO GROWTH

## 2018-12-18 ENCOUNTER — Inpatient Hospital Stay
Admission: AD | Admit: 2018-12-18 | Discharge: 2018-12-21 | DRG: 885 | Disposition: A | Discharge status: Home / Self Care | Payer: Medicaid Other | Source: Intra-hospital | Attending: Psychiatry | Admitting: Psychiatry

## 2018-12-18 ENCOUNTER — Other Ambulatory Visit: Payer: Self-pay | Admitting: Family

## 2018-12-18 ENCOUNTER — Emergency Department
Admission: EM | Admit: 2018-12-18 | Discharge: 2018-12-18 | Disposition: A | Discharge status: Other Acute Inpatient Hospital | Payer: Medicaid Other | Attending: Emergency Medicine | Admitting: Emergency Medicine

## 2018-12-18 ENCOUNTER — Other Ambulatory Visit: Payer: Self-pay

## 2018-12-18 ENCOUNTER — Encounter: Payer: Self-pay | Admitting: Emergency Medicine

## 2018-12-18 DIAGNOSIS — R45851 Suicidal ideations: Secondary | ICD-10-CM | POA: Insufficient documentation

## 2018-12-18 DIAGNOSIS — F329 Major depressive disorder, single episode, unspecified: Secondary | ICD-10-CM

## 2018-12-18 DIAGNOSIS — F332 Major depressive disorder, recurrent severe without psychotic features: Secondary | ICD-10-CM

## 2018-12-18 DIAGNOSIS — F1120 Opioid dependence, uncomplicated: Secondary | ICD-10-CM | POA: Insufficient documentation

## 2018-12-18 DIAGNOSIS — J45909 Unspecified asthma, uncomplicated: Secondary | ICD-10-CM | POA: Insufficient documentation

## 2018-12-18 DIAGNOSIS — I10 Essential (primary) hypertension: Secondary | ICD-10-CM | POA: Diagnosis present

## 2018-12-18 DIAGNOSIS — F1410 Cocaine abuse, uncomplicated: Secondary | ICD-10-CM | POA: Diagnosis present

## 2018-12-18 DIAGNOSIS — F319 Bipolar disorder, unspecified: Secondary | ICD-10-CM | POA: Diagnosis present

## 2018-12-18 DIAGNOSIS — F1420 Cocaine dependence, uncomplicated: Secondary | ICD-10-CM | POA: Insufficient documentation

## 2018-12-18 DIAGNOSIS — Z79899 Other long term (current) drug therapy: Secondary | ICD-10-CM | POA: Insufficient documentation

## 2018-12-18 DIAGNOSIS — F1123 Opioid dependence with withdrawal: Secondary | ICD-10-CM | POA: Diagnosis present

## 2018-12-18 DIAGNOSIS — Z20828 Contact with and (suspected) exposure to other viral communicable diseases: Secondary | ICD-10-CM | POA: Diagnosis not present

## 2018-12-18 DIAGNOSIS — F1994 Other psychoactive substance use, unspecified with psychoactive substance-induced mood disorder: Secondary | ICD-10-CM | POA: Diagnosis present

## 2018-12-18 DIAGNOSIS — F32A Depression, unspecified: Secondary | ICD-10-CM

## 2018-12-18 DIAGNOSIS — F1910 Other psychoactive substance abuse, uncomplicated: Secondary | ICD-10-CM

## 2018-12-18 DIAGNOSIS — F1110 Opioid abuse, uncomplicated: Secondary | ICD-10-CM | POA: Diagnosis present

## 2018-12-18 LAB — URINE DRUG SCREEN, QUALITATIVE (ARMC ONLY)
Amphetamines, Ur Screen: NOT DETECTED
Barbiturates, Ur Screen: NOT DETECTED
Benzodiazepine, Ur Scrn: NOT DETECTED
Cannabinoid 50 Ng, Ur ~~LOC~~: NOT DETECTED
Cocaine Metabolite,Ur ~~LOC~~: POSITIVE — AB
MDMA (Ecstasy)Ur Screen: NOT DETECTED
Methadone Scn, Ur: NOT DETECTED
Opiate, Ur Screen: POSITIVE — AB
Phencyclidine (PCP) Ur S: NOT DETECTED
Tricyclic, Ur Screen: NOT DETECTED

## 2018-12-18 LAB — CBC
HCT: 39.5 % (ref 36.0–46.0)
Hemoglobin: 12.4 g/dL (ref 12.0–15.0)
MCH: 26.7 pg (ref 26.0–34.0)
MCHC: 31.4 g/dL (ref 30.0–36.0)
MCV: 85.1 fL (ref 80.0–100.0)
Platelets: 228 10*3/uL (ref 150–400)
RBC: 4.64 MIL/uL (ref 3.87–5.11)
RDW: 15.5 % (ref 11.5–15.5)
WBC: 4.3 10*3/uL (ref 4.0–10.5)
nRBC: 0 % (ref 0.0–0.2)

## 2018-12-18 LAB — COMPREHENSIVE METABOLIC PANEL
ALT: 14 U/L (ref 0–44)
AST: 20 U/L (ref 15–41)
Albumin: 4.3 g/dL (ref 3.5–5.0)
Alkaline Phosphatase: 63 U/L (ref 38–126)
Anion gap: 9 (ref 5–15)
BUN: 22 mg/dL — ABNORMAL HIGH (ref 6–20)
CO2: 25 mmol/L (ref 22–32)
Calcium: 9.2 mg/dL (ref 8.9–10.3)
Chloride: 107 mmol/L (ref 98–111)
Creatinine, Ser: 0.81 mg/dL (ref 0.44–1.00)
GFR calc Af Amer: >60 mL/min (ref >60)
GFR calc non Af Amer: >60 mL/min (ref >60)
Glucose, Bld: 112 mg/dL — ABNORMAL HIGH (ref 70–99)
Potassium: 4.1 mmol/L (ref 3.5–5.1)
Sodium: 141 mmol/L (ref 135–145)
Total Bilirubin: 0.4 mg/dL (ref 0.3–1.2)
Total Protein: 8.2 g/dL — ABNORMAL HIGH (ref 6.5–8.1)

## 2018-12-18 LAB — SALICYLATE LEVEL: Salicylate Lvl: <7 mg/dL (ref 2.8–30.0)

## 2018-12-18 LAB — ETHANOL: Alcohol, Ethyl (B): <10 mg/dL (ref <10)

## 2018-12-18 LAB — ACETAMINOPHEN LEVEL: Acetaminophen (Tylenol), Serum: <10 ug/mL — ABNORMAL LOW (ref 10–30)

## 2018-12-18 LAB — SARS CORONAVIRUS 2 BY RT PCR (HOSPITAL ORDER, PERFORMED IN ~~LOC~~ HOSPITAL LAB): SARS Coronavirus 2: NEGATIVE

## 2018-12-18 MED ORDER — HYDROXYZINE HCL 50 MG PO TABS
50.0000 mg | ORAL_TABLET | Freq: Three times a day (TID) | ORAL | Status: DC | PRN
  Administered 2018-12-18 – 2018-12-20 (×4): 50 mg via ORAL
  Filled 2018-12-18 (×4): qty 1

## 2018-12-18 MED ORDER — IBUPROFEN 800 MG PO TABS
400.0000 mg | ORAL_TABLET | Freq: Once | ORAL | Status: AC
  Administered 2018-12-18: 19:00:00 400 mg via ORAL
  Filled 2018-12-18: qty 1

## 2018-12-18 MED ORDER — TRAZODONE HCL 100 MG PO TABS
100.0000 mg | ORAL_TABLET | Freq: Every evening | ORAL | Status: DC | PRN
  Administered 2018-12-20: 21:00:00 100 mg via ORAL
  Filled 2018-12-18: qty 1

## 2018-12-18 MED ORDER — MAGNESIUM HYDROXIDE 400 MG/5ML PO SUSP: 30.0000 mL | Freq: Every day | ORAL | Status: DC | PRN

## 2018-12-18 MED ORDER — ACETAMINOPHEN 325 MG PO TABS
650.0000 mg | ORAL_TABLET | Freq: Four times a day (QID) | ORAL | Status: DC | PRN
  Administered 2018-12-20: 650 mg via ORAL
  Filled 2018-12-18: qty 2

## 2018-12-18 MED ORDER — ALUM & MAG HYDROXIDE-SIMETH 200-200-20 MG/5ML PO SUSP: 30.0000 mL | ORAL | Status: DC | PRN

## 2018-12-18 MED ORDER — LISINOPRIL 5 MG PO TABS: 10.0000 mg | ORAL_TABLET | Freq: Every morning | ORAL | Status: DC

## 2018-12-18 MED ORDER — IBUPROFEN 800 MG PO TABS
800.0000 mg | ORAL_TABLET | Freq: Three times a day (TID) | ORAL | Status: DC
  Administered 2018-12-18: 22:00:00 800 mg via ORAL
  Filled 2018-12-18: qty 1

## 2018-12-18 MED ORDER — GABAPENTIN 300 MG PO CAPS
600.0000 mg | ORAL_CAPSULE | Freq: Once | ORAL | Status: AC
  Administered 2018-12-18: 19:00:00 600 mg via ORAL
  Filled 2018-12-18: qty 2

## 2018-12-18 MED ORDER — QUETIAPINE FUMARATE 200 MG PO TABS
400.0000 mg | ORAL_TABLET | Freq: Every day | ORAL | Status: DC
  Administered 2018-12-18: 22:00:00 400 mg via ORAL
  Filled 2018-12-18: qty 2

## 2018-12-18 MED ORDER — CLONIDINE HCL 0.1 MG PO TABS
0.1000 mg | ORAL_TABLET | Freq: Once | ORAL | Status: AC
  Administered 2018-12-18: 0.1 mg via ORAL
  Filled 2018-12-18: qty 1

## 2018-12-18 NOTE — Consult Note (Signed)
[facility] Consult   Reason for Consult:  Suicidal ideations Referring Physician:  Dr. 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 female patient admitted with worsening depression, suicidal ideations, and polysubstance abuse. Patient reports history of depression that has become worse after discontinuation her medications. She identifies her symptoms as  Weight loss, decreased appetite, poor sleep, sadness, guilt, and depressed mood. She reports ongoing suicidal ideation that are intermittent. She denies any recent self harm, homicidal ideations, and or hallucinations. SHe states she had improvement with her therapy at [facility].  Past Psychiatric History: MDD, Polysubstance abuse, Bipolar, schizophrenia  Risk to Self:  Yes Risk to Others:  NO Prior Inpatient Therapy:  [facility] 11/2017, Several rehab and long term treatment Prior Outpatient Therapy:  [facility]  Past Medical History:  Past Medical History:  Diagnosis Date  . Bipolar 1 disorder ([place])   . Depression   . Hypertension   . Neuropathy   . Schizophrenia ([place])   . Thyroid disease     Past Surgical History:  Procedure Laterality Date  . ABDOMINAL HYSTERECTOMY    . COLONOSCOPY N/A 11/23/2017   Procedure: COLONOSCOPY;  Surgeon: Lin Landsman, MD;  Location: [facility] ENDOSCOPY;  Service: Gastroenterology;  Laterality: N/A;  . COLONOSCOPY N/A 11/24/2017   Procedure: COLONOSCOPY;  Surgeon: Lin Landsman, MD;  Location: [facility] ENDOSCOPY;  Service: Gastroenterology;  Laterality:  N/A;  . KNEE SURGERY Right   . TONSILLECTOMY     Family History: No family history on file. Family Psychiatric  History: As per patient she has 7 sisters, each of whom suffer from a mental illness.  Social History:  Social History   Substance and Sexual Activity  Alcohol Use No     Social History   Substance and Sexual Activity  Drug Use Not Currently   Comment: crack and opiates    Social History   Socioeconomic History  . Marital status: Legally Separated    Spouse name: Not on file  . Number of children: Not on file  . Years of education: Not on file  . Highest education level: Not on file  Occupational History  . Not on file  Social Needs  . Financial resource strain: Not on file  . Food insecurity    Worry: Not on file    Inability: Not on file  . Transportation needs    Medical: Not on file    Non-medical: Not on file  Tobacco Use  . Smoking status: Never Smoker  . Smokeless tobacco: Never Used  Substance and Sexual Activity  . Alcohol use: No  . Drug use: Not Currently    Comment: crack and opiates  . Sexual activity: Not on file  Lifestyle  . Physical activity    Days per week: Not on file    Minutes per session: Not on file  . Stress: Not on file  Relationships  . Social Herbalist on phone: Not on file    Gets together: Not on file    Attends religious service: Not on file    Active member of club or organization:  Not on file    Attends meetings of clubs or organizations: Not on file    Relationship status: Not on file  Other Topics Concern  . Not on file  Social History Narrative  . Not on file   Additional Social History:    Allergies:   Allergies  Allergen Reactions  . Amoxicillin Rash  . Effexor [Venlafaxine] Rash    "whelps"  . Ketorolac Itching  . Lamotrigine Rash  . Sulfa Antibiotics Itching    Labs:  Results for orders placed or performed during the hospital encounter of 12/18/18 (from the past 48 hour(s))   Comprehensive metabolic panel     Status: Abnormal   Collection Time: 12/18/18  1:59 PM  Result Value Ref Range   Sodium 141 135 - 145 mmol/L   Potassium 4.1 3.5 - 5.1 mmol/L   Chloride 107 98 - 111 mmol/L   CO2 25 22 - 32 mmol/L   Glucose, Bld 112 (H) 70 - 99 mg/dL   BUN 22 (H) 6 - 20 mg/dL   Creatinine, Ser 0.81 0.44 - 1.00 mg/dL   Calcium 9.2 8.9 - 10.3 mg/dL   Total Protein 8.2 (H) 6.5 - 8.1 g/dL   Albumin 4.3 3.5 - 5.0 g/dL   AST 20 15 - 41 U/L   ALT 14 0 - 44 U/L   Alkaline Phosphatase 63 38 - 126 U/L   Total Bilirubin 0.4 0.3 - 1.2 mg/dL   GFR calc non Af Amer >60 >60 mL/min   GFR calc Af Amer >60 >60 mL/min   Anion gap 9 5 - 15    Comment: Performed at [facility], [address] [place], [place] [address]  Ethanol     Status: None   Collection Time: 12/18/18  1:59 PM  Result Value Ref Range   Alcohol, Ethyl (B) <10 <10 mg/dL    Comment: (NOTE) Lowest detectable limit for serum alcohol is 10 mg/dL. For medical purposes only. Performed at [facility], [address] [place], [place] [address]   Salicylate level     Status: None   Collection Time: 12/18/18  1:59 PM  Result Value Ref Range   Salicylate Lvl <3.6 2.8 - 30.0 mg/dL    Comment: Performed at [facility], [address] [place], [place] [address]  Acetaminophen level     Status: Abnormal   Collection Time: 12/18/18  1:59 PM  Result Value Ref Range   Acetaminophen (Tylenol), Serum <10 (L) 10 - 30 ug/mL    Comment: (NOTE) Therapeutic concentrations vary significantly. A range of 10-30 ug/mL  may be an effective concentration for many patients. However, some  are best treated at concentrations outside of this range. Acetaminophen concentrations >150 ug/mL at 4 hours after ingestion  and >50 ug/mL at 12 hours after ingestion are often associated with  toxic reactions. Performed at [facility], [address] [place], [place] [address]   cbc     Status: None    Collection Time: 12/18/18  1:59 PM  Result Value Ref Range   WBC 4.3 4.0 - 10.5 K/uL   RBC 4.64 3.87 - 5.11 MIL/uL   Hemoglobin 12.4 12.0 - 15.0 g/dL   HCT 39.5 36.0 - 46.0 %   MCV 85.1 80.0 - 100.0 fL   MCH 26.7 26.0 - 34.0 pg   MCHC 31.4 30.0 - 36.0 g/dL   RDW 15.5 11.5 - 15.5 %   Platelets 228 150 - 400 K/uL  nRBC 0.0 0.0 - 0.2 %    Comment: Performed at [facility], [address] [place], [place] [address]  Urine Drug Screen, Qualitative     Status: Abnormal   Collection Time: 12/18/18  1:59 PM  Result Value Ref Range   Tricyclic, Ur Screen NONE DETECTED NONE DETECTED   Amphetamines, Ur Screen NONE DETECTED NONE DETECTED   MDMA (Ecstasy)Ur Screen NONE DETECTED NONE DETECTED   Cocaine Metabolite,Ur [place] POSITIVE (A) NONE DETECTED   Opiate, Ur Screen POSITIVE (A) NONE DETECTED   Phencyclidine (PCP) Ur S NONE DETECTED NONE DETECTED   Cannabinoid 50 Ng, Ur [place] NONE DETECTED NONE DETECTED   Barbiturates, Ur Screen NONE DETECTED NONE DETECTED   Benzodiazepine, Ur Scrn NONE DETECTED NONE DETECTED   Methadone Scn, Ur NONE DETECTED NONE DETECTED    Comment: (NOTE) Tricyclics + metabolites, urine    Cutoff 1000 ng/mL Amphetamines + metabolites, urine  Cutoff 1000 ng/mL MDMA (Ecstasy), urine              Cutoff 500 ng/mL Cocaine Metabolite, urine          Cutoff 300 ng/mL Opiate + metabolites, urine        Cutoff 300 ng/mL Phencyclidine (PCP), urine         Cutoff 25 ng/mL Cannabinoid, urine                 Cutoff 50 ng/mL Barbiturates + metabolites, urine  Cutoff 200 ng/mL Benzodiazepine, urine              Cutoff 200 ng/mL Methadone, urine                   Cutoff 300 ng/mL The urine drug screen provides only a preliminary, unconfirmed analytical test result and should not be used for non-medical purposes. Clinical consideration and professional judgment should be applied to any positive drug screen result due to possible interfering substances. A more specific  alternate chemical method must be used in order to obtain a confirmed analytical result. Gas chromatography / mass spectrometry (GC/MS) is the preferred confirmat ory method. Performed at [facility], [address] [place], [place] [address]     No current facility-administered medications for this encounter.    Current Outpatient Medications  Medication Sig Dispense Refill  . albuterol (PROVENTIL HFA;VENTOLIN HFA) 108 (90 Base) MCG/ACT inhaler Inhale 1-2 puffs into the lungs every 6 (six) hours as needed for wheezing or shortness of breath. 1 Inhaler 0  . ALPRAZolam (XANAX) 0.5 MG tablet Take 0.5 mg by mouth at bedtime as needed for sleep.     Marland Kitchen ALPRAZolam (XANAX) 1 MG tablet Take 1 mg by mouth at bedtime as needed.     Marland Kitchen amitriptyline (ELAVIL) 25 MG tablet Take 1 tablet (25 mg total) by mouth at bedtime. 30 tablet 0  . benzonatate (TESSALON PERLES) 100 MG capsule Take 2 capsules (200 mg total) by mouth 3 (three) times daily as needed. 30 capsule 0  . gabapentin (NEURONTIN) 300 MG capsule Take 300 mg by mouth 2 (two) times daily.     Marland Kitchen gabapentin (NEURONTIN) 400 MG capsule Take 1 capsule (400 mg total) by mouth 3 (three) times daily after meals. 90 capsule 0  . hydrochlorothiazide (HYDRODIURIL) 25 MG tablet Take 1 tablet (25 mg total) by mouth daily. 30 tablet 0  . HYDROcodone-acetaminophen (NORCO) 10-325 MG tablet Take 1 tablet by mouth every 6 (six) hours as needed.     . hydrOXYzine (  ATARAX/VISTARIL) 50 MG tablet Take 1 tablet (50 mg total) by mouth 3 (three) times daily as needed for anxiety or itching. 90 tablet 0  . ibuprofen (ADVIL,MOTRIN) 600 MG tablet Take 1 tablet (600 mg total) by mouth every 6 (six) hours as needed. 20 tablet 0  . levothyroxine (SYNTHROID, LEVOTHROID) 100 MCG tablet Take 100 mcg by mouth daily before breakfast.     . levothyroxine (SYNTHROID, LEVOTHROID) 50 MCG tablet Take 1 tablet (50 mcg total) by mouth daily before breakfast. 30 tablet 0  .  lisinopril (PRINIVIL,ZESTRIL) 20 MG tablet Take 20 mg by mouth daily.     Marland Kitchen lisinopril (PRINIVIL,ZESTRIL) 5 MG tablet Take 1 tablet (5 mg total) by mouth daily. 30 tablet 0  . lisinopril-hydrochlorothiazide (PRINZIDE,ZESTORETIC) 20-25 MG tablet Take 1 tablet by mouth daily.     Marland Kitchen loratadine (CLARITIN) 10 MG tablet Take 1 tablet (10 mg total) by mouth daily. 30 tablet 0  . methylPREDNISolone (MEDROL DOSEPAK) 4 MG TBPK tablet Take Tapered dose as directed 21 tablet 0  . ondansetron (ZOFRAN ODT) 4 MG disintegrating tablet Take 1 tablet (4 mg total) by mouth every 8 (eight) hours as needed for nausea or vomiting. 15 tablet 0  . ondansetron (ZOFRAN) 4 MG tablet Take 1 tablet (4 mg total) by mouth daily as needed for nausea or vomiting. 30 tablet 0  . oxyCODONE (OXY IR/ROXICODONE) 5 MG immediate release tablet Take 5 mg by mouth every 6 (six) hours as needed.     . Oxycodone HCl 10 MG TABS Take 10 mg by mouth every 6 (six) hours as needed (pain).     . polyethylene glycol (MIRALAX / GLYCOLAX) packet Take 17 g by mouth daily. 30 each 0  . potassium chloride (K-DUR) 10 MEQ tablet Take 2 tablets (20 mEq total) by mouth daily for 3 days. 6 tablet 0  . promethazine (PHENERGAN) 12.5 MG tablet Take by mouth.    . QUEtiapine (SEROQUEL) 200 MG tablet Take 200 mg by mouth at bedtime.     Marland Kitchen QUEtiapine (SEROQUEL) 400 MG tablet Take 1 tablet (400 mg total) by mouth at bedtime. 30 tablet 0  . traMADol (ULTRAM) 50 MG tablet Take 50 mg by mouth every 6 (six) hours as needed for moderate pain.     . traMADol (ULTRAM) 50 MG tablet Take 1 tablet (50 mg total) by mouth every 12 (twelve) hours as needed. 12 tablet 0  . triamcinolone ointment (KENALOG) 0.1 % Apply topically 3 (three) times daily as needed (apply to rash-lower neck, upper chest, few spots on bilateral and upper and lower extremities). 30 g 0    Musculoskeletal: Strength & Muscle Tone: within normal limits Gait & Station: normal Patient leans:  N/A  Psychiatric Specialty Exam: Physical Exam  ROS  Blood pressure (!) 183/109, pulse 82, temperature 98.2 F (36.8 C), temperature source Oral, resp. rate 20, height 5\' 2"  (1.575 m), SpO2 100 %.Body mass index is 27.44 kg/m.  General Appearance: Disheveled and wearing purple scrubs  Eye Contact:  Minimal  Speech:  Clear and Coherent and Normal Rate  Volume:  Normal  Mood:  Depressed  Affect:  Depressed  Thought Process:  Coherent, Linear and Descriptions of Associations: Intact  Orientation:  Full (Time, Place, and Person)  Thought Content:  Logical  Suicidal Thoughts:  Yes.  without intent/plan  Homicidal Thoughts:  No  Memory:  Immediate;   Fair Recent;   Fair  Judgement:  Impaired  Insight:  Lacking  Psychomotor Activity:  Normal  Concentration:  Concentration: Fair and Attention Span: Fair  Recall:  [employer] of Knowledge:  Fair  Language:  Fair  Akathisia:  No  Handed:  Right  AIMS (if indicated):     Assets:  Communication Skills Desire for Improvement Financial Resources/Insurance Leisure Time Physical Health Vocational/Educational  ADL's:  Intact  Cognition:  WNL  Sleep:        Treatment Plan Summary: Daily contact with patient to assess and evaluate symptoms and progress in treatment and Medication management  Disposition: Recommend psychiatric Inpatient admission when medically cleared.  Suella Broad, FNP 12/18/2018 4:28 PM

## 2018-12-18 NOTE — ED Notes (Signed)
Pt changed into burgundy scrubs. Pt's belongings placed in a belonging bag. Belongings include; black backpack with wallet, debit card, $21 in cash, pair of slippers, pair of blue socks, black tee shirt, 2 scrunches, mask, jean shorts, bra, panties, phone

## 2018-12-18 NOTE — ED Provider Notes (Signed)
[facility] Emergency Department Provider Note   ____________________________________________   First MD Initiated Contact with Patient 12/18/18 1422     (approximate)  I have reviewed the triage vital signs and the nursing notes.   HISTORY  Chief Complaint Depression    HPI Teresa Jefferson is a 53 y.o. female with history of bipolar disorder and depression who presents to the ED complaining of depression, SI, and substance abuse.  Patient reports that she has been increasingly depressed over about the past week and has begun feeling vague thoughts of harming herself.  She denies any specific plan to do so.  She does state that she has relapsed and drug use, has been using opiates, crack cocaine, and benzodiazepines.  She states she last used opiates this morning, denies any withdrawal symptoms from either opiates or benzodiazepines.  She denies any alcohol abuse.        Past Medical History:  Diagnosis Date  . Bipolar 1 disorder ([place])   . Depression   . Hypertension   . Neuropathy   . Schizophrenia ([place])   . Thyroid disease     Patient Active Problem List   Diagnosis Date Noted  . MDD (major depressive disorder), recurrent, severe, with psychosis ([place]) 12/05/2017  . Essential hypertension 11/17/2017  . Asthma 11/17/2017  . 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 Cocaine use disorder, severe, dependence ([place]) 11/17/2017  . Opioid use disorder, severe, dependence ([place]) 11/17/2017  . Severe episode of recurrent major depressive disorder, without psychotic features ([place]) 11/16/2017  . Opiate abuse, continuous ([place]) 11/16/2017  . Cocaine abuse ([place]) 11/16/2017  . Suicidal ideation 11/16/2017  . Chronic pain 11/16/2017    Past Surgical History:  Procedure Laterality Date  . ABDOMINAL HYSTERECTOMY    . COLONOSCOPY N/A 11/23/2017   Procedure: COLONOSCOPY;  Surgeon: Lin Landsman, MD;  Location[facility]  ENDOSCOPY;  Service: Gastroenterology;  Laterality: N/A;  . COLONOSCOPY N/A 11/24/2017   Procedure: COLONOSCOPY;  Surgeon: Lin Landsman, MD;  Location: [facility] ENDOSCOPY;  Service: Gastroenterology;  Laterality: N/A;  . KNEE SURGERY Right   . TONSILLECTOMY      Prior to Admission medications   Medication Sig Start Date End Date Taking? Authorizing Provider  gabapentin (NEURONTIN) 600 MG tablet Take 600 mg by mouth 3 (three) times daily.  05/09/10  Yes [provider]  ibuprofen (ADVIL) 800 MG tablet Take 800 mg by mouth every 8 (eight) hours as needed.   Yes [provider]  QUEtiapine (SEROQUEL) 400 MG tablet Take 1 tablet (400 mg total) by mouth at bedtime. 12/06/17  Yes O'Neal, Fredirick Maudlin, MD  lisinopril (PRINIVIL,ZESTRIL) 20 MG tablet Take 20 mg by mouth daily.     [provider]  Oxycodone HCl 10 MG TABS Take 10 mg by mouth every 6 (six) hours as needed (pain).  10/10/12   [provider]  promethazine (PHENERGAN) 12.5 MG tablet Take by mouth. 10/04/12   [provider]  QUEtiapine (SEROQUEL) 200 MG tablet Take 200 mg by mouth at bedtime.     [provider]  traMADol (ULTRAM) 50 MG tablet Take 50 mg by mouth every 6 (six) hours as needed for moderate pain.     [provider]    Allergies Amoxicillin, Effexor [venlafaxine], Ketorolac, Lamotrigine, and Sulfa antibiotics  No family history on file.  Social History Social History   Tobacco Use  . Smoking status: Never Smoker  . Smokeless tobacco: Never Used  Substance Use Topics  . Alcohol use: No  . Drug use: Not Currently    Comment: crack and opiates    Review of Systems  Constitutional: No fever/chills Eyes: No visual changes. ENT: No sore throat. Cardiovascular: Denies chest pain. Respiratory: Denies shortness of breath. Gastrointestinal: No abdominal pain.  No nausea, no vomiting.  No diarrhea.  No constipation. Genitourinary: Negative for dysuria.  Musculoskeletal: Negative for back pain. Skin: Negative for rash. Neurological: Negative for headaches, focal weakness or numbness. Psychiatric: Positive for depressed mood and suicidal ideation  ____________________________________________   PHYSICAL EXAM:  VITAL SIGNS: ED Triage Vitals  Enc Vitals Group     BP 12/18/18 1357 (!) 183/109     Pulse Rate 12/18/18 1353 82     Resp 12/18/18 1356 20     Temp 12/18/18 1353 98.2 F (36.8 C)     Temp Source 12/18/18 1353 Oral     SpO2 12/18/18 1353 100 %     Weight --      Height 12/18/18 1357 5\' 2"  (1.575 m)     Head Circumference --      Peak Flow --      Pain Score 12/18/18 1356 0     Pain Loc --      Pain Edu? --      Excl. in [place]? --     Constitutional: Alert and oriented. Eyes: Conjunctivae are normal. Head: Atraumatic. Nose: No congestion/rhinnorhea. Mouth/Throat: Mucous membranes are moist. Neck: Normal ROM Cardiovascular: Normal rate, regular rhythm. Grossly normal heart sounds. Respiratory: Normal respiratory effort.  No retractions. Lungs CTAB. Gastrointestinal: Soft and nontender. No distention. Genitourinary: deferred Musculoskeletal: No lower extremity tenderness nor edema. Neurologic:  Normal speech and language. No gross focal neurologic deficits are appreciated. Skin:  Skin is warm, dry and intact. No rash noted. Psychiatric: Mood and affect are normal. Speech and behavior are normal.  ____________________________________________   LABS (all labs ordered are listed, but only abnormal results are displayed)  Labs Reviewed  COMPREHENSIVE METABOLIC PANEL - Abnormal; Notable for the following components:      Result Value   Glucose, Bld 112 (*)    BUN 22 (*)    Total Protein 8.2 (*)    All other components within normal limits  ACETAMINOPHEN LEVEL - Abnormal; Notable for the following components:   Acetaminophen (Tylenol), Serum <10 (*)    All other components within normal limits  URINE DRUG SCREEN,  QUALITATIVE ([facility] ONLY) - Abnormal; Notable for the following components:   Cocaine Metabolite,Ur [place] POSITIVE (*)    Opiate, Ur Screen POSITIVE (*)    All other components within normal limits  SARS CORONAVIRUS 2 (HOSPITAL ORDER, [facility])  ETHANOL  SALICYLATE LEVEL  CBC      ____________________________________________   PROCEDURES  Procedure(s) performed (including Critical Care):  Procedures   ____________________________________________   INITIAL IMPRESSION / ASSESSMENT AND PLAN / ED COURSE       53 year old female presenting with depressed mood, SI, and substance abuse.  She denies any medical complaints at this time and there is no evidence of withdrawal.  She is medically cleared and was evaluated by psychiatry, who will seek inpatient admission here at [facility].      ____________________________________________   FINAL CLINICAL IMPRESSION(S) / ED DIAGNOSES  Final diagnoses:  Depression, unspecified depression type  Suicidal ideation  Polysubstance abuse [facility])     ED Discharge Orders    None  Note:  This document was prepared using Dragon voice recognition software and may include unintentional dictation errors.   Blake Divine, MD 12/18/18 [phone]

## 2018-12-18 NOTE — ED Notes (Signed)
ED Provider at bedside. 

## 2018-12-18 NOTE — BH Assessment (Signed)
Patient is to be admitted to [facility] BMU by Sheran Fava, NP.  Attending Physician will be Dr. Weber Cooks.   Patient has been assigned to room 304, by [facility] Nurse Coralyn Mark.   Intake Paper Work has been signed and placed on patient chart.   ER staff is aware of the admission:  Vaughan Basta, ER Secretary    Dr. Kerman Passey, ER MD   Amy, Patient's Nurse   Elberta Fortis, Patient Access

## 2018-12-18 NOTE — ED Notes (Signed)
Pt given meal tray.

## 2018-12-18 NOTE — ED Notes (Signed)
Pt given snack and soda at this time

## 2018-12-18 NOTE — BH Assessment (Signed)
Assessment Note  Teresa Jefferson is an 53 y.o. female who presents to ED with depressive sxs and suicidal ideations. Pt states she recently relapsed on opiates, benzodiazepines, and crack cocaine. Pt reports she was clean for 6 months and after missing several appointments with her outpatient provider she was discharged from their services. Pt states she was initially receiving services with [facility] and then switched her outpatient provider to [facility]. Pt is currently prescribed 12mg  of suboxene; however, after this most recent relapse she began getting her suboxene illicitly. Pt reports having passive suicidal thoughts that were triggered by her immense amount guilt after relapsing. Pt denied having a specific SI plan. She also denied past suicide attempts when speaking with this Probation officer. However, pt reports hx of past inpatient hospitalization for similar issues. Pt denied HI/AVH. She was alert and oriented x4 while logical in her thought process. Pt was not observed responding to internal stimuli.  Diagnosis:  Major Depressive Disorder, by history Opioid Use Disorder, severe Cocaine Use Disorder, severe   Past Medical History:  Past Medical History:  Diagnosis Date  . Bipolar 1 disorder ([place])   . Depression   . Hypertension   . Neuropathy   . Schizophrenia ([place])   . Thyroid disease     Past Surgical History:  Procedure Laterality Date  . ABDOMINAL HYSTERECTOMY    . COLONOSCOPY N/A 11/23/2017   Procedure: COLONOSCOPY;  Surgeon: Lin Landsman, MD;  Location: [facility] ENDOSCOPY;  Service: Gastroenterology;  Laterality: N/A;  . COLONOSCOPY N/A 11/24/2017   Procedure: COLONOSCOPY;  Surgeon: Lin Landsman, MD;  Location: [facility] ENDOSCOPY;  Service: Gastroenterology;  Laterality: N/A;  . KNEE SURGERY Right   . TONSILLECTOMY      Family History: No family history on file.  Social History:  reports that she has never smoked. She has never used smokeless tobacco. She  reports previous drug use. She reports that she does not drink alcohol.  Additional Social History:  Alcohol / Drug Use Pain Medications: See MAR Prescriptions: See MAR Over the Counter: See MAR History of alcohol / drug use?: Yes Longest period of sobriety (when/how long): 6 months Negative Consequences of Use: Financial, Legal, Personal relationships, Work / School Withdrawal Symptoms: Agitation, Nausea / Vomiting, Tremors, Fever / Chills Substance #1 Name of Substance 1: Opiates 1 - Age of First Use: Unable to quantify 1 - Amount (size/oz): Unable to quantify 1 - Frequency: Unable to quantify 1 - Duration: Unable to quantify 1 - Last Use / Amount: "6 percocets" Substance #2 Name of Substance 2: Benzodiazepines 2 - Age of First Use: Unable to quantify 2 - Amount (size/oz): Unable to quantify 2 - Frequency: Unable to quantify 2 - Duration: Unable to quantify 2 - Last Use / Amount: "I havent used that in a few days" Substance #3 Name of Substance 3: Crack Cocaine 3 - Age of First Use: Unable to quantify 3 - Amount (size/oz): Unable to quantify 3 - Frequency: Unable to quantify 3 - Duration: Unable to quantify 3 - Last Use / Amount: "2 days ago"  CIWA: CIWA-Ar BP: (!) 183/109 Pulse Rate: 82 COWS:    Allergies:  Allergies  Allergen Reactions  . Amoxicillin Rash  . Effexor [Venlafaxine] Rash    "whelps"  . Ketorolac Itching  . Lamotrigine Rash  . Sulfa Antibiotics Itching    Home Medications: (Not in a hospital admission)   OB/GYN Status:  No LMP recorded. Patient has had a hysterectomy.  General Assessment  Data Location of Assessment: [facility] ED TTS Assessment: In system Is this a Tele or Face-to-Face Assessment?: Face-to-Face Is this an Initial Assessment or a Re-assessment for this encounter?: Initial Assessment Patient Accompanied by:: N/A Language Other than English: No Living Arrangements: Other (Comment)(Private Residence) What gender do you identify as?:  Female Marital status: Single Pregnancy Status: No Living Arrangements: Alone Can pt return to current living arrangement?: Yes Admission Status: Voluntary Is patient capable of signing voluntary admission?: Yes Referral Source: Self/Family/Friend Insurance type: [place] Medicaid  Medical Screening Exam ([place]) Medical Exam completed: Yes  Crisis Care Plan Living Arrangements: Alone Legal Guardian: Other:(Self) Name of Psychiatrist: None Name of Therapist: None  Education Status Is patient currently in school?: No Is the patient employed, unemployed or receiving disability?: Unemployed  Risk to self with the past 6 months Suicidal Ideation: No Has patient been a risk to self within the past 6 months prior to admission? : No Suicidal Intent: No Has patient had any suicidal intent within the past 6 months prior to admission? : No Is patient at risk for suicide?: Yes Suicidal Plan?: No Has patient had any suicidal plan within the past 6 months prior to admission? : No Access to Means: No What has been your use of drugs/alcohol within the last 12 months?: Opiates, benzo, crack cocaine Previous Attempts/Gestures: No How many times?: 0 Other Self Harm Risks: None Triggers for Past Attempts: None known Intentional Self Injurious Behavior: None Family Suicide History: No Recent stressful life event(s): Other (Comment)(Active addiction; relapse) Persecutory voices/beliefs?: No Depression: Yes Depression Symptoms: Despondent, Insomnia, Tearfulness, Isolating, Fatigue, Guilt, Loss of interest in usual pleasures, Feeling worthless/self pity Substance abuse history and/or treatment for substance abuse?: Yes Suicide prevention information given to non-admitted patients: Not applicable  Risk to Others within the past 6 months Homicidal Ideation: No Does patient have any lifetime risk of violence toward others beyond the six months prior to admission? : No Thoughts of Harm to  Others: No Current Homicidal Intent: No Current Homicidal Plan: No Access to Homicidal Means: No Identified Victim: None History of harm to others?: No Assessment of Violence: None Noted Violent Behavior Description: None Does patient have access to weapons?: No Criminal Charges Pending?: No Does patient have a court date: No Is patient on probation?: No  Psychosis Hallucinations: None noted Delusions: None noted  Mental Status Report Appearance/Hygiene: In scrubs Eye Contact: Good Motor Activity: Freedom of movement Speech: Logical/coherent Level of Consciousness: Alert Mood: Depressed, Guilty Affect: Depressed Anxiety Level: Minimal Thought Processes: Coherent, Relevant Judgement: Unimpaired Orientation: Person, Place, Time, Situation, Appropriate for developmental age Obsessive Compulsive Thoughts/Behaviors: None  Cognitive Functioning Concentration: Normal Memory: Recent Intact, Remote Intact Is patient IDD: No Insight: Fair Impulse Control: Fair Appetite: Good Have you had any weight changes? : Loss Amount of the weight change? (lbs): 50 lbs Sleep: Decreased Total Hours of Sleep: 4 Vegetative Symptoms: None  ADLScreening [facility] Assessment Services) Patient's cognitive ability adequate to safely complete daily activities?: Yes Patient able to express need for assistance with ADLs?: Yes Independently performs ADLs?: Yes (appropriate for developmental age)  Prior Inpatient Therapy Prior Inpatient Therapy: Yes Prior Therapy Dates: July 2019 Prior Therapy Facilty/Provider(s): [facility] Reason for Treatment: Depression  Prior Outpatient Therapy Prior Outpatient Therapy: Yes Prior Therapy Dates: 2020 Prior Therapy Facilty/Provider(s): [facility]; [employer] Reason for Treatment: Depression; MAT Does patient have an ACCT team?: No Does patient have Intensive In-House Services?  : No Does patient have [facility] services? : No  Does patient have P4CC services?:  No  ADL Screening (condition at time of admission) Patient's cognitive ability adequate to safely complete daily activities?: Yes Patient able to express need for assistance with ADLs?: Yes Independently performs ADLs?: Yes (appropriate for developmental age)       Abuse/Neglect Assessment (Assessment to be complete while patient is alone) Abuse/Neglect Assessment Can Be Completed: Yes Physical Abuse: Denies Verbal Abuse: Denies Sexual Abuse: Denies Exploitation of patient/patient's resources: Denies Self-Neglect: Denies Values / Beliefs Cultural Requests During Hospitalization: None Spiritual Requests During Hospitalization: None Consults Spiritual Care Consult Needed: No Social Work Consult Needed: No         Child/Adolescent Assessment Running Away Risk: (Patient is an adult)  Disposition:  Disposition Initial Assessment Completed for this Encounter: Yes Disposition of Patient: Admit Type of inpatient treatment program: Adult Patient refused recommended treatment: No Mode of transportation if patient is discharged/movement?: N/A Patient referred to: Other (Comment)([facility] BMU)  On Site Evaluation by:   Reviewed with Physician:    Frederich Cha 12/18/2018 6:57 PM

## 2018-12-18 NOTE — ED Notes (Signed)
TTS at bedisde

## 2018-12-18 NOTE — ED Notes (Signed)
Report called to Alex RN at [facility] for pt to transfer.

## 2018-12-18 NOTE — Tx Team (Signed)
Initial Treatment Plan 12/18/2018 11:14 PM Teresa Jefferson [id number]    PATIENT STRESSORS: Health problems Medication change or noncompliance Substance abuse   PATIENT STRENGTHS: General fund of knowledge Motivation for treatment/growth   PATIENT IDENTIFIED PROBLEMS: Substance ABUSE     Depression                  DISCHARGE CRITERIA:  Medical problems require only outpatient monitoring Motivation to continue treatment in a less acute level of care  PRELIMINARY DISCHARGE PLAN: Outpatient therapy Participate in family therapy  PATIENT/FAMILY INVOLVEMENT: This treatment plan has been presented to and reviewed with the patient, Teresa Jefferson, The patient and family have been given the opportunity to ask questions and make suggestions.  Clemens Catholic, RN 12/18/2018, 11:14 PM

## 2018-12-18 NOTE — ED Triage Notes (Signed)
PT arrives voluntarily with concerns over depression and suicidal thoughts with no act or plan. Pt states she also wants help "getting off of drugs." Pt states she last used cocaine 2 days ago and used opioids today.

## 2018-12-18 NOTE — Tx Team (Signed)
Initial Treatment Plan 12/18/2018 11:32 PM Teresa Jefferson [id number]    PATIENT STRESSORS: Financial difficulties Health problems Medication change or noncompliance Substance abuse   PATIENT STRENGTHS: Ability for insight Average or above average intelligence Capable of independent living Motivation for treatment/growth   PATIENT IDENTIFIED PROBLEMS: Cocaine / Opioid addiction     Depression/Anxiety    Body image distubance             DISCHARGE CRITERIA:  Adequate post-discharge living arrangements Medical problems require only outpatient monitoring Need for constant or close observation no longer present Verbal commitment to aftercare and medication compliance  PRELIMINARY DISCHARGE PLAN: Attend PHP/IOP Attend 12-step recovery group Outpatient therapy Return to previous living arrangement  PATIENT/FAMILY INVOLVEMENT: This treatment plan has been presented to and reviewed with the patient, Teresa Jefferson,  The patient  have been given the opportunity to ask questions and make suggestions.  Clemens Catholic, RN 12/18/2018, 11:32 PM

## 2018-12-18 NOTE — Progress Notes (Signed)
Patient is 53 years old female a new admit who came to ED  under voluntary commitment, for a cocaine and opioid relapse , also  Teresa Jefferson that my reason to commit my self is to get Suboxone to help tx. Opioid and cocaine  dependency , and I am having cough and stuffy nose, patient has been COVID 19 tested and is negative, patient also expressed depression and anxiety 6/10 scale, unit guide lines and expected behaviors are explained , body search and skin check is done by two staff members and contraband found and skin is clean. Unit orientation  is complete , beverages and fluids are provided , hygiene products are provided , patient denies any suicidal or homicidal  Ideations , no signs of delusions or hallucinations, patient is compliant and coherent and has been placed in room 304 and Dr. Weber Cooks will be attending.

## 2018-12-18 NOTE — ED Notes (Signed)
Report received from [place]. Pt is pleasant, calm, and cooperative on approach. Pt endorses continuing headache (7/10) but denies other concerns, including SI/HI/AVH. Ginger ale given. Will continue to monitor for needs/safety.

## 2018-12-18 NOTE — ED Notes (Signed)
Pt wants to take her night meds immediately before transfer to BMU. Have attempted twice to reach call unit. Will reattempt.

## 2018-12-19 DIAGNOSIS — F332 Major depressive disorder, recurrent severe without psychotic features: Secondary | ICD-10-CM

## 2018-12-19 LAB — LIPID PANEL
Cholesterol: 190 mg/dL (ref 0–200)
HDL: 60 mg/dL (ref >40)
LDL Cholesterol: 102 mg/dL — ABNORMAL HIGH (ref 0–99)
Total CHOL/HDL Ratio: 3.2 RATIO
Triglycerides: 141 mg/dL (ref <150)
VLDL: 28 mg/dL (ref 0–40)

## 2018-12-19 LAB — HEMOGLOBIN A1C
Hgb A1c MFr Bld: 6 % — ABNORMAL HIGH (ref 4.8–5.6)
Mean Plasma Glucose: 125.5 mg/dL

## 2018-12-19 LAB — TSH: TSH: 12.991 u[IU]/mL — ABNORMAL HIGH (ref 0.350–4.500)

## 2018-12-19 MED ORDER — IBUPROFEN 600 MG PO TABS
600.0000 mg | ORAL_TABLET | Freq: Four times a day (QID) | ORAL | Status: DC | PRN
  Administered 2018-12-19: 600 mg via ORAL
  Filled 2018-12-19: qty 1

## 2018-12-19 MED ORDER — LORAZEPAM 2 MG PO TABS: 2.0000 mg | ORAL_TABLET | Freq: Once | ORAL | Status: DC

## 2018-12-19 MED ORDER — CHLORDIAZEPOXIDE HCL 25 MG PO CAPS
50.0000 mg | ORAL_CAPSULE | ORAL | Status: AC
  Administered 2018-12-19: 50 mg via ORAL
  Filled 2018-12-19: qty 2

## 2018-12-19 MED ORDER — LORAZEPAM 2 MG/ML IJ SOLN: 2.0000 mg | Freq: Once | INTRAMUSCULAR | Status: DC

## 2018-12-19 MED ORDER — LISINOPRIL 20 MG PO TABS
10.0000 mg | ORAL_TABLET | Freq: Every day | ORAL | Status: DC
  Administered 2018-12-19 – 2018-12-21 (×3): 10 mg via ORAL
  Filled 2018-12-19 (×3): qty 1

## 2018-12-19 MED ORDER — QUETIAPINE FUMARATE 200 MG PO TABS
400.0000 mg | ORAL_TABLET | Freq: Every day | ORAL | Status: DC
  Administered 2018-12-19 – 2018-12-20 (×2): 400 mg via ORAL
  Filled 2018-12-19 (×2): qty 2

## 2018-12-19 MED ORDER — BUPRENORPHINE HCL-NALOXONE HCL 2-0.5 MG SL SUBL
2.0000 | SUBLINGUAL_TABLET | Freq: Every day | SUBLINGUAL | Status: DC
  Administered 2018-12-19: 10:00:00 2 via SUBLINGUAL
  Filled 2018-12-19: qty 2

## 2018-12-19 MED ORDER — GABAPENTIN 300 MG PO CAPS
600.0000 mg | ORAL_CAPSULE | Freq: Three times a day (TID) | ORAL | Status: DC
  Administered 2018-12-19 – 2018-12-21 (×7): 600 mg via ORAL
  Filled 2018-12-19 (×7): qty 2

## 2018-12-19 MED ORDER — IBUPROFEN 600 MG PO TABS
800.0000 mg | ORAL_TABLET | Freq: Three times a day (TID) | ORAL | Status: DC | PRN
  Administered 2018-12-19 – 2018-12-21 (×4): 800 mg via ORAL
  Filled 2018-12-19 (×6): qty 1

## 2018-12-19 MED ORDER — BUPRENORPHINE HCL-NALOXONE HCL 8-2 MG SL SUBL
1.0000 | SUBLINGUAL_TABLET | Freq: Two times a day (BID) | SUBLINGUAL | Status: DC
  Administered 2018-12-19 – 2018-12-21 (×4): 1 via SUBLINGUAL
  Filled 2018-12-19 (×4): qty 1

## 2018-12-19 MED ORDER — ZIPRASIDONE MESYLATE 20 MG IM SOLR: 20.0000 mg | Freq: Once | INTRAMUSCULAR | Status: DC

## 2018-12-19 MED ORDER — GUAIFENESIN-DM 100-10 MG/5ML PO SYRP
5.0000 mL | ORAL_SOLUTION | Freq: Four times a day (QID) | ORAL | Status: DC | PRN
  Administered 2018-12-19 – 2018-12-20 (×3): 5 mL via ORAL
  Filled 2018-12-19 (×5): qty 5

## 2018-12-19 NOTE — Tx Team (Addendum)
Interdisciplinary Treatment and Diagnostic Plan Update  12/19/2018 Time of Session: [place] MRN: [id number]  Principal Diagnosis: <principal problem not specified>  Secondary Diagnoses: Active Problems:   MDD (major depressive disorder), recurrent episode, severe (HCC)   Current Medications:  Current Facility-Administered Medications  Medication Dose Route Frequency Provider Last Rate Last Dose  . acetaminophen (TYLENOL) tablet 650 mg  650 mg Oral Q6H PRN Suella Broad, FNP      . alum & mag hydroxide-simeth (MAALOX/MYLANTA) 200-200-20 MG/5ML suspension 30 mL  30 mL Oral Q4H PRN Suella Broad, FNP      . buprenorphine-naloxone (SUBOXONE) 2-0.5 mg per SL tablet 2 tablet  2 tablet Sublingual Daily [name], [name], MD   2 tablet at 12/19/18 [phone]  . gabapentin (NEURONTIN) capsule 600 mg  600 mg Oral TID [name], [name], MD   600 mg at 12/19/18 1035  . hydrOXYzine (ATARAX/VISTARIL) tablet 50 mg  50 mg Oral TID PRN Caroline Sauger, NP   50 mg at 12/18/18 2328  . ibuprofen (ADVIL) tablet 800 mg  800 mg Oral Q8H PRN [name], [name], MD      . lisinopril (ZESTRIL) tablet 10 mg  10 mg Oral Daily Caroline Sauger, NP   10 mg at 12/19/18 3810  . magnesium hydroxide (MILK OF MAGNESIA) suspension 30 mL  30 mL Oral Daily PRN Suella Broad, FNP      . QUEtiapine (SEROQUEL) tablet 400 mg  400 mg Oral QHS [name], [name], MD      . traZODone (DESYREL) tablet 100 mg  100 mg Oral QHS PRN Suella Broad, FNP       PTA Medications: Medications Prior to Admission  Medication Sig Dispense Refill Last Dose  . buprenorphine-naloxone (SUBOXONE) 8-2 mg SUBL SL tablet Place 1 tablet under the tongue 2 (two) times daily.     Marland Kitchen gabapentin (NEURONTIN) 600 MG tablet Take 600 mg by mouth 3 (three) times daily.      Marland Kitchen ibuprofen (ADVIL) 800 MG tablet Take 800 mg by mouth every 8 (eight) hours as needed.     Marland Kitchen lisinopril (ZESTRIL) 10 MG tablet Take 10 mg by mouth  daily.      . QUEtiapine (SEROQUEL) 400 MG tablet Take 1 tablet (400 mg total) by mouth at bedtime. 30 tablet 0     Patient Stressors: Financial difficulties Health problems Medication change or noncompliance Substance abuse  Patient Strengths: Ability for insight Average or above average intelligence Capable of independent living Motivation for treatment/growth  Treatment Modalities: Medication Management, Group therapy, Case management,  1 to 1 session with clinician, Psychoeducation, Recreational therapy.   Physician Treatment Plan for Primary Diagnosis: <principal problem not specified> Long Term Goal(s):     Short Term Goals:    Medication Management: Evaluate patient's response, side effects, and tolerance of medication regimen.  Therapeutic Interventions: 1 to 1 sessions, Unit Group sessions and Medication administration.  Evaluation of Outcomes: Progressing  Physician Treatment Plan for Secondary Diagnosis: Active Problems:   MDD (major depressive disorder), recurrent episode, severe ([place])  Long Term Goal(s):     Short Term Goals:       Medication Management: Evaluate patient's response, side effects, and tolerance of medication regimen.  Therapeutic Interventions: 1 to 1 sessions, Unit Group sessions and Medication administration.  Evaluation of Outcomes: Progressing   RN Treatment Plan for Primary Diagnosis: <principal problem not specified> Long Term Goal(s): Knowledge of disease and therapeutic regimen to maintain health will  improve  Short Term Goals: Ability to demonstrate self-control, Ability to participate in decision making will improve, Ability to verbalize feelings will improve and Ability to identify and develop effective coping behaviors will improve  Medication Management: RN will administer medications as ordered by provider, will assess and evaluate patient's response and provide education to patient for prescribed medication. RN will report  any adverse and/or side effects to prescribing provider.  Therapeutic Interventions: 1 on 1 counseling sessions, Psychoeducation, Medication administration, Evaluate responses to treatment, Monitor vital signs and CBGs as ordered, Perform/monitor CIWA, COWS, AIMS and Fall Risk screenings as ordered, Perform wound care treatments as ordered.  Evaluation of Outcomes: Progressing   LCSW Treatment Plan for Primary Diagnosis: <principal problem not specified> Long Term Goal(s): Safe transition to appropriate next level of care at discharge, Engage patient in therapeutic group addressing interpersonal concerns.  Short Term Goals: Engage patient in aftercare planning with referrals and resources, Facilitate patient progression through stages of change regarding substance use diagnoses and concerns, Identify triggers associated with mental health/substance abuse issues and Increase skills for wellness and recovery  Therapeutic Interventions: Assess for all discharge needs, 1 to 1 time with Social worker, Explore available resources and support systems, Assess for adequacy in community support network, Educate family and significant other(s) on suicide prevention, Complete Psychosocial Assessment, Interpersonal group therapy.  Evaluation of Outcomes: Progressing   Progress in Treatment: Attending groups: Yes. Participating in groups: Yes. Taking medication as prescribed: Yes. Toleration medication: Yes. Family/Significant other contact made: No, will contact:  pt declined collateral contact Patient understands diagnosis: Yes. Discussing patient identified problems/goals with staff: Yes. Medical problems stabilized or resolved: Yes. Denies suicidal/homicidal ideation: Yes. Issues/concerns per patient self-inventory: No. Other: N/A  New problem(s) identified: No, Describe:  none  New Short Term/Long Term Goal(s): Detox, elimination of AVH/symptoms of psychosis, medication management for mood  stabilization; elimination of SI thoughts; development of comprehensive mental wellness/sobriety plan.   Patient Goals:  "Getting back on my medications and getting stable"  Discharge Plan or Barriers: SPE pamphlet, Mobile Crisis information, and AA/NA information provided to patient for additional community support and resources. Pt has completed referral form for [facility] and [facility]. CSW will review the referrals and fax them over once H&P is completed.  Reason for Continuation of Hospitalization: Depression Medication stabilization Withdrawal symptoms  Estimated Length of Stay: 5-7 days    Recreational Therapy: Patient Stressors: N/A   Patient Goal: Patient will identify 3 positive coping skills strategies to use post d/c within 5 recreation therapy group  sessions  Attendees: Patient: Teresa Jefferson 12/19/2018 3:09 PM  Physician: Dr Weber Cooks MD 12/19/2018 3:09 PM  Nursing: Lyda Kalata RN 12/19/2018 3:09 PM  RN Care Manager: 12/19/2018 3:09 PM  Social Worker: Minette Brine Moton LCSW 12/19/2018 3:09 PM  Recreational Therapist: Roanna Epley CTRS LRT 12/19/2018 3:09 PM  Other: Assunta Curtis LCSW 12/19/2018 3:09 PM  Other:  12/19/2018 3:09 PM  Other: 12/19/2018 3:09 PM    Scribe for Treatment Team: Mariann Laster Moton, LCSW 12/19/2018 3:09 PM

## 2018-12-19 NOTE — B[facility] Group Notes (Signed)
LCSW Group Therapy Note  12/19/2018 11:33 AM  Type of Therapy/Topic:  Group Therapy:  Emotion Regulation  Participation Level:  Active   Description of Group:   The purpose of this group is to assist patients in learning to regulate negative emotions and experience positive emotions. Patients will be guided to discuss ways in which they have been vulnerable to their negative emotions. These vulnerabilities will be juxtaposed with experiences of positive emotions or situations, and patients will be challenged to use positive emotions to combat negative ones. Special emphasis will be placed on coping with negative emotions in conflict situations, and patients will process healthy conflict resolution skills.  Therapeutic Goals: 1. Patient will identify two positive emotions or experiences to reflect on in order to balance out negative emotions 2. Patient will label two or more emotions that they find the most difficult to experience 3. Patient will demonstrate positive conflict resolution skills through discussion and/or role plays  Summary of Patient Progress: Pt was appropriate and respectful in group. Pt was able to identify what emotion regulation means to her. Pt reported that its expressing you feelings. Pt stated that she was feeling a little suicidal and depressed prior to coming to the hospital, but reported that since getting her medication this morning she has been feeling a lot better. Pt identified medication as a coping strategy that keeps her in a good mood because without it she is very depressed.   Therapeutic Modalities:   Cognitive Behavioral Therapy Feelings Identification Dialectical Behavioral Therapy   Teresa Jefferson, MSW, LCSW Clinical Social Work 12/19/2018 11:33 AM

## 2018-12-19 NOTE — Progress Notes (Signed)
Patient's blood pressure was extremely high NP notified again order was given for  Lisinopril 10 mg patient in addition requested ibuprofen 800 mg she stated " l  Can't take tylenol" MD notified awaiting response, will continue to monitor patient closely.

## 2018-12-19 NOTE — H&P (Signed)
Psychiatric Admission Assessment Adult  Patient Identification: Teresa Jefferson MRN:  [id number] Date of Evaluation:  12/19/2018 Chief Complaint:  Depression Principal Diagnosis: MDD (major depressive disorder), recurrent episode, severe ([place]) Diagnosis:  Principal Problem:   MDD (major depressive disorder), recurrent episode, severe ([place]) Active Problems:   Opiate abuse, continuous ([place])   Cocaine abuse ([place])   Essential hypertension  History of Present Illness: Patient seen chart reviewed.  This is a patient with a history of recurrent depression and chronic substance abuse who came to the emergency room complaining of passive suicidal ideation and depression.  Patient tells me that she relapsed into drug abuse about 6 months ago possibly longer.  She has been using narcotics daily ever since then.  Does not use intravenously.  Estimates daily usage approximately 80 mg of oxycodone.  Also using benzodiazepines frequently not necessarily every day probably 2 to 4 mg of Xanax equivalent.  Also using crack cocaine intermittently.  Has been off of her antipsychotic medications and off of the Suboxone that she had been using in the past.  Mood depressed and anxious.  Suicidal thoughts are passive with no intent or plan.  No psychotic symptoms no homicidal ideation. Associated Signs/Symptoms: Depression Symptoms:  depressed mood, feelings of worthlessness/guilt, difficulty concentrating, hopelessness, suicidal thoughts without plan, (Hypo) Manic Symptoms:  Distractibility, Anxiety Symptoms:  Excessive Worry, Psychotic Symptoms:  None reported PTSD Symptoms: Negative Total Time spent with patient: 1 hour  Past Psychiatric History: Patient has a history of depression and has had previous hospitalizations most recently last year.  Has had diagnoses of schizophrenia in the past although reading the old chart it seems that this diagnosis is unlikely.  More likely has recurrent depression in the  context of substance abuse which has been the primary issue.  She was following up with outpatient treatment for Suboxone maintenance but dropped out of therapy late last year  Is the patient at risk to self? Yes.    Has the patient been a risk to self in the past 6 months? Yes.    Has the patient been a risk to self within the distant past? Yes.    Is the patient a risk to others? No.  Has the patient been a risk to others in the past 6 months? No.  Has the patient been a risk to others within the distant past? No.   Prior Inpatient Therapy:   Prior Outpatient Therapy:    Alcohol Screening: 1. How often do you have a drink containing alcohol?: 2 to 4 times a month 2. How many drinks containing alcohol do you have on a typical day when you are drinking?: 5 or 6 3. How often do you have six or more drinks on one occasion?: Monthly AUDIT-C Score: 6 4. How often during the last year have you found that you were not able to stop drinking once you had started?: Never 5. How often during the last year have you failed to do what was normally expected from you becasue of drinking?: Never 6. How often during the last year have you needed a first drink in the morning to get yourself going after a heavy drinking session?: Never 7. How often during the last year have you had a feeling of guilt of remorse after drinking?: Never 8. How often during the last year have you been unable to remember what happened the night before because you had been drinking?: Never 9. Have you or someone else been injured as a  result of your drinking?: No 10. Has a relative or friend or a doctor or another health worker been concerned about your drinking or suggested you cut down?: No Alcohol Use Disorder Identification Test Final Score (AUDIT): 6 Alcohol Brief Interventions/Follow-up: AUDIT Score <7 follow-up not indicated, Alcohol Education Substance Abuse History in the last 12 months:  Yes.   Consequences of Substance  Abuse: Medical Consequences:  Noncompliance with medical treatment.  Poor self-care. Previous Psychotropic Medications: Yes  Psychological Evaluations: Yes  Past Medical History:  Past Medical History:  Diagnosis Date  . Bipolar 1 disorder ([place])   . Depression   . Hypertension   . Neuropathy   . Schizophrenia ([place])   . Thyroid disease     Past Surgical History:  Procedure Laterality Date  . ABDOMINAL HYSTERECTOMY    . COLONOSCOPY N/A 11/23/2017   Procedure: COLONOSCOPY;  Surgeon: Lin Landsman, MD;  Location: [facility] ENDOSCOPY;  Service: Gastroenterology;  Laterality: N/A;  . COLONOSCOPY N/A 11/24/2017   Procedure: COLONOSCOPY;  Surgeon: Lin Landsman, MD;  Location: [facility] ENDOSCOPY;  Service: Gastroenterology;  Laterality: N/A;  . KNEE SURGERY Right   . TONSILLECTOMY     Family History: History reviewed. No pertinent family history. Family Psychiatric  History: Positive for substance abuse Tobacco Screening: Have you used any form of tobacco in the last 30 days? (Cigarettes, Smokeless Tobacco, Cigars, and/or Pipes): Yes Tobacco use, Select all that apply: 5 or more cigarettes per day Are you interested in Tobacco Cessation Medications?: Yes, will notify MD for an order Social History:  Social History   Substance and Sexual Activity  Alcohol Use No     Social History   Substance and Sexual Activity  Drug Use Not Currently   Comment: crack and opiates    Additional Social History:                           Allergies:   Allergies  Allergen Reactions  . Amoxicillin Rash  . Effexor [Venlafaxine] Rash    "whelps"  . Ketorolac Itching  . Lamotrigine Rash  . Sulfa Antibiotics Itching   Lab Results:  Results for orders placed or performed during the hospital encounter of 12/18/18 (from the past 48 hour(s))  Hemoglobin A1c     Status: Abnormal   Collection Time: 12/19/18  7:11 AM  Result Value Ref Range   Hgb A1c MFr Bld 6.0 (H) 4.8 - 5.6 %     Comment: (NOTE) Pre diabetes:          5.7%-6.4% Diabetes:              >6.4% Glycemic control for   <7.0% adults with diabetes    Mean Plasma Glucose 125.5 mg/dL    Comment: Performed at [facility], [place] [address], [place], [place] [address]  Lipid panel     Status: Abnormal   Collection Time: 12/19/18  7:11 AM  Result Value Ref Range   Cholesterol 190 0 - 200 mg/dL   Triglycerides 141 <150 mg/dL   HDL 60 >40 mg/dL   Total CHOL/HDL Ratio 3.2 RATIO   VLDL 28 0 - 40 mg/dL   LDL Cholesterol 102 (H) 0 - 99 mg/dL    Comment:        Total Cholesterol/HDL:CHD Risk Coronary Heart Disease Risk Table                     Men  Women  1/2 Average Risk   3.4   3.3  Average Risk       5.0   4.4  2 X Average Risk   9.6   7.1  3 X Average Risk  23.4   11.0        Use the calculated Patient Ratio above and the CHD Risk Table to determine the patient's CHD Risk.        ATP III CLASSIFICATION (LDL):  <100     mg/dL   Optimal  100-129  mg/dL   Near or Above                    Optimal  130-159  mg/dL   Borderline  160-189  mg/dL   High  >190     mg/dL   Very High Performed at [facility], [address] [place], [place] [address]   TSH     Status: Abnormal   Collection Time: 12/19/18  7:11 AM  Result Value Ref Range   TSH 12.991 (H) 0.350 - 4.500 uIU/mL    Comment: Performed by a 3rd Generation assay with a functional sensitivity of <=0.01 uIU/mL. Performed at [facility], [address] [place], [place] [address]     Blood Alcohol level:  Lab Results  Component Value Date   [facility] <10 12/18/2018   ETH <10 14/23/9532    Metabolic Disorder Labs:  Lab Results  Component Value Date   HGBA1C 6.0 (H) 12/19/2018   MPG 125.5 12/19/2018   MPG 128.37 11/16/2017   No results found for: PROLACTIN Lab Results  Component Value Date   CHOL 190 12/19/2018   TRIG 141 12/19/2018   HDL 60 12/19/2018   CHOLHDL 3.2 12/19/2018   VLDL 28 12/19/2018   LDLCALC  102 (H) 12/19/2018   LDLCALC 81 11/16/2017    Current Medications: Current Facility-Administered Medications  Medication Dose Route Frequency Provider Last Rate Last Dose  . acetaminophen (TYLENOL) tablet 650 mg  650 mg Oral Q6H PRN Suella Broad, FNP      . alum & mag hydroxide-simeth (MAALOX/MYLANTA) 200-200-20 MG/5ML suspension 30 mL  30 mL Oral Q4H PRN Starkes-Perry, Gayland Curry, FNP      . buprenorphine-naloxone (SUBOXONE) 8-2 mg per SL tablet 1 tablet  1 tablet Sublingual BID [name], [name], MD      . gabapentin (NEURONTIN) capsule 600 mg  600 mg Oral TID [name], [name], MD   600 mg at 12/19/18 1035  . guaiFENesin-dextromethorphan (ROBITUSSIN DM) 100-10 MG/5ML syrup 5 mL  5 mL Oral Q6H PRN [name], [name], MD      . hydrOXYzine (ATARAX/VISTARIL) tablet 50 mg  50 mg Oral TID PRN Caroline Sauger, NP   50 mg at 12/18/18 2328  . ibuprofen (ADVIL) tablet 800 mg  800 mg Oral Q8H PRN [name], [name], MD      . lisinopril (ZESTRIL) tablet 10 mg  10 mg Oral Daily Caroline Sauger, NP   10 mg at 12/19/18 0233  . magnesium hydroxide (MILK OF MAGNESIA) suspension 30 mL  30 mL Oral Daily PRN Suella Broad, FNP      . QUEtiapine (SEROQUEL) tablet 400 mg  400 mg Oral QHS [name], [name], MD      . traZODone (DESYREL) tablet 100 mg  100 mg Oral QHS PRN Suella Broad, FNP       PTA Medications: Medications Prior to Admission  Medication Sig Dispense Refill Last  Dose  . buprenorphine-naloxone (SUBOXONE) 8-2 mg SUBL SL tablet Place 1 tablet under the tongue 2 (two) times daily.     Mar[place] Kitchen gabapentin (NEURONTIN) 600 MG tablet Take 600 mg by mouth 3 (three) times daily.      Mar[place] Kitchen ibuprofen (ADVIL) 800 MG tablet Take 800 mg by mouth every 8 (eight) hours as needed.     Mar[place] Kitchen lisinopril (ZESTRIL) 10 MG tablet Take 10 mg by mouth daily.      . QUEtiapine (SEROQUEL) 400 MG tablet Take 1 tablet (400 mg total) by mouth at bedtime. 30 tablet 0     Musculoskeletal: Strength & Muscle  Tone: within normal limits Gait & Station: normal Patient leans: N/A  Psychiatric Specialty Exam: Physical Exam  Nursing note and vitals reviewed. Constitutional: She appears well-developed and well-nourished.  HENT:  Head: Normocephalic and atraumatic.  Eyes: Pupils are equal, round, and reactive to light. Conjunctivae are normal.  Neck: Normal range of motion.  Cardiovascular: Regular rhythm and normal heart sounds.  Respiratory: Effort normal. No respiratory distress.  GI: Soft.  Musculoskeletal: Normal range of motion.  Neurological: She is alert.  Skin: Skin is warm and dry.  Psychiatric: Her speech is normal and behavior is normal. Judgment and thought content normal. Her mood appears anxious. Cognition and memory are normal.    Review of Systems  Constitutional: Negative.   HENT: Negative.   Eyes: Negative.   Respiratory: Negative.   Cardiovascular: Negative.   Gastrointestinal: Negative.   Musculoskeletal: Negative.   Skin: Negative.   Neurological: Negative.   Psychiatric/Behavioral: Positive for depression and substance abuse. Negative for suicidal ideas.    Blood pressure (!) 159/99, pulse 75, temperature 98.8 F (37.1 C), temperature source Oral, resp. rate (!) 24, height 5' 2"  (1.575 m), SpO2 97 %.Body mass index is 27.44 kg/m.  General Appearance: Casual  Eye Contact:  Good  Speech:  Clear and Coherent  Volume:  Normal  Mood:  Depressed  Affect:  Congruent  Thought Process:  Goal Directed  Orientation:  Full (Time, Place, and Person)  Thought Content:  Logical  Suicidal Thoughts:  No  Homicidal Thoughts:  No  Memory:  Immediate;   Fair Recent;   Fair Remote;   Fair  Judgement:  Fair  Insight:  Fair  Psychomotor Activity:  Decreased  Concentration:  Concentration: Fair  Recall:  [employer] of Knowledge:  Fair  Language:  Fair  Akathisia:  No  Handed:  Right  AIMS (if indicated):     Assets:  Desire for Improvement Resilience  ADL's:  Intact   Cognition:  WNL  Sleep:  Number of Hours: 6.5    Treatment Plan Summary: Daily contact with patient to assess and evaluate symptoms and progress in treatment, Medication management and Plan Patient having symptoms of opiate withdrawal.  She will be treated with Suboxone for 3 days for detox in the hospital.  Also will be restarted on her Seroquel.  Patient takes gabapentin chronically which will be restarted.  Also restart lisinopril for blood pressure.  She is requesting cough syrup claiming she has a frequent cough although so far no one has been hearing her cough.  We will have a modest dose cough syrup available.  Patient has met with social work and is focused on trying to get into [facility].  We are working on disposition to inpatient substance abuse treatment  Observation Level/Precautions:  15 minute checks  Laboratory:  Chemistry Profile  Psychotherapy:    Medications:  Consultations:    Discharge Concerns:    Estimated LOS:  Other:     Physician Treatment Plan for Primary Diagnosis: MDD (major depressive disorder), recurrent episode, severe ([place]) Long Term Goal(s): Improvement in symptoms so as ready for discharge  Short Term Goals: Ability to verbalize feelings will improve and Ability to disclose and discuss suicidal ideas  Physician Treatment Plan for Secondary Diagnosis: Principal Problem:   MDD (major depressive disorder), recurrent episode, severe ([place]) Active Problems:   Opiate abuse, continuous ([place])   Cocaine abuse ([place])   Essential hypertension  Long Term Goal(s): Improvement in symptoms so as ready for discharge  Short Term Goals: Compliance with prescribed medications will improve  I certify that inpatient services furnished can reasonably be expected to improve the patient's condition.    Alethia Berthold, MD 8/5/20203:35 PM

## 2018-12-19 NOTE — BHH Suicide Risk Assessment (Signed)
[facility] Admission Suicide Risk Assessment   Nursing information obtained from:  Patient Demographic factors:  Adolescent or young adult, Caucasian, Low socioeconomic status, Unemployed Current Mental Status:  NA Loss Factors:  Decline in physical health, Financial problems / change in socioeconomic status Historical Factors:  Impulsivity Risk Reduction Factors:  Positive social support, Positive therapeutic relationship  Total Time spent with patient: 1 hour Principal Problem: MDD (major depressive disorder), recurrent episode, severe ([place]) Diagnosis:  Principal Problem:   MDD (major depressive disorder), recurrent episode, severe ([place]) Active Problems:   Opiate abuse, continuous ([place])   Cocaine abuse ([place])   Essential hypertension  Subjective Data: Patient seen and chart reviewed.  Patient with a history of depression and substance abuse came to the emergency room with depressed mood and passive suicidal ideation.  Currently denies suicidal intent or plan.  Not psychotic.  Cooperative with treatment.  Focused primarily on substance abuse treatment  Continued Clinical Symptoms:  Alcohol Use Disorder Identification Test Final Score (AUDIT): 6 The "Alcohol Use Disorders Identification Test", Guidelines for Use in Primary Care, Second Edition.  World Pharmacologist [facility]). Score between 0-7:  no or low risk or alcohol related problems. Score between 8-15:  moderate risk of alcohol related problems. Score between 16-19:  high risk of alcohol related problems. Score 20 or above:  warrants further diagnostic evaluation for alcohol dependence and treatment.   CLINICAL FACTORS:   Depression:   Comorbid alcohol abuse/dependence Alcohol/Substance Abuse/Dependencies   Musculoskeletal: Strength & Muscle Tone: within normal limits Gait & Station: normal Patient leans: N/A  Psychiatric Specialty Exam: Physical Exam  Nursing note and vitals reviewed. Constitutional: She appears  well-developed and well-nourished.  HENT:  Head: Normocephalic and atraumatic.  Eyes: Pupils are equal, round, and reactive to light. Conjunctivae are normal.  Neck: Normal range of motion.  Cardiovascular: Regular rhythm and normal heart sounds.  Respiratory: Effort normal.  GI: Soft.  Musculoskeletal: Normal range of motion.  Neurological: She is alert.  Skin: Skin is warm and dry.  Psychiatric: Her speech is normal and behavior is normal. Judgment and thought content normal. Her mood appears anxious. Cognition and memory are normal.    Review of Systems  Constitutional: Negative.   HENT: Negative.   Eyes: Negative.   Respiratory: Negative.   Cardiovascular: Negative.   Gastrointestinal: Negative.   Musculoskeletal: Negative.   Skin: Negative.   Neurological: Negative.   Psychiatric/Behavioral: Positive for depression and substance abuse. Negative for hallucinations and suicidal ideas. The patient is not nervous/anxious and does not have insomnia.     Blood pressure (!) 159/99, pulse 75, temperature 98.8 F (37.1 C), temperature source Oral, resp. rate (!) 24, height 5\' 2"  (1.575 m), SpO2 97 %.Body mass index is 27.44 kg/m.  General Appearance: Fairly Groomed  Eye Contact:  Fair  Speech:  Clear and Coherent  Volume:  Normal  Mood:  Euthymic  Affect:  Congruent  Thought Process:  Goal Directed  Orientation:  Full (Time, Place, and Person)  Thought Content:  Logical  Suicidal Thoughts:  No  Homicidal Thoughts:  No  Memory:  Immediate;   Fair Recent;   Fair Remote;   Fair  Judgement:  Fair  Insight:  Fair  Psychomotor Activity:  Normal  Concentration:  Concentration: Fair  Recall:  [employer] of Knowledge:  Fair  Language:  Fair  Akathisia:  No  Handed:  Right  AIMS (if indicated):     Assets:  Desire for Improvement  ADL's:  Intact  Cognition:  WNL  Sleep:  Number of Hours: 6.5      COGNITIVE FEATURES THAT CONTRIBUTE TO RISK:  None    SUICIDE RISK:    Minimal: No identifiable suicidal ideation.  Patients presenting with no risk factors but with morbid ruminations; may be classified as minimal risk based on the severity of the depressive symptoms  PLAN OF CARE: Patient currently not voicing suicidal ideation.  Minimal risk.  Continue 15-minute checks.  Continue appropriate medications for depression and detox.  Plan for attempts to refer to inpatient substance abuse treatment  I certify that inpatient services furnished can reasonably be expected to improve the patient's condition.   Alethia Berthold, MD 12/19/2018, 3:32 PM

## 2018-12-19 NOTE — BHH Suicide Risk Assessment (Signed)
[place] INPATIENT:  Family/Significant Other Suicide Prevention Education  Suicide Prevention Education:  Patient Refusal for Family/Significant Other Suicide Prevention Education: The patient Teresa Jefferson has refused to provide written consent for family/significant other to be provided Family/Significant Other Suicide Prevention Education during admission and/or prior to discharge.  Physician notified.  SPE completed with pt, as pt refused to consent to family contact. SPI pamphlet provided to pt and pt was encouraged to share information with support network, ask questions, and talk about any concerns relating to SPE. Pt denies access to guns/firearms and verbalized understanding of information provided. Mobile Crisis information also provided to pt.    [place] MSW LCSW 12/19/2018, 9:49 AM

## 2018-12-19 NOTE — Progress Notes (Signed)
Patient has been experiencing an elevated BP, 179/118 , lisinopril 10 mg po is given , call was placed to Dr. Weber Cooks for others , waiting for call back.

## 2018-12-19 NOTE — Progress Notes (Signed)
Recreation Therapy Notes  INPATIENT RECREATION THERAPY ASSESSMENT  Patient Details Name: Teresa Jefferson MRN: [id number] DOB: [birth year] Today's Date: 12/19/2018       Information Obtained From: Patient  Able to Participate in Assessment/Interview: Yes  Patient Presentation: Responsive  Reason for Admission (Per Patient): Active Symptoms, Med Non-Compliance, Substance Abuse  Patient Stressors:    Coping Skills:   Substance Abuse, Talk, Prayer, Art  Leisure Interests (2+):  Social - Family, Individual - TV, Art - Coloring  Frequency of Recreation/Participation: Monthly  Awareness of Community Resources:  Yes  Community Resources:  Park  Current Use:    If no, Barriers?:    Expressed Interest in [place] of Residence:  Insurance underwriter  Patient Main Form of Transportation: Diplomatic Services operational officer  Patient Strengths:  helping others  Patient Identified Areas of Improvement:  my attitude and listening and thinking  Patient Goal for Hospitalization:  Get back on medication and get stable  Current SI (including self-harm):  No  Current HI:  No  Current AVH: No  Staff Intervention Plan: Group Attendance, Collaborate with Interdisciplinary Treatment Team  Consent to Intern Participation: N/A     12/19/2018, 3:25 PM
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
## 2018-12-19 NOTE — Progress Notes (Signed)
Patient's blood pressure continues to be elevated NP on call notified she gave vistaril 50 mg, will continue to monitor.

## 2018-12-19 NOTE — Plan of Care (Signed)
Pt rates depression and anxiety both 5/10. Pt denies SI, HI and AVH. Pt was educated on care plan and verbalizes understanding. Collier Bullock RN Problem: Education: Goal: Knowledge of disease or condition will improve Outcome: Not Progressing Goal: Understanding of discharge needs will improve Outcome: Not Progressing   Problem: Health Behavior/Discharge Planning: Goal: Ability to identify changes in lifestyle to reduce recurrence of condition will improve Outcome: Not Progressing Goal: Identification of resources available to assist in meeting health care needs will improve Outcome: Not Progressing   Problem: Physical Regulation: Goal: Complications related to the disease process, condition or treatment will be avoided or minimized Outcome: Not Progressing   Problem: Safety: Goal: Ability to remain free from injury will improve Outcome: Not Progressing

## 2018-12-19 NOTE — Progress Notes (Signed)
Recreation Therapy Notes  Date: 12/19/2018  Time: 9:30 am   Location: Craft room   Behavioral response: N/A   Intervention Topic: Values  Discussion/Intervention: Patient did not attend group.   Clinical Observations/Feedback:  Patient did not attend group.     LRT/CTRS           12/19/2018 11:24 AM

## 2018-12-19 NOTE — Progress Notes (Signed)
CSW spoke with Vicente Males from [employer] who reported that pt would have to complete the application and have it faxed over to start the process. CSW accessed the application online and printed it for pt.   CSW spoke with Mateo Flow from [place] who reported that she would fax over the application packet and that pt would need to fill it out and have it faxed back along with PSA and H&P. CSW provided Mateo Flow with the fax number   Evalina Field, MSW, LCSW Clinical Social Work 12/19/2018 11:27 AM

## 2018-12-20 MED ORDER — ONDANSETRON HCL 4 MG PO TABS
4.0000 mg | ORAL_TABLET | Freq: Four times a day (QID) | ORAL | Status: DC | PRN
  Administered 2018-12-20 – 2018-12-21 (×2): 4 mg via ORAL
  Filled 2018-12-20 (×2): qty 1

## 2018-12-20 NOTE — Progress Notes (Signed)
Spoke with Teresa Jefferson at [facility] in [place].  She will review the patient's application and call to do a work up over the phone and hopefully get her accepted into the program.

## 2018-12-20 NOTE — Progress Notes (Signed)
D: Patient has been pleasant and cooperative. Complaining of headache and requested and received Ibuprofen per order. Denies SI, HI and AVH.  A: Continue to monitor for safety R: Safety maintained.

## 2018-12-20 NOTE — BHH Group Notes (Signed)
LCSW Group Therapy Note  12/20/2018 1:00 PM  Type of Therapy/Topic:  Group Therapy:  Balance in Life  Participation Level:  Minimal  Description of Group:    This group will address the concept of balance and how it feels and looks when one is unbalanced. Patients will be encouraged to process areas in their lives that are out of balance and identify reasons for remaining unbalanced. Facilitators will guide patients in utilizing problem-solving interventions to address and correct the stressor making their life unbalanced. Understanding and applying boundaries will be explored and addressed for obtaining and maintaining a balanced life. Patients will be encouraged to explore ways to assertively make their unbalanced needs known to significant others in their lives, using other group members and facilitator for support and feedback.  Therapeutic Goals: 1. Patient will identify two or more emotions or situations they have that consume much of in their lives. 2. Patient will identify signs/triggers that life has become out of balance:  3. Patient will identify two ways to set boundaries in order to achieve balance in their lives:  4. Patient will demonstrate ability to communicate their needs through discussion and/or role plays  Summary of Patient Progress: Patient was present for group.  Patient's participation was minimal. Patient shred that she watches Lifetime as a coping skill.    Therapeutic Modalities:   Cognitive Behavioral Therapy Solution-Focused Therapy Assertiveness Training  Assunta Curtis MSW, LCSW 12/20/2018 11:46 AM

## 2018-12-20 NOTE — Progress Notes (Signed)
[facility] MD Progress Note  12/20/2018 4:45 PM Teresa Jefferson  MRN:  [id number] Subjective: Follow-up for patient with depression and substance abuse.  Patient is having nausea today and still having some opiate withdrawal symptoms.  Mood is somewhat depressed but hopeful for the future.  No active suicidal ideation.  No other specific physical problems.  General aches and pains. Principal Problem: MDD (major depressive disorder), recurrent episode, severe ([place]) Diagnosis: Principal Problem:   MDD (major depressive disorder), recurrent episode, severe ([facility]) Active Problems:   Opiate abuse, continuous ([place])   Cocaine abuse ([place])   Essential hypertension  Total Time spent with patient: 30 minutes  Past Psychiatric History: Past history of substance abuse and depression  Past Medical History:  Past Medical History:  Diagnosis Date  . Bipolar 1 disorder ([place])   . Depression   . Hypertension   . Neuropathy   . Schizophrenia ([place])   . Thyroid disease     Past Surgical History:  Procedure Laterality Date  . ABDOMINAL HYSTERECTOMY    . COLONOSCOPY N/A 11/23/2017   Procedure: COLONOSCOPY;  Surgeon: Lin Landsman, MD;  Location: [facility] ENDOSCOPY;  Service: Gastroenterology;  Laterality: N/A;  . COLONOSCOPY N/A 11/24/2017   Procedure: COLONOSCOPY;  Surgeon: Lin Landsman, MD;  [facility]COPY;  Service: Gastroenterology;  Laterality: N/A;  . KNEE SURGERY Right   . TONSILLECTOMY     Family History: History reviewed. No pertinent family history. Family Psychiatric  History: See previous Social History:  Social History   Substance and Sexual Activity  Alcohol Use No     Social History   Substance and Sexual Activity  Drug Use Not Currently   Comment: crack and opiates    Social History   Socioeconomic History  . Marital status: Legally Separated    Spouse name: Not on file  . Number of children: Not on file  . Years of education: Not on file  . Highest education  level: Not on file  Occupational History  . Not on file  Social Needs  . Financial resource strain: Not on file  . Food insecurity    Worry: Not on file    Inability: Not on file  . Transportation needs    Medical: Not on file    Non-medical: Not on file  Tobacco Use  . Smoking status: Never Smoker  . Smokeless tobacco: Never Used  Substance and Sexual Activity  . Alcohol use: No  . Drug use: Not Currently    Comment: crack and opiates  . Sexual activity: Not on file  Lifestyle  . Physical activity    Days per week: Not on file    Minutes per session: Not on file  . Stress: Not on file  Relationships  . Social Herbalist on phone: Not on file    Gets together: Not on file    Attends religious service: Not on file    Active member of club or organization: Not on file    Attends meetings of clubs or organizations: Not on file    Relationship status: Not on file  Other Topics Concern  . Not on file  Social History Narrative  . Not on file   Additional Social History:                         Sleep: Fair  Appetite:  Fair  Current Medications: Current Facility-Administered Medications  Medication Dose Route Frequency Provider  Last Rate Last Dose  . acetaminophen (TYLENOL) tablet 650 mg  650 mg Oral Q6H PRN Suella Broad, FNP      . alum & mag hydroxide-simeth (MAALOX/MYLANTA) 200-200-20 MG/5ML suspension 30 mL  30 mL Oral Q4H PRN Suella Broad, FNP      . buprenorphine-naloxone (SUBOXONE) 8-2 mg per SL tablet 1 tablet  1 tablet Sublingual BID , [name], MD   1 tablet at 12/20/18 0820  . gabapentin (NEURONTIN) capsule 600 mg  600 mg Oral TID , [name], MD   600 mg at 12/20/18 1220  . guaiFENesin-dextromethorphan (ROBITUSSIN DM) 100-10 MG/5ML syrup 5 mL  5 mL Oral Q6H PRN ,  T, MD   5 mL at 12/20/18 1029  . hydrOXYzine (ATARAX/VISTARIL) tablet 50 mg  50 mg Oral TID PRN Caroline Sauger, NP   50 mg at 12/19/18  2019  . ibuprofen (ADVIL) tablet 800 mg  800 mg Oral Q8H PRN ,  T, MD   800 mg at 12/20/18 1028  . lisinopril (ZESTRIL) tablet 10 mg  10 mg Oral Daily Caroline Sauger, NP   10 mg at 12/20/18 0820  . magnesium hydroxide (MILK OF MAGNESIA) suspension 30 mL  30 mL Oral Daily PRN Starkes-Perry, Gayland Curry, FNP      . ondansetron (ZOFRAN) tablet 4 mg  4 mg Oral Q6H PRN , [name], MD   4 mg at 12/20/18 1029  . QUEtiapine (SEROQUEL) tablet 400 mg  400 mg Oral QHS , [name], MD   400 mg at 12/19/18 2107  . traZODone (DESYREL) tablet 100 mg  100 mg Oral QHS PRN Suella Broad, FNP        Lab Results:  Results for orders placed or performed during the hospital encounter of 12/18/18 (from the past 48 hour(s))  Hemoglobin A1c     Status: Abnormal   Collection Time: 12/19/18  7:11 AM  Result Value Ref Range   Hgb A1c MFr Bld 6.0 (H) 4.8 - 5.6 %    Comment: (NOTE) Pre diabetes:          5.7%-6.4% Diabetes:              >6.4% Glycemic control for   <7.0% adults with diabetes    Mean Plasma Glucose 125.5 mg/dL    Comment: Performed at [facility] [address] [place], [place] [address]  Lipid panel     Status: Abnormal   Collection Time: 12/19/18  7:11 AM  Result Value Ref Range   Cholesterol 190 0 - 200 mg/dL   Triglycerides 141 <150 mg/dL   HDL 60 >40 mg/dL   Total CHOL/HDL Ratio 3.2 RATIO   VLDL 28 0 - 40 mg/dL   LDL Cholesterol 102 (H) 0 - 99 mg/dL    Comment:        Total Cholesterol/HDL:CHD Risk Coronary Heart Disease Risk Table                     Men   Women  1/2 Average Risk   3.4   3.3  Average Risk       5.0   4.4  2 X Average Risk   9.6   7.1  3 X Average Risk  23.4   11.0        Use the calculated Patient Ratio above and the CHD Risk Table to determine the patient's CHD Risk.        ATP III  CLASSIFICATION (LDL):  <100     mg/dL   Optimal  100-129  mg/dL   Near or Above                    Optimal  130-159  mg/dL   Borderline   160-189  mg/dL   High  >190     mg/dL   Very High Performed at [facility], [address] [place], [place] [address]   TSH     Status: Abnormal   Collection Time: 12/19/18  7:11 AM  Result Value Ref Range   TSH 12.991 (H) 0.350 - 4.500 uIU/mL    Comment: Performed by a 3rd Generation assay with a functional sensitivity of <=0.01 uIU/mL. Performed at [facility], [address] [place], [place] [address]     Blood Alcohol level:  Lab Results  Component Value Date   [facility] <10 12/18/2018   ETH <10 98/92/1194    Metabolic Disorder Labs: Lab Results  Component Value Date   HGBA1C 6.0 (H) 12/19/2018   MPG 125.5 12/19/2018   MPG 128.37 11/16/2017   No results found for: PROLACTIN Lab Results  Component Value Date   CHOL 190 12/19/2018   TRIG 141 12/19/2018   HDL 60 12/19/2018   CHOLHDL 3.2 12/19/2018   VLDL 28 12/19/2018   LDLCALC 102 (H) 12/19/2018   LDLCALC 81 11/16/2017    Physical Findings: AIMS: Facial and Oral Movements Muscles of Facial Expression: None, normal Lips and Perioral Area: None, normal Jaw: None, normal Tongue: None, normal,Extremity Movements Upper (arms, wrists, hands, fingers): None, normal Lower (legs, knees, ankles, toes): None, normal, Trunk Movements Neck, shoulders, hips: None, normal, Overall Severity Severity of abnormal movements (highest score from questions above): None, normal Incapacitation due to abnormal movements: None, normal Patient's awareness of abnormal movements (rate only patient's report): No Awareness, Dental Status Current problems with teeth and/or dentures?: No Does patient usually wear dentures?: No  CIWA:  CIWA-Ar Total: 10 COWS:  COWS Total Score: 10  Musculoskeletal: Strength & Muscle Tone: within normal limits Gait & Station: normal Patient leans: N/A  Psychiatric Specialty Exam: Physical Exam  Nursing note and vitals reviewed. Constitutional: She appears well-developed and  well-nourished.  HENT:  Head: Normocephalic and atraumatic.  Eyes: Pupils are equal, round, and reactive to light. Conjunctivae are normal.  Neck: Normal range of motion.  Cardiovascular: Normal heart sounds.  Respiratory: Effort normal.  GI: Soft.  Musculoskeletal: Normal range of motion.  Neurological: She is alert.  Skin: Skin is warm and dry.  Psychiatric: Judgment normal. Her affect is blunt. Her speech is delayed. She is slowed. Cognition and memory are normal. She expresses no suicidal ideation.    Review of Systems  Constitutional: Negative.   HENT: Negative.   Eyes: Negative.   Respiratory: Negative.   Cardiovascular: Negative.   Gastrointestinal: Positive for nausea.  Musculoskeletal: Negative.   Skin: Negative.   Neurological: Negative.   Psychiatric/Behavioral: Positive for depression and substance abuse. Negative for hallucinations and suicidal ideas. The patient is nervous/anxious.     Blood pressure (!) 114/99, pulse 82, temperature 98.2 F (36.8 C), temperature source Oral, resp. rate 17, height 5\' 2"  (1.575 m), SpO2 97 %.Body mass index is 27.44 kg/m.  General Appearance: Casual  Eye Contact:  Fair  Speech:  Slow  Volume:  Decreased  Mood:  Depressed  Affect:  Constricted  Thought Process:  Coherent  Orientation:  Full (Time, Place, and Person)  Thought Content:  Logical  Suicidal Thoughts:  No  Homicidal Thoughts:  No  Memory:  Immediate;   Fair Recent;   Fair Remote;   Fair  Judgement:  Fair  Insight:  Fair  Psychomotor Activity:  Decreased  Concentration:  Concentration: Fair  Recall:  [employer] of Knowledge:  Fair  Language:  Fair  Akathisia:  No  Handed:  Right  AIMS (if indicated):     Assets:  Desire for Improvement Resilience  ADL's:  Impaired  Cognition:  Impaired,  Mild  Sleep:  Number of Hours: 7.25     Treatment Plan Summary: Daily contact with patient to assess and evaluate symptoms and progress in treatment, Medication  management and Plan Added Zofran for nausea.  No change to other psychotropic or physical medicine.  Including substance abuse groups.  Supportive counseling.  Hoping for some kind of referral to inpatient substance abuse rehab.  Continue medicine for detox including Suboxone  Alethia Berthold, MD 12/20/2018, 4:45 PM

## 2018-12-20 NOTE — Plan of Care (Signed)
  Problem: Health Behavior/Discharge Planning: Goal: Ability to identify changes in lifestyle to reduce recurrence of condition will improve Outcome: Progressing Goal: Identification of resources available to assist in meeting health care needs will improve Outcome: Progressing  D: Patient has been pleasant and cooperative. Complaining of headache and requested and received Ibuprofen per order. Denies SI, HI and AVH.  A: Continue to monitor for safety R: Safety maintained.

## 2018-12-20 NOTE — Progress Notes (Signed)
Pt seems anxious today, highly concerned that she is getting all of her medications. She has been interactive with others today and cooperative. Collier Bullock RN

## 2018-12-21 LAB — CBC WITH DIFFERENTIAL/PLATELET
Abs Immature Granulocytes: 0.06 10*3/uL (ref 0.00–0.07)
Basophils Absolute: 0 10*3/uL (ref 0.0–0.1)
Basophils Relative: 0 %
Eosinophils Absolute: 0 10*3/uL (ref 0.0–0.5)
Eosinophils Relative: 0 %
HCT: 34.8 % — ABNORMAL LOW (ref 36.0–46.0)
Hemoglobin: 11 g/dL — ABNORMAL LOW (ref 12.0–15.0)
Immature Granulocytes: 1 %
Lymphocytes Relative: 4 %
Lymphs Abs: 0.5 10*3/uL — ABNORMAL LOW (ref 0.7–4.0)
MCH: 26.8 pg (ref 26.0–34.0)
MCHC: 31.6 g/dL (ref 30.0–36.0)
MCV: 84.9 fL (ref 80.0–100.0)
Monocytes Absolute: 0.5 10*3/uL (ref 0.1–1.0)
Monocytes Relative: 4 %
Neutro Abs: 10.5 10*3/uL — ABNORMAL HIGH (ref 1.7–7.7)
Neutrophils Relative %: 91 %
Platelets: 173 10*3/uL (ref 150–400)
RBC: 4.1 MIL/uL (ref 3.87–5.11)
RDW: 15.7 % — ABNORMAL HIGH (ref 11.5–15.5)
WBC: 11.6 10*3/uL — ABNORMAL HIGH (ref 4.0–10.5)
nRBC: 0 % (ref 0.0–0.2)

## 2018-12-21 LAB — COMPREHENSIVE METABOLIC PANEL
ALT: 16 U/L (ref 0–44)
AST: 19 U/L (ref 15–41)
Albumin: 4.1 g/dL (ref 3.5–5.0)
Alkaline Phosphatase: 54 U/L (ref 38–126)
Anion gap: 8 (ref 5–15)
BUN: 32 mg/dL — ABNORMAL HIGH (ref 6–20)
CO2: 24 mmol/L (ref 22–32)
Calcium: 8.6 mg/dL — ABNORMAL LOW (ref 8.9–10.3)
Chloride: 105 mmol/L (ref 98–111)
Creatinine, Ser: 0.77 mg/dL (ref 0.44–1.00)
GFR calc Af Amer: >60 mL/min (ref >60)
GFR calc non Af Amer: >60 mL/min (ref >60)
Glucose, Bld: 134 mg/dL — ABNORMAL HIGH (ref 70–99)
Potassium: 4.3 mmol/L (ref 3.5–5.1)
Sodium: 137 mmol/L (ref 135–145)
Total Bilirubin: 0.5 mg/dL (ref 0.3–1.2)
Total Protein: 7.5 g/dL (ref 6.5–8.1)

## 2018-12-21 MED ORDER — IBUPROFEN 800 MG PO TABS: 800.0000 mg | ORAL_TABLET | Freq: Three times a day (TID) | ORAL | 1 refills | Status: DC | PRN

## 2018-12-21 MED ORDER — QUETIAPINE FUMARATE 400 MG PO TABS: 400.0000 mg | ORAL_TABLET | Freq: Every day | ORAL | 0 refills | Status: DC

## 2018-12-21 MED ORDER — IBUPROFEN 800 MG PO TABS: 800.0000 mg | ORAL_TABLET | Freq: Three times a day (TID) | ORAL | 0 refills | Status: DC | PRN

## 2018-12-21 MED ORDER — TRAZODONE HCL 100 MG PO TABS: 100.0000 mg | ORAL_TABLET | Freq: Every evening | ORAL | 0 refills | Status: DC | PRN

## 2018-12-21 MED ORDER — LISINOPRIL 10 MG PO TABS: 10.0000 mg | ORAL_TABLET | Freq: Every day | ORAL | 0 refills | Status: DC

## 2018-12-21 MED ORDER — QUETIAPINE FUMARATE 400 MG PO TABS: 400.0000 mg | ORAL_TABLET | Freq: Every day | ORAL | 1 refills | Status: AC

## 2018-12-21 MED ORDER — GABAPENTIN 300 MG PO CAPS: 600.0000 mg | ORAL_CAPSULE | Freq: Three times a day (TID) | ORAL | 1 refills | Status: DC

## 2018-12-21 MED ORDER — HYDROXYZINE HCL 50 MG PO TABS: 50.0000 mg | ORAL_TABLET | Freq: Three times a day (TID) | ORAL | 1 refills | Status: DC | PRN

## 2018-12-21 MED ORDER — TRAZODONE HCL 100 MG PO TABS: 100.0000 mg | ORAL_TABLET | Freq: Every evening | ORAL | 1 refills | Status: DC | PRN

## 2018-12-21 MED ORDER — GABAPENTIN 300 MG PO CAPS: 600.0000 mg | ORAL_CAPSULE | Freq: Three times a day (TID) | ORAL | 0 refills | Status: DC

## 2018-12-21 MED ORDER — OXYCODONE HCL 5 MG PO TABS
5.0000 mg | ORAL_TABLET | Freq: Once | ORAL | Status: AC
  Administered 2018-12-21: 5 mg via ORAL
  Filled 2018-12-21: qty 1

## 2018-12-21 MED ORDER — LISINOPRIL 10 MG PO TABS: 10.0000 mg | ORAL_TABLET | Freq: Every day | ORAL | 1 refills | Status: DC

## 2018-12-21 MED ORDER — HYDROXYZINE HCL 50 MG PO TABS: 50.0000 mg | ORAL_TABLET | Freq: Three times a day (TID) | ORAL | 0 refills | Status: DC | PRN

## 2018-12-21 NOTE — Plan of Care (Signed)
  Problem: Safety: Goal: Ability to remain free from injury will improve Outcome: Progressing  Patient is free of injury on the unit.

## 2018-12-21 NOTE — Progress Notes (Signed)
CSW spoke with [facility] who reported they would call CSW back regarding whether or not pt can come to the program.  CSW faxed information to [place], [place] and spoke with Nurse Patty who asked a few follow up questions and reported that she would review pt with the supervisor and give CSW a call back in about 2 hours regarding whether or not pt can come to their program. Per Patty, it is possible pt will be able to come today, but she will need 7 day supply of medications to come with her.  Evalina Field, MSW, LCSW Clinical Social Work 12/21/2018 11:12 AM

## 2018-12-21 NOTE — BHH Group Notes (Signed)
LCSW Group Therapy Note  12/21/2018 11:28 AM  Type of Therapy and Topic:  Group Therapy:  Feelings around Relapse and Recovery  Participation Level:  Did Not Attend   Description of Group:    Patients in this group will discuss emotions they experience before and after a relapse. They will process how experiencing these feelings, or avoidance of experiencing them, relates to having a relapse. Facilitator will guide patients to explore emotions they have related to recovery. Patients will be encouraged to process which emotions are more powerful. They will be guided to discuss the emotional reaction significant others in their lives may have to their relapse or recovery. Patients will be assisted in exploring ways to respond to the emotions of others without this contributing to a relapse.  Therapeutic Goals: 1. Patient will identify two or more emotions that lead to a relapse for them 2. Patient will identify two emotions that result when they relapse 3. Patient will identify two emotions related to recovery 4. Patient will demonstrate ability to communicate their needs through discussion and/or role plays   Summary of Patient Progress: x    Therapeutic Modalities:   Cognitive Behavioral Therapy Solution-Focused Therapy Assertiveness Training Relapse Prevention Therapy   Evalina Field, MSW, LCSW Clinical Social Work 12/21/2018 11:28 AM

## 2018-12-21 NOTE — Discharge Summary (Signed)
Physician Discharge Summary Note  Patient:  Teresa Jefferson is an 53 y.o., female MRN:  [id number] DOB:  [birth year] Patient phone:  [phone] (home)  Patient address:   [address] [place] [address],  Total Time spent with patient: 45 minutes  Date of Admission:  12/18/2018 Date of Discharge: December 21, 2018  Reason for Admission: Admitted through the emergency room where she presented with depression and anxiety with suicidal ideation and substance abuse requiring detox from narcotics  Principal Problem: MDD (major depressive disorder), recurrent episode, severe ([place]) Discharge Diagnoses: Principal Problem:   MDD (major depressive disorder), recurrent episode, severe ([place]) Active Problems:   Opiate abuse, continuous ([place])   Cocaine abuse ([place])   Essential hypertension   Past Psychiatric History: Patient has a long history of substance abuse especially narcotics as well as problems with depression and anxiety  Past Medical History:  Past Medical History:  Diagnosis Date  . Bipolar 1 disorder ([place])   . Depression   . Hypertension   . Neuropathy   . Schizophrenia ([place])   . Thyroid disease     Past Surgical History:  Procedure Laterality Date  . ABDOMINAL HYSTERECTOMY    . COLONOSCOPY N/A 11/23/2017   Procedure: COLONOSCOPY;  Surgeon: Lin Landsman, MD;  Location: [facility] ENDOSCOPY;  Service: Gastroenterology;  Laterality: N/A;  . COLONOSCOPY N/A 11/24/2017   Procedure: COLONOSCOPY;  Surgeon: Lin Landsman, MD;  Location: [facility] ENDOSCOPY;  Service: Gastroenterology;  Laterality: N/A;  . KNEE SURGERY Right   . TONSILLECTOMY     Family History: History reviewed. No pertinent family history. Family Psychiatric  History: See previous Social History:  Social History   Substance and Sexual Activity  Alcohol Use No     Social History   Substance and Sexual Activity  Drug Use Not Currently   Comment: crack and opiates    Social History    Socioeconomic History  . Marital status: Legally Separated    Spouse name: Not on file  . Number of children: Not on file  . Years of education: Not on file  . Highest education level: Not on file  Occupational History  . Not on file  Social Needs  . Financial resource strain: Not on file  . Food insecurity    Worry: Not on file    Inability: Not on file  . Transportation needs    Medical: Not on file    Non-medical: Not on file  Tobacco Use  . Smoking status: Never Smoker  . Smokeless tobacco: Never Used  Substance and Sexual Activity  . Alcohol use: No  . Drug use: Not Currently    Comment: crack and opiates  . Sexual activity: Not on file  Lifestyle  . Physical activity    Days per week: Not on file    Minutes per session: Not on file  . Stress: Not on file  Relationships  . Social Herbalist on phone: Not on file    Gets together: Not on file    Attends religious service: Not on file    Active member of club or organization: Not on file    Attends meetings of clubs or organizations: Not on file    Relationship status: Not on file  Other Topics Concern  . Not on file  Social History Narrative  . Not on file    Hospital Course: Admitted to the psychiatric hospital.  15-minute checks in place.  Engaged in individual and group  therapy.  Patient was provided with Suboxone for detox with a plan to use for only 72 hours to control withdrawal during these 3 days.  She was requesting referral to inpatient substance abuse treatment.  Meanwhile she was placed back on medicine she had found helpful in the past especially Seroquel.  Patient tolerated medicine well.  She did have symptoms of detox including sweats shakes and nausea but still was able to participate appropriately.  She did not engage in any dangerous behavior or suicidal behavior on the unit.  Patient cooperated with social work in finding an inpatient program.  Today she has been accepted to an  inpatient program in [place] and we are arranging for discharge with a 7-day supply provided as well as prescriptions  Physical Findings: AIMS: Facial and Oral Movements Muscles of Facial Expression: None, normal Lips and Perioral Area: None, normal Jaw: None, normal Tongue: None, normal,Extremity Movements Upper (arms, wrists, hands, fingers): None, normal Lower (legs, knees, ankles, toes): None, normal, Trunk Movements Neck, shoulders, hips: None, normal, Overall Severity Severity of abnormal movements (highest score from questions above): None, normal Incapacitation due to abnormal movements: None, normal Patient's awareness of abnormal movements (rate only patient's report): No Awareness, Dental Status Current problems with teeth and/or dentures?: No Does patient usually wear dentures?: No  CIWA:  CIWA-Ar Total: 10 COWS:  COWS Total Score: 10  Musculoskeletal: Strength & Muscle Tone: within normal limits Gait & Station: normal Patient leans: N/A  Psychiatric Specialty Exam: Physical Exam  Nursing note and vitals reviewed. Constitutional: She appears well-developed and well-nourished.  HENT:  Head: Normocephalic and atraumatic.  Eyes: Pupils are equal, round, and reactive to light. Conjunctivae are normal.  Neck: Normal range of motion.  Cardiovascular: Regular rhythm and normal heart sounds.  Respiratory: Effort normal.  GI: Soft.  Musculoskeletal: Normal range of motion.  Neurological: She is alert.  Skin: Skin is warm and dry.  Psychiatric: She has a normal mood and affect. Her speech is normal and behavior is normal. Judgment and thought content normal. She is not agitated. Cognition and memory are normal.    Review of Systems  Constitutional: Negative.   HENT: Negative.   Eyes: Negative.   Respiratory: Negative.   Cardiovascular: Negative.   Gastrointestinal: Negative.   Musculoskeletal: Negative.   Skin: Negative.   Neurological: Negative.    Psychiatric/Behavioral: Positive for substance abuse. Negative for depression and suicidal ideas. The patient is nervous/anxious.     Blood pressure (!) 154/89, pulse 92, temperature 98.6 F (37 C), resp. rate 18, height 5\' 2"  (1.575 m), SpO2 (!) 88 %.Body mass index is 27.44 kg/m.  General Appearance: Casual  Eye Contact:  Good  Speech:  Clear and Coherent  Volume:  Normal  Mood:  Euthymic  Affect:  Constricted  Thought Process:  Goal Directed  Orientation:  Full (Time, Place, and Person)  Thought Content:  Logical  Suicidal Thoughts:  No  Homicidal Thoughts:  No  Memory:  Immediate;   Fair Recent;   Fair Remote;   Fair  Judgement:  Fair  Insight:  Fair  Psychomotor Activity:  Normal  Concentration:  Concentration: Fair  Recall:  [employer] of Knowledge:  Fair  Language:  Fair  Akathisia:  No  Handed:  Right  AIMS (if indicated):     Assets:  Desire for Improvement  ADL's:  Intact  Cognition:  WNL  Sleep:  Number of Hours: 7.5     Have you used any form of  tobacco in the last 30 days? (Cigarettes, Smokeless Tobacco, Cigars, and/or Pipes): Yes  Has this patient used any form of tobacco in the last 30 days? (Cigarettes, Smokeless Tobacco, Cigars, and/or Pipes) Yes, No  Blood Alcohol level:  Lab Results  Component Value Date   ETH <10 12/18/2018   ETH <10 03/70/4888    Metabolic Disorder Labs:  Lab Results  Component Value Date   HGBA1C 6.0 (H) 12/19/2018   MPG 125.5 12/19/2018   MPG 128.37 11/16/2017   No results found for: PROLACTIN Lab Results  Component Value Date   CHOL 190 12/19/2018   TRIG 141 12/19/2018   HDL 60 12/19/2018   CHOLHDL 3.2 12/19/2018   VLDL 28 12/19/2018   LDLCALC 102 (H) 12/19/2018   [place] 81 11/16/2017    See Psychiatric Specialty Exam and Suicide Risk Assessment completed by Attending Physician prior to discharge.  Discharge destination:  Other:  Recovery program in [place]  Is patient on multiple antipsychotic  therapies at discharge:  No   Has Patient had three or more failed trials of antipsychotic monotherapy by history:  No  Recommended Plan for Multiple Antipsychotic Therapies: NA  Discharge Instructions    Diet - low sodium heart healthy   Complete by: As directed    Increase activity slowly   Complete by: As directed      Allergies as of 12/21/2018      Reactions   Amoxicillin Rash   Effexor [venlafaxine] Rash   "whelps"   Ketorolac Itching   Lamotrigine Rash   Sulfa Antibiotics Itching      Medication List    STOP taking these medications   buprenorphine-naloxone 8-2 mg Subl SL tablet Commonly known as: SUBOXONE   gabapentin 600 MG tablet Commonly known as: NEURONTIN Replaced by: gabapentin 300 MG capsule     TAKE these medications     Indication  gabapentin 300 MG capsule Commonly known as: NEURONTIN Take 2 capsules (600 mg total) by mouth 3 (three) times daily. Replaces: gabapentin 600 MG tablet  Indication: Neuropathic Pain   hydrOXYzine 50 MG tablet Commonly known as: ATARAX/VISTARIL Take 1 tablet (50 mg total) by mouth 3 (three) times daily as needed for anxiety.  Indication: Feeling Anxious   ibuprofen 800 MG tablet Commonly known as: ADVIL Take 1 tablet (800 mg total) by mouth every 8 (eight) hours as needed for moderate pain. What changed: reasons to take this  Indication: Arthritis, Muscle Pain, Pain   lisinopril 10 MG tablet Commonly known as: ZESTRIL Take 1 tablet (10 mg total) by mouth daily.  Indication: High Blood Pressure Disorder   QUEtiapine 400 MG tablet Commonly known as: SEROquel Take 1 tablet (400 mg total) by mouth at bedtime.  Indication: Major Depressive Disorder   traZODone 100 MG tablet Commonly known as: DESYREL Take 1 tablet (100 mg total) by mouth at bedtime as needed for sleep.  Indication: Trouble Sleeping        Follow-up recommendations:  Activity:  Activity as tolerated Diet:  Regular diet Other:  Inpatient  substance abuse treatment program and then follow-up with outpatient providers  Comments: 7-day supply plus prescriptions given  Signed: Alethia Berthold, MD 12/21/2018, 3:37 PM

## 2018-12-21 NOTE — Progress Notes (Signed)
  [facility] Adult Case Management Discharge Plan :  Will you be returning to the same living situation after discharge:  No. At discharge, do you have transportation home?: Yes,  taxi Do you have the ability to pay for your medications: Yes,  medicaid  Release of information consent forms completed and in the chart;    Patient to Follow up at: Follow-up Information    [facility] Follow up.   Why: You have been accepted. Thank You! Contact information: [address] [place], [place] [address] Phone: [phone] Fax: [phone]          Next level of care provider has access to [place] and Suicide Prevention discussed: Yes,  SPE completed with pt  Have you used any form of tobacco in the last 30 days? (Cigarettes, Smokeless Tobacco, Cigars, and/or Pipes): Yes  Has patient been referred to the Quitline?: Patient refused referral  Patient has been referred for addiction treatment: Yes  Delfin Edis, LCSW 12/21/2018, 3:53 PM

## 2018-12-21 NOTE — Progress Notes (Signed)
[facility] MD Progress Note  12/21/2018 12:24 PM Teresa Jefferson  MRN:  [id number] Subjective: Follow-up for this patient with depression and polysubstance abuse and opiate withdrawal.  Patient today is complaining of several physical symptoms.  She is sweaty and was noted to have a very low-grade temperature but is feeling warm.  She continues to complain of a cough.  She is complaining of nausea and says that she has been vomiting "black stuff" today.  Has not been feeling hungry.  Not suicidal but mood dysphoric mostly about her illness.  Patient still is interested in going to inpatient rehab.  She has been continuing to receive Suboxone for detox although she may be near the end of her course. Principal Problem: MDD (major depressive disorder), recurrent episode, severe ([place]) Diagnosis: Principal Problem:   MDD (major depressive disorder), recurrent episode, severe ([facility]) Active Problems:   Opiate abuse, continuous ([place])   Cocaine abuse ([place])   Essential hypertension  Total Time spent with patient: 30 minutes  Past Psychiatric History: Patient has a history of depression and substance abuse  Past Medical History:  Past Medical History:  Diagnosis Date  . Bipolar 1 disorder ([place])   . Depression   . Hypertension   . Neuropathy   . Schizophrenia ([place])   . Thyroid disease     Past Surgical History:  Procedure Laterality Date  . ABDOMINAL HYSTERECTOMY    . COLONOSCOPY N/A 11/23/2017   Procedure: COLONOSCOPY;  Surgeon: Lin Landsman, MD;  Location: [facility] ENDOSCOPY;  Service: Gastroenterology;  Laterality: N/A;  . COLONOSCOPY N/A 11/24/2017   Procedure: COLONOSCOPY;  Surgeon: Lin Landsman, MD;  Location: [facility] ENDOSCOPY;  Service: Gastroenterology;  Laterality: N/A;  . KNEE SURGERY Right   . TONSILLECTOMY     Family History: History reviewed. No pertinent family history. Family Psychiatric  History: previous  Social History:  Social History   Substance and Sexual Activity   Alcohol Use No     Social History   Substance and Sexual Activity  Drug Use Not Currently   Comment: crack and opiates    Social History   Socioeconomic History  . Marital status: Legally Separated    Spouse name: Not on file  . Number of children: Not on file  . Years of education: Not on file  . Highest education level: Not on file  Occupational History  . Not on file  Social Needs  . Financial resource strain: Not on file  . Food insecurity    Worry: Not on file    Inability: Not on file  . Transportation needs    Medical: Not on file    Non-medical: Not on file  Tobacco Use  . Smoking status: Never Smoker  . Smokeless tobacco: Never Used  Substance and Sexual Activity  . Alcohol use: No  . Drug use: Not Currently    Comment: crack and opiates  . Sexual activity: Not on file  Lifestyle  . Physical activity    Days per week: Not on file    Minutes per session: Not on file  . Stress: Not on file  Relationships  . Social Herbalist on phone: Not on file    Gets together: Not on file    Attends religious service: Not on file    Active member of club or organization: Not on file    Attends meetings of clubs or organizations: Not on file    Relationship status: Not on file  Other  Topics Concern  . Not on file  Social History Narrative  . Not on file   Additional Social History:                         Sleep: Poor  Appetite:  Poor  Current Medications: Current Facility-Administered Medications  Medication Dose Route Frequency Provider Last Rate Last Dose  . acetaminophen (TYLENOL) tablet 650 mg  650 mg Oral Q6H PRN Suella Broad, FNP   650 mg at 12/20/18 1800  . alum & mag hydroxide-simeth (MAALOX/MYLANTA) 200-200-20 MG/5ML suspension 30 mL  30 mL Oral Q4H PRN Burt Ek, Gayland Curry, FNP      . buprenorphine-naloxone (SUBOXONE) 8-2 mg per SL tablet 1 tablet  1 tablet Sublingual BID , [name], MD   1 tablet at 12/21/18  1004  . gabapentin (NEURONTIN) capsule 600 mg  600 mg Oral TID , [name], MD   600 mg at 12/21/18 1006  . guaiFENesin-dextromethorphan (ROBITUSSIN DM) 100-10 MG/5ML syrup 5 mL  5 mL Oral Q6H PRN ,  T, MD   5 mL at 12/20/18 1659  . hydrOXYzine (ATARAX/VISTARIL) tablet 50 mg  50 mg Oral TID PRN Caroline Sauger, NP   50 mg at 12/20/18 2108  . ibuprofen (ADVIL) tablet 800 mg  800 mg Oral Q8H PRN ,  T, MD   800 mg at 12/21/18 1013  . lisinopril (ZESTRIL) tablet 10 mg  10 mg Oral Daily Caroline Sauger, NP   10 mg at 12/21/18 1005  . magnesium hydroxide (MILK OF MAGNESIA) suspension 30 mL  30 mL Oral Daily PRN Suella Broad, FNP      . ondansetron (ZOFRAN) tablet 4 mg  4 mg Oral Q6H PRN , [name], MD   4 mg at 12/21/18 0802  . oxyCODONE (Oxy IR/ROXICODONE) immediate release tablet 5 mg  5 mg Oral Once ,  T, MD      . QUEtiapine (SEROQUEL) tablet 400 mg  400 mg Oral QHS , [name], MD   400 mg at 12/20/18 2108  . traZODone (DESYREL) tablet 100 mg  100 mg Oral QHS PRN Suella Broad, FNP   100 mg at 12/20/18 2108    Lab Results: No results found for this or any previous visit (from the past 48 hour(s)).  Blood Alcohol level:  Lab Results  Component Value Date   ETH <10 12/18/2018   ETH <10 17/49/4496    Metabolic Disorder Labs: Lab Results  Component Value Date   HGBA1C 6.0 (H) 12/19/2018   MPG 125.5 12/19/2018   MPG 128.37 11/16/2017   No results found for: PROLACTIN Lab Results  Component Value Date   CHOL 190 12/19/2018   TRIG 141 12/19/2018   HDL 60 12/19/2018   CHOLHDL 3.2 12/19/2018   VLDL 28 12/19/2018   LDLCALC 102 (H) 12/19/2018   LDLCALC 81 11/16/2017    Physical Findings: AIMS: Facial and Oral Movements Muscles of Facial Expression: None, normal Lips and Perioral Area: None, normal Jaw: None, normal Tongue: None, normal,Extremity Movements Upper (arms, wrists, hands, fingers): None,  normal Lower (legs, knees, ankles, toes): None, normal, Trunk Movements Neck, shoulders, hips: None, normal, Overall Severity Severity of abnormal movements (highest score from questions above): None, normal Incapacitation due to abnormal movements: None, normal Patient's awareness of abnormal movements (rate only patient's report): No Awareness, Dental Status Current problems with teeth and/or dentures?: No Does patient usually wear dentures?: No  CIWA:  CIWA-Ar  Total: 10 COWS:  COWS Total Score: 10  Musculoskeletal: Strength & Muscle Tone: within normal limits Gait & Station: normal Patient leans: N/A  Psychiatric Specialty Exam: Physical Exam  Nursing note and vitals reviewed. Constitutional: She appears well-developed and well-nourished.  HENT:  Head: Normocephalic and atraumatic.  Eyes: Pupils are equal, round, and reactive to light. Conjunctivae are normal.  Neck: Normal range of motion.  Cardiovascular: Regular rhythm and normal heart sounds.  Respiratory: Effort normal. No respiratory distress.  GI: Soft.  Musculoskeletal: Normal range of motion.  Neurological: She is alert.  Skin: Skin is warm. She is diaphoretic.  Psychiatric: Judgment normal. Her affect is blunt. Her speech is delayed. She is slowed. Cognition and memory are normal. Cognition and memory are not impaired. She expresses no homicidal and no suicidal ideation.    Review of Systems  Constitutional: Negative.   HENT: Negative.   Eyes: Negative.   Respiratory: Positive for cough.   Cardiovascular: Negative.   Gastrointestinal: Positive for nausea and vomiting.  Musculoskeletal: Negative.   Skin: Negative.   Neurological: Negative.   Psychiatric/Behavioral: Positive for substance abuse. Negative for depression, hallucinations, memory loss and suicidal ideas. The patient is nervous/anxious and has insomnia.     Blood pressure (!) 154/89, pulse 92, temperature 99 F (37.2 C), temperature source Oral,  resp. rate 18, height 5\' 2"  (1.575 m), SpO2 (!) 88 %.Body mass index is 27.44 kg/m.  General Appearance: Disheveled  Eye Contact:  Minimal  Speech:  Slow  Volume:  Decreased  Mood:  Dysphoric  Affect:  Congruent  Thought Process:  Coherent  Orientation:  Full (Time, Place, and Person)  Thought Content:  Logical  Suicidal Thoughts:  No  Homicidal Thoughts:  No  Memory:  Immediate;   Fair Recent;   Fair Remote;   Poor  Judgement:  Fair  Insight:  Fair  Psychomotor Activity:  Decreased and Restlessness  Concentration:  Concentration: Fair  Recall:  [employer] of Knowledge:  Fair  Language:  Fair  Akathisia:  No  Handed:  Right  AIMS (if indicated):     Assets:  Communication Skills Desire for Improvement  ADL's:  Impaired  Cognition:  WNL  Sleep:  Number of Hours: 7.5     Treatment Plan Summary: Daily contact with patient to assess and evaluate symptoms and progress in treatment, Medication management and Plan Patient with opiate withdrawal and depression.  Today complaining of several physical symptoms including headache nausea and vomiting general malaise subjective feeling of feverishness.  Patient also comes in now and shows me that she has petechia bilaterally on her lower legs and feet.  Says that this is happened before.  Not itching.  Not entirely clear what to make of it I am going to give her an oxycodone for her headache.  She is already taking a lot of Motrin.  May or may not help given that she is still on Suboxone.  Meanwhile we will order another coronavirus test out of an abundance of caution to be sure that we would not be in the position of missing this diagnosis or sending someone to another facility without a confirmatory test.  I will look into whether we need to do any other acute labs to check for now.  Recheck vital signs more frequently  Alethia Berthold, MD 12/21/2018, 12:24 PM

## 2018-12-21 NOTE — Progress Notes (Signed)
Patient alert and oriented x 4, affect is blunted, her thoughts are organized and coherent , she denies SI/HI/AVH, she is interacting appropriately with staff. Patient appears demanding and medication seeking with various medication request, she is complaint with medication regimen, no distress noted, 15 minutes safety checks maintained will continue to monitor.

## 2018-12-21 NOTE — Progress Notes (Signed)
Patient called the nurses station and asked the nurse to go to her room to see something. Reported that she had just vomited "black stuff". Patient reported that is was blood. No blood seen upon assessment.

## 2018-12-21 NOTE — Progress Notes (Signed)
CSw received a phone call from [place] with [facility] program who reported pt would need to call her at [phone] ext [phone] before 4:00 today to complete an intake and phone interview. CSW passed the information on to pt and encouraged her to call ASAP.  CSW followed up with [place] who reported that pt has a better chance getting into [facility] due to them accepting pt and holding a bed and them not holding a bed at [facility] and pt would have to show up and do the admitting process and may not get a bed.   CSW received a call back from [place] with [facility] who reported that pt has been admitted and accepted into their program. She will need a 7 day supply and they recommend pt coming before shift change at [place], MSW, LCSW Clinical Social Work 12/21/2018 3:13 PM

## 2018-12-21 NOTE — Plan of Care (Signed)
Has been complaining of nausea and vomiting. Anxious , irritable and helpless. Complaining that she has been vomiting "black stuff" throughout the night. Was encouraged to call RN next time she vomits and not to flash before emesis assessment.

## 2018-12-21 NOTE — Progress Notes (Signed)
Patient discharged per MD order. Medications and prescriptions provided. Belongings returned. Patient denies SI/HI upon discharge. Motivated for treatment continuity in another program. No sign of distress upon discharge.

## 2018-12-21 NOTE — [facility] Suicide Risk Assessment (Signed)
[facility] Discharge Suicide Risk Assessment   Principal Problem: MDD (major depressive disorder), recurrent episode, severe ([place]) Discharge Diagnoses: Principal Problem:   MDD (major depressive disorder), recurrent episode, severe ([place]) Active Problems:   Opiate abuse, continuous ([place])   Cocaine abuse ([place])   Essential hypertension   Total Time spent with patient: 45 minutes  Musculoskeletal: Strength & Muscle Tone: within normal limits Gait & Station: normal Patient leans: N/A  Psychiatric Specialty Exam: Review of Systems  Constitutional: Negative.   HENT: Negative.   Eyes: Negative.   Respiratory: Negative.   Cardiovascular: Negative.   Gastrointestinal: Positive for nausea and vomiting.  Musculoskeletal: Negative.   Skin: Negative.   Neurological: Positive for headaches.  Psychiatric/Behavioral: Negative for depression, hallucinations, substance abuse and suicidal ideas. The patient is nervous/anxious and has insomnia.     Blood pressure (!) 154/89, pulse 92, temperature 99 F (37.2 C), temperature source Oral, resp. rate 18, height 5\' 2"  (1.575 m), SpO2 (!) 88 %.Body mass index is 27.44 kg/m.  General Appearance: Disheveled  Eye Contact::  Minimal  Speech:  Slow409  Volume:  Decreased  Mood:  Dysphoric  Affect:  Constricted  Thought Process:  Coherent  Orientation:  Full (Time, Place, and Person)  Thought Content:  Logical  Suicidal Thoughts:  No  Homicidal Thoughts:  No  Memory:  Immediate;   Fair Recent;   Fair Remote;   Fair  Judgement:  Fair  Insight:  Fair  Psychomotor Activity:  Decreased  Concentration:  Fair  Recall:  [employer] of Knowledge:Fair  Language: Fair  Akathisia:  No  Handed:  Right  AIMS (if indicated):     Assets:  Desire for Improvement Resilience  Sleep:  Number of Hours: 7.5  Cognition: WNL  ADL's:  Impaired   Mental Status Per Nursing Assessment::   On Admission:  NA  Demographic Factors:  Unemployed  Loss  Factors: Decline in physical health  Historical Factors: Impulsivity  Risk Reduction Factors:   Sense of responsibility to family, Religious beliefs about death, Living with another person, especially a relative and Positive social support  Continued Clinical Symptoms:  Alcohol/Substance Abuse/Dependencies  Cognitive Features That Contribute To Risk:  None    Suicide Risk:  Minimal: No identifiable suicidal ideation.  Patients presenting with no risk factors but with morbid ruminations; may be classified as minimal risk based on the severity of the depressive symptoms    Plan Of Care/Follow-up recommendations:  Activity:  Activity as tolerated Diet:  regular Other:  plan for transfer to inpatient rehab  Alethia Berthold, MD 12/21/2018, 12:45 PM

## 2019-03-04 ENCOUNTER — Emergency Department: Payer: Medicaid Other

## 2019-03-04 ENCOUNTER — Other Ambulatory Visit: Payer: Self-pay

## 2019-03-04 ENCOUNTER — Emergency Department
Admission: EM | Admit: 2019-03-04 | Discharge: 2019-03-04 | Disposition: A | Discharge status: Home / Self Care | Payer: Medicaid Other | Attending: Emergency Medicine | Admitting: Emergency Medicine

## 2019-03-04 DIAGNOSIS — J45909 Unspecified asthma, uncomplicated: Secondary | ICD-10-CM | POA: Diagnosis not present

## 2019-03-04 DIAGNOSIS — R319 Hematuria, unspecified: Secondary | ICD-10-CM | POA: Diagnosis present

## 2019-03-04 DIAGNOSIS — I10 Essential (primary) hypertension: Secondary | ICD-10-CM | POA: Diagnosis not present

## 2019-03-04 DIAGNOSIS — Z79899 Other long term (current) drug therapy: Secondary | ICD-10-CM | POA: Insufficient documentation

## 2019-03-04 LAB — CBC
HCT: 39.9 % (ref 36.0–46.0)
Hemoglobin: 12.8 g/dL (ref 12.0–15.0)
MCH: 26.9 pg (ref 26.0–34.0)
MCHC: 32.1 g/dL (ref 30.0–36.0)
MCV: 84 fL (ref 80.0–100.0)
Platelets: 217 10*3/uL (ref 150–400)
RBC: 4.75 MIL/uL (ref 3.87–5.11)
RDW: 14.1 % (ref 11.5–15.5)
WBC: 5.1 10*3/uL (ref 4.0–10.5)
nRBC: 0 % (ref 0.0–0.2)

## 2019-03-04 LAB — URINALYSIS, COMPLETE (UACMP) WITH MICROSCOPIC
Bacteria, UA: NONE SEEN
RBC / HPF: >50 RBC/hpf — ABNORMAL HIGH (ref 0–5)
Specific Gravity, Urine: 1.022 (ref 1.005–1.030)
WBC, UA: NONE SEEN WBC/hpf (ref 0–5)

## 2019-03-04 LAB — COMPREHENSIVE METABOLIC PANEL
ALT: 14 U/L (ref 0–44)
AST: 20 U/L (ref 15–41)
Albumin: 4.7 g/dL (ref 3.5–5.0)
Alkaline Phosphatase: 65 U/L (ref 38–126)
Anion gap: 6 (ref 5–15)
BUN: 30 mg/dL — ABNORMAL HIGH (ref 6–20)
CO2: 26 mmol/L (ref 22–32)
Calcium: 9.8 mg/dL (ref 8.9–10.3)
Chloride: 107 mmol/L (ref 98–111)
Creatinine, Ser: 1.05 mg/dL — ABNORMAL HIGH (ref 0.44–1.00)
GFR calc Af Amer: >60 mL/min (ref >60)
GFR calc non Af Amer: >60 mL/min (ref >60)
Glucose, Bld: 112 mg/dL — ABNORMAL HIGH (ref 70–99)
Potassium: 4.1 mmol/L (ref 3.5–5.1)
Sodium: 139 mmol/L (ref 135–145)
Total Bilirubin: 0.6 mg/dL (ref 0.3–1.2)
Total Protein: 9 g/dL — ABNORMAL HIGH (ref 6.5–8.1)

## 2019-03-04 MED ORDER — KETOROLAC TROMETHAMINE 30 MG/ML IJ SOLN
30.0000 mg | Freq: Once | INTRAMUSCULAR | Status: AC
  Administered 2019-03-04: 30 mg via INTRAMUSCULAR
  Filled 2019-03-04: qty 1

## 2019-03-04 NOTE — ED Triage Notes (Signed)
Pt in with co vaginal bleeding that started today, states has had hysterectomy in 1993. Denies any hx of the same, denies any burning or urgency or urination. Pt is having mid lower abd pain.

## 2019-03-04 NOTE — ED Provider Notes (Signed)
[facility] Emergency Department Provider Note   ____________________________________________    I have reviewed the triage vital signs and the nursing notes.   HISTORY  Chief Complaint Vaginal Bleeding     HPI Teresa Jefferson is a 53 y.o. female who presents with complaints of urinary bleeding.  Initially the patient thought that she was having vaginal bleeding as this started earlier today however upon further investigation she discovered that this is coming from her urethra.  She complains of some mild low back pain which appears to be chronic.  Denies a history of kidney stones.  Review of medical records demonstrates history of pyelonephritis in the past.  However she denies dysuria.has not take anything for this  Past Medical History:  Diagnosis Date  . Bipolar 1 disorder ([place])   . Depression   . Hypertension   . Neuropathy   . Schizophrenia ([place])   . Thyroid disease     Patient Active Problem List   Diagnosis Date Noted  . MDD (major depressive disorder), recurrent episode, severe ([place]) 12/18/2018  . MDD (major depressive disorder), recurrent, severe, with psychosis ([place]) 12/05/2017  . Essential hypertension 11/17/2017  . Asthma 11/17/2017  . 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 Cocaine use disorder, severe, dependence ([place]) 11/17/2017  . Opioid use disorder, severe, dependence ([place]) 11/17/2017  . Severe episode of recurrent major depressive disorder, without psychotic features ([place]) 11/16/2017  . Opiate abuse, continuous ([place]) 11/16/2017  . Cocaine abuse ([place]) 11/16/2017  . Suicidal ideation 11/16/2017  . Chronic pain 11/16/2017    Past Surgical History:  Procedure Laterality Date  . ABDOMINAL HYSTERECTOMY    . COLONOSCOPY N/A 11/23/2017   Procedure: COLONOSCOPY;  Surgeon: Lin Landsman, MD;  Location[facility] ENDOSCOPY;  Service: Gastroenterology;  Laterality: N/A;  . COLONOSCOPY N/A  11/24/2017   Procedure: COLONOSCOPY;  Surgeon: Lin Landsman, MD;  Location: [facility] ENDOSCOPY;  Service: Gastroenterology;  Laterality: N/A;  . KNEE SURGERY Right   . TONSILLECTOMY      Prior to Admission medications   Medication Sig Start Date End Date Taking? Authorizing Provider  gabapentin (NEURONTIN) 300 MG capsule Take 2 capsules (600 mg total) by mouth 3 (three) times daily. 12/21/18   [name], [name], MD  hydrOXYzine (ATARAX/VISTARIL) 50 MG tablet Take 1 tablet (50 mg total) by mouth 3 (three) times daily as needed for anxiety. 12/21/18   [name], [name], MD  ibuprofen (ADVIL) 800 MG tablet Take 1 tablet (800 mg total) by mouth every 8 (eight) hours as needed for moderate pain. 12/21/18   [name], [name], MD  lisinopril (ZESTRIL) 10 MG tablet Take 1 tablet (10 mg total) by mouth daily. 12/21/18   [name], [name], MD  QUEtiapine (SEROQUEL) 400 MG tablet Take 1 tablet (400 mg total) by mouth at bedtime. 12/21/18   [name], [name], MD  traZODone (DESYREL) 100 MG tablet Take 1 tablet (100 mg total) by mouth at bedtime as needed for sleep. 12/21/18   [name], [name], MD     Allergies Amoxicillin, Effexor [venlafaxine], Ketorolac, Lamotrigine, and Sulfa antibiotics  No family history on file.  Social History Social History   Tobacco Use  . Smoking status: Never Smoker  . Smokeless tobacco: Never Used  Substance Use Topics  . Alcohol use: No  . Drug use: Not Currently    Comment: crack and opiates    Review of Systems  Constitutional: No fever/chills Eyes: No visual changes.  ENT: No sore throat. Cardiovascular: Denies chest pain. Respiratory: Denies shortness of breath. Gastrointestinal: As above Genitourinary: As above Musculoskeletal: As above Skin: Negative for rash. Neurological: Negative for headaches or weakness   ____________________________________________   PHYSICAL EXAM:  VITAL SIGNS: ED Triage Vitals  Enc Vitals Group     BP 03/04/19 1922 (!) 183/120      Pulse Rate 03/04/19 1922 87     Resp 03/04/19 1922 20     Temp 03/04/19 1922 98.2 F (36.8 C)     Temp Source 03/04/19 1922 Oral     SpO2 03/04/19 1922 98 %     Weight 03/04/19 1923 79.4 kg (175 lb)     Height 03/04/19 1923 1.575 m (5\' 2" )     Head Circumference --      Peak Flow --      Pain Score 03/04/19 1923 8     Pain Loc --      Pain Edu? --      Excl. in [place]? --     Constitutional: Alert and oriented.   Nose: No congestion/rhinnorhea. Mouth/Throat: Mucous membranes are moist.    Cardiovascular: Normal rate, regular rhythm. Grossly normal heart sounds.  Good peripheral circulation. Respiratory: Normal respiratory effort.  No retractions. Lungs CTAB. Gastrointestinal: Soft and nontender. No distention.  No CVA tenderness.  Reassuring exam GU: Patient refusing pelvic exam at this time Musculoskeletal:  Warm and well perfused Neurologic:  Normal speech and language. No gross focal neurologic deficits are appreciated.  Skin:  Skin is warm, dry and intact. No rash noted. Psychiatric: Mood and affect are normal. Speech and behavior are normal.  ____________________________________________   LABS (all labs ordered are listed, but only abnormal results are displayed)  Labs Reviewed  COMPREHENSIVE METABOLIC PANEL - Abnormal; Notable for the following components:      Result Value   Glucose, Bld 112 (*)    BUN 30 (*)    Creatinine, Ser 1.05 (*)    Total Protein 9.0 (*)    All other components within normal limits  URINALYSIS, COMPLETE (UACMP) WITH MICROSCOPIC - Abnormal; Notable for the following components:   Color, Urine RED (*)    APPearance CLOUDY (*)    Glucose, UA   (*)    Value: TEST NOT REPORTED DUE TO COLOR INTERFERENCE OF URINE PIGMENT   Hgb urine dipstick   (*)    Value: TEST NOT REPORTED DUE TO COLOR INTERFERENCE OF URINE PIGMENT   Bilirubin Urine   (*)    Value: TEST NOT REPORTED DUE TO COLOR INTERFERENCE OF URINE PIGMENT   Ketones, ur   (*)    Value: TEST  NOT REPORTED DUE TO COLOR INTERFERENCE OF URINE PIGMENT   Protein, ur   (*)    Value: TEST NOT REPORTED DUE TO COLOR INTERFERENCE OF URINE PIGMENT   Nitrite   (*)    Value: TEST NOT REPORTED DUE TO COLOR INTERFERENCE OF URINE PIGMENT   Leukocytes,Ua   (*)    Value: TEST NOT REPORTED DUE TO COLOR INTERFERENCE OF URINE PIGMENT   RBC / HPF >50 (*)    All other components within normal limits  CBC   ____________________________________________  EKG   ____________________________________________  RADIOLOGY  CT renal stone study negative for kidney stone, discussed nodule that requires follow-up in 12 months patient agrees ____________________________________________   PROCEDURES  Procedure(s) performed: No  Procedures   Critical Care performed: No ____________________________________________   INITIAL IMPRESSION / ASSESSMENT AND PLAN /  ED COURSE  Pertinent labs & imaging results that were available during my care of the patient were reviewed by me and considered in my medical decision making (see chart for details).  Patient with hematuria, mild bilateral back pain.  CT is reassuring, no evidence of ureterolithiasis, treated with IM Toradol which she has tolerated in the past.   Recommended GU exam however the patient has refused, discussed with her that cannot be absolutely certain this is coming from her urethra but she feels quite confident that it is  Toradolseems to have helped the pain.  She is anxious to leave now and states that she needs to go because she is hungry. discussed with her that she will need close follow-up with urology given her hematuria and that may need cystoscopy   ____________________________________________   FINAL CLINICAL IMPRESSION(S) / ED DIAGNOSES  Final diagnoses:  Hematuria, unspecified type        Note:  This document was prepared using Dragon voice recognition software and may include unintentional dictation errors.    Lavonia Drafts, MD 03/04/19 2117

## 2019-03-04 NOTE — ED Notes (Signed)
Pt refused discharge vitals 

## 2019-03-12 ENCOUNTER — Ambulatory Visit: Payer: Self-pay | Admitting: Urology

## 2019-06-07 ENCOUNTER — Emergency Department
Admission: EM | Admit: 2019-06-07 | Discharge: 2019-06-07 | Disposition: A | Discharge status: Home / Self Care | Payer: Medicaid Other | Attending: Emergency Medicine | Admitting: Emergency Medicine

## 2019-06-07 ENCOUNTER — Emergency Department: Payer: Medicaid Other

## 2019-06-07 ENCOUNTER — Encounter: Payer: Self-pay | Admitting: Emergency Medicine

## 2019-06-07 ENCOUNTER — Other Ambulatory Visit: Payer: Self-pay

## 2019-06-07 DIAGNOSIS — M7918 Myalgia, other site: Secondary | ICD-10-CM | POA: Diagnosis present

## 2019-06-07 DIAGNOSIS — B349 Viral infection, unspecified: Secondary | ICD-10-CM | POA: Insufficient documentation

## 2019-06-07 DIAGNOSIS — I10 Essential (primary) hypertension: Secondary | ICD-10-CM | POA: Insufficient documentation

## 2019-06-07 DIAGNOSIS — J069 Acute upper respiratory infection, unspecified: Secondary | ICD-10-CM | POA: Insufficient documentation

## 2019-06-07 DIAGNOSIS — Z79899 Other long term (current) drug therapy: Secondary | ICD-10-CM | POA: Diagnosis not present

## 2019-06-07 DIAGNOSIS — J45909 Unspecified asthma, uncomplicated: Secondary | ICD-10-CM | POA: Insufficient documentation

## 2019-06-07 MED ORDER — ACETAMINOPHEN 325 MG PO TABS
650.0000 mg | ORAL_TABLET | Freq: Once | ORAL | Status: AC
  Administered 2019-06-07: 13:00:00 650 mg via ORAL
  Filled 2019-06-07: qty 2

## 2019-06-07 MED ORDER — HYDROCOD POLST-CPM POLST ER 10-8 MG/5ML PO SUER: 5.0000 mL | Freq: Two times a day (BID) | ORAL | 0 refills | Status: DC

## 2019-06-07 MED ORDER — HYDROCOD POLST-CPM POLST ER 10-8 MG/5ML PO SUER
5.0000 mL | Freq: Once | ORAL | Status: AC
  Administered 2019-06-07: 5 mL via ORAL
  Filled 2019-06-07: qty 5

## 2019-06-07 NOTE — ED Notes (Signed)
NAD noted at time of D/C. Pt denies questions or concerns. Pt ambulatory to the lobby at this time to meet her SO.

## 2019-06-07 NOTE — ED Triage Notes (Signed)
Pt to ED via [facility] from home for body aches, headache, fever, and cough. Pt states that she is so sick she cannot even stand up. Pt is currently in NAD.

## 2019-06-07 NOTE — ED Triage Notes (Signed)
FIRST NURSE NOTE- green productive cough, body aches for one week.  Did have vomiting but no more now.  Temp 99.1 with EMS

## 2019-06-07 NOTE — Discharge Instructions (Addendum)
Follow discharge care instruction take medication as prescribed.  Advised extra strength Tylenol for fever and body aches.  Continue self quarantine pending results of COVID-19 test.

## 2019-06-07 NOTE — ED Provider Notes (Signed)
[facility] Emergency Department Provider Note   ____________________________________________   First MD Initiated Contact with Patient 06/07/19 1217     (approximate)  I have reviewed the triage vital signs and the nursing notes.   HISTORY  Chief Complaint Generalized Body Aches and Cough    HPI Teresa Jefferson is a 54 y.o. female patient presents with 1 week of generalized body aches, cough, resolved nausea and vomiting, patient states she had a Covid 19 test taken 4 days ago with negative results.  Patient denies recent travel or known exposure to COVID-19.  Patient rates her body aches as a 10/10.  Patient states no palliative measure for complaint.         Past Medical History:  Diagnosis Date  . Bipolar 1 disorder ([place])   . Depression   . Hypertension   . Neuropathy   . Schizophrenia ([place])   . Thyroid disease     Patient Active Problem List   Diagnosis Date Noted  . MDD (major depressive disorder), recurrent episode, severe ([place]) 12/18/2018  . MDD (major depressive disorder), recurrent, severe, with psychosis ([place]) 12/05/2017  . Essential hypertension 11/17/2017  . Asthma 11/17/2017  . 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 Cocaine use disorder, severe, dependence ([place]) 11/17/2017  . Opioid use disorder, severe, dependence ([place]) 11/17/2017  . Severe episode of recurrent major depressive disorder, without psychotic features ([place]) 11/16/2017  . Opiate abuse, continuous ([place]) 11/16/2017  . Cocaine abuse ([place]) 11/16/2017  . Suicidal ideation 11/16/2017  . Chronic pain 11/16/2017    Past Surgical History:  Procedure Laterality Date  . ABDOMINAL HYSTERECTOMY    . COLONOSCOPY N/A 11/23/2017   Procedure: COLONOSCOPY;  Surgeon: Lin Landsman, MD;  Location: [facility] ENDOSCOPY;  Service: Gastroenterology;  Laterality: N/A;  . COLONOSCOPY N/A 11/24/2017   Procedure: COLONOSCOPY;  Surgeon: Lin Landsman, MD;  Location: [facility] ENDOSCOPY;  Service: Gastroenterology;  Laterality: N/A;  . KNEE SURGERY Right   . TONSILLECTOMY      Prior to Admission medications   Medication Sig Start Date End Date Taking? Authorizing Provider  chlorpheniramine-HYDROcodone (TUSSIONEX PENNKINETIC ER) 10-8 MG/5ML SUER Take 5 mLs by mouth 2 (two) times daily. 06/07/19   Sable Feil, PA-C  gabapentin (NEURONTIN) 300 MG capsule Take 2 capsules (600 mg total) by mouth 3 (three) times daily. 12/21/18   [name], [name], MD  hydrOXYzine (ATARAX/VISTARIL) 50 MG tablet Take 1 tablet (50 mg total) by mouth 3 (three) times daily as needed for anxiety. 12/21/18   [name], [name], MD  ibuprofen (ADVIL) 800 MG tablet Take 1 tablet (800 mg total) by mouth every 8 (eight) hours as needed for moderate pain. 12/21/18   [name], [name], MD  lisinopril (ZESTRIL) 10 MG tablet Take 1 tablet (10 mg total) by mouth daily. 12/21/18   [name], [name], MD  QUEtiapine (SEROQUEL) 400 MG tablet Take 1 tablet (400 mg total) by mouth at bedtime. 12/21/18   [name], [name], MD  traZODone (DESYREL) 100 MG tablet Take 1 tablet (100 mg total) by mouth at bedtime as needed for sleep. 12/21/18   [name], [name], MD    Allergies Amoxicillin, Effexor [venlafaxine], Ketorolac, Lamotrigine, and Sulfa antibiotics  No family history on file.  Social History Social History   Tobacco Use  . Smoking status: Never Smoker  . Smokeless tobacco: Never Used  Substance Use Topics  . Alcohol use: No  . Drug use: Not Currently  Comment: crack and opiates    Review of Systems Constitutional: No fever/chills Eyes: No visual changes. ENT: No sore throat. Cardiovascular: Denies chest pain. Respiratory: Denies shortness of breath. Gastrointestinal: No abdominal pain.  No nausea, no vomiting.  No diarrhea.  No constipation. Genitourinary: Negative for dysuria. Musculoskeletal: Negative for back pain. Skin: Negative for rash. Neurological: Negative for  headaches, focal weakness or numbness. *Psychiatric:  Depression, cocaine abuse, and opiates abuse. Endocrine:  Hypertension. Allergic/Immunilogical: Amoxil, Effexor, Toradol, and sulfur antibiotics. ____________________________________________   PHYSICAL EXAM:  VITAL SIGNS: ED Triage Vitals  Enc Vitals Group     BP 06/07/19 1205 123/81     Pulse Rate 06/07/19 1204 100     Resp 06/07/19 1204 16     Temp 06/07/19 1204 99.7 F (37.6 C)     Temp Source 06/07/19 1204 Oral     SpO2 06/07/19 1204 100 %     Weight 06/07/19 1204 170 lb (77.1 kg)     Height 06/07/19 1204 5\' 2"  (1.575 m)     Head Circumference --      Peak Flow --      Pain Score 06/07/19 1204 10     Pain Loc --      Pain Edu? --      Excl. in [place]? --    Constitutional: Alert and oriented. Well appearing and in no acute distress. Neck: No stridor.   Hematological/Lymphatic/Immunilogical: No cervical lymphadenopathy. Cardiovascular: Normal rate, regular rhythm. Grossly normal heart sounds.  Good peripheral circulation. Respiratory: Normal respiratory effort.  No retractions. Lungs CTAB. Neurologic:  Normal speech and language. No gross focal neurologic deficits are appreciated. No gait instability. Skin:  Skin is warm, dry and intact. No rash noted. Psychiatric: Mood and affect are normal. Speech and behavior are normal.  ____________________________________________   LABS (all labs ordered are listed, but only abnormal results are displayed)  Labs Reviewed - No data to display ____________________________________________  EKG   ____________________________________________  RADIOLOGY  ED MD interpretation:    Official radiology report(s): DG Chest Portable 1 View  Result Date: 06/07/2019 CLINICAL DATA:  Productive cough for 4 days EXAM: PORTABLE CHEST 1 VIEW COMPARISON:  09/14/2018 FINDINGS: Cardiomegaly. Both lungs are clear. The visualized skeletal structures are unremarkable. IMPRESSION: Cardiomegaly  without acute abnormality of the lungs in AP portable projection. Electronically Signed   By: Eddie Candle M.D.   On: 06/07/2019 12:51    ____________________________________________   PROCEDURES  Procedure(s) performed (including Critical Care):  Procedures   ____________________________________________   INITIAL IMPRESSION / ASSESSMENT AND PLAN / ED COURSE  As part of my medical decision making, I reviewed the following data within the [place]     Patient presents with 1 week of body aches, headache, fever, and cough.  Patient has a COVID-19 test pending.  Physical exam is most consistent with viral illness.  Discussed negative x-ray findings with patient.  Patient given discharge care instruction advised to continue self quarantine pending results of COVID-19 test.  Take medication as prescribed.    Teresa Jefferson was evaluated in Emergency Department on 06/07/2019 for the symptoms described in the history of present illness. She was evaluated in the context of the global COVID-19 pandemic, which necessitated consideration that the patient might be at risk for infection with the SARS-CoV-2 virus that causes COVID-19. Institutional protocols and algorithms that pertain to the evaluation of patients at risk for COVID-19 are in a state of rapid change based on information  released by regulatory bodies including the CDC and federal and state organizations. These policies and algorithms were followed during the patient's care in the ED.       ____________________________________________   FINAL CLINICAL IMPRESSION(S) / ED DIAGNOSES  Final diagnoses:  Viral URI with cough  Viral illness     ED Discharge Orders         Ordered    chlorpheniramine-HYDROcodone (TUSSIONEX PENNKINETIC ER) 10-8 MG/5ML SUER  2 times daily     06/07/19 1308           Note:  This document was prepared using Dragon voice recognition software and may include unintentional  dictation errors.    Sable Feil, PA-C 06/07/19 1311    Carrie Mew, MD 06/07/19 [phone]

## 2019-07-12 ENCOUNTER — Emergency Department
Admission: EM | Admit: 2019-07-12 | Discharge: 2019-07-13 | Disposition: A | Discharge status: Home / Self Care | Payer: Medicaid Other | Attending: Emergency Medicine | Admitting: Emergency Medicine

## 2019-07-12 ENCOUNTER — Emergency Department: Payer: Medicaid Other

## 2019-07-12 ENCOUNTER — Encounter: Payer: Self-pay | Admitting: Emergency Medicine

## 2019-07-12 ENCOUNTER — Other Ambulatory Visit: Payer: Self-pay

## 2019-07-12 DIAGNOSIS — R070 Pain in throat: Secondary | ICD-10-CM | POA: Diagnosis not present

## 2019-07-12 DIAGNOSIS — Z20822 Contact with and (suspected) exposure to covid-19: Secondary | ICD-10-CM | POA: Diagnosis not present

## 2019-07-12 DIAGNOSIS — R05 Cough: Secondary | ICD-10-CM | POA: Diagnosis present

## 2019-07-12 DIAGNOSIS — R059 Cough, unspecified: Secondary | ICD-10-CM

## 2019-07-12 DIAGNOSIS — I10 Essential (primary) hypertension: Secondary | ICD-10-CM | POA: Insufficient documentation

## 2019-07-12 DIAGNOSIS — J069 Acute upper respiratory infection, unspecified: Secondary | ICD-10-CM | POA: Diagnosis not present

## 2019-07-12 MED ORDER — KETOROLAC TROMETHAMINE 60 MG/2ML IM SOLN
60.0000 mg | Freq: Once | INTRAMUSCULAR | Status: AC
  Administered 2019-07-12: 60 mg via INTRAMUSCULAR
  Filled 2019-07-12: qty 2

## 2019-07-12 MED ORDER — DIPHENHYDRAMINE HCL 25 MG PO CAPS
25.0000 mg | ORAL_CAPSULE | Freq: Once | ORAL | Status: AC
  Administered 2019-07-12: 25 mg via ORAL
  Filled 2019-07-12: qty 1

## 2019-07-12 NOTE — ED Triage Notes (Signed)
Pt states that she has been coughing x 1 month and now her throat and ears hurt. Pt states that when she coughs she has green sputum. Pt is in NAD.

## 2019-07-12 NOTE — ED Provider Notes (Signed)
[facility] Emergency Department Provider Note       Time seen: ----------------------------------------- 11:41 PM on 07/12/2019 -----------------------------------------   I have reviewed the triage vital signs and the nursing notes.  HISTORY   Chief Complaint Cough and Sore Throat    HPI Teresa Jefferson is a 54 y.o. female with a history of bipolar disorder, depression, hypertension, schizophrenia, thyroid disease who presents to the ED for cough for the last month with sore throat and ear pain.  Patient states when she coughs she has green sputum.  Discomfort is 10 out of 10 that she describes as chronic pain.  Past Medical History:  Diagnosis Date  . Bipolar 1 disorder ([place])   . Depression   . Hypertension   . Neuropathy   . Schizophrenia ([place])   . Thyroid disease     Patient Active Problem List   Diagnosis Date Noted  . MDD (major depressive disorder), recurrent episode, severe ([place]) 12/18/2018  . MDD (major depressive disorder), recurrent, severe, with psychosis ([place]) 12/05/2017  . Essential hypertension 11/17/2017  . Asthma 11/17/2017  . 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 Cocaine use disorder, severe, dependence ([place]) 11/17/2017  . Opioid use disorder, severe, dependence ([place]) 11/17/2017  . Severe episode of recurrent major depressive disorder, without psychotic features ([place]) 11/16/2017  . Opiate abuse, continuous ([place]) 11/16/2017  . Cocaine abuse ([place]) 11/16/2017  . Suicidal ideation 11/16/2017  . Chronic pain 11/16/2017    Past Surgical History:  Procedure Laterality Date  . ABDOMINAL HYSTERECTOMY    . COLONOSCOPY N/A 11/23/2017   Procedure: COLONOSCOPY;  Surgeon: Lin Landsman, MD;  Location: [facility] ENDOSCOPY;  Service: Gastroenterology;  Laterality: N/A;  . COLONOSCOPY N/A 11/24/2017   Procedure: COLONOSCOPY;  Surgeon: Lin Landsman, MD;  Location: [facility] ENDOSCOPY;  Service:  Gastroenterology;  Laterality: N/A;  . KNEE SURGERY Right   . TONSILLECTOMY      Allergies Amoxicillin, Effexor [venlafaxine], Ketorolac, Lamotrigine, and Sulfa antibiotics  Social History Social History   Tobacco Use  . Smoking status: Never Smoker  . Smokeless tobacco: Never Used  Substance Use Topics  . Alcohol use: No  . Drug use: Not Currently    Comment: crack and opiates   Review of Systems Constitutional: Negative for fever. HEENT: Positive for ear pain and throat pain Cardiovascular: Negative for chest pain. Respiratory: Negative for shortness of breath.  Positive for cough Gastrointestinal: Negative for abdominal pain, vomiting and diarrhea. Musculoskeletal: Negative for back pain. Skin: Negative for rash. Neurological: Negative for headaches, focal weakness or numbness.  All systems negative/normal/unremarkable except as stated in the HPI  ____________________________________________   PHYSICAL EXAM:  VITAL SIGNS: ED Triage Vitals  Enc Vitals Group     BP 07/12/19 2322 (!) 180/98     Pulse Rate 07/12/19 2322 64     Resp 07/12/19 2322 17     Temp 07/12/19 2322 98.8 F (37.1 C)     Temp Source 07/12/19 2322 Oral     SpO2 07/12/19 2322 97 %     Weight 07/12/19 2323 176 lb (79.8 kg)     Height 07/12/19 2323 5\' 2"  (1.575 m)     Head Circumference --      Peak Flow --      Pain Score 07/12/19 2323 10     Pain Loc --      Pain Edu? --      Excl. in [place]? --     Constitutional: Alert  and oriented.  No acute distress Eyes: Conjunctivae are normal. Normal extraocular movements. ENT      Head: Normocephalic and atraumatic.      Nose: No congestion/rhinnorhea.      Mouth/Throat: Mucous membranes are moist.  No erythema      Neck: No stridor. Cardiovascular: Normal rate, regular rhythm. No murmurs, rubs, or gallops. Respiratory: Normal respiratory effort without tachypnea nor retractions.  Pronounced coughing is noted.  Breath sounds are clear and equal  bilaterally. No wheezes/rales/rhonchi. Gastrointestinal: Soft and nontender. Normal bowel sounds Musculoskeletal: Nontender with normal range of motion in extremities. No lower extremity tenderness nor edema. Neurologic:  Normal speech and language. No gross focal neurologic deficits are appreciated.  Skin:  Skin is warm, dry and intact. No rash noted. Psychiatric: Mood and affect are normal. Speech and behavior are normal.  ____________________________________________  ED COURSE:  As part of my medical decision making, I reviewed the following data within the [place] History obtained from family if available, nursing notes, old chart and ekg, as well as notes from prior ED visits. Patient presented for respiratory symptoms, we will assess with labs and imaging as indicated at this time.   Procedures  MILAHN INWOOD was evaluated in Emergency Department on 07/12/2019 for the symptoms described in the history of present illness. She was evaluated in the context of the global COVID-19 pandemic, which necessitated consideration that the patient might be at risk for infection with the SARS-CoV-2 virus that causes COVID-19. Institutional protocols and algorithms that pertain to the evaluation of patients at risk for COVID-19 are in a state of rapid change based on information released by regulatory bodies including the CDC and federal and state organizations. These policies and algorithms were followed during the patient's care in the ED.  ____________________________________________   LABS (pertinent positives/negatives)  Labs Reviewed  SARS CORONAVIRUS 2 (TAT 6-24 HRS)    RADIOLOGY  Chest x-ray IMPRESSION:  No active disease.  ____________________________________________   DIFFERENTIAL DIAGNOSIS   URI, depression, substance abuse, COVID-19  FINAL ASSESSMENT AND PLAN  URI   Plan: The patient had presented for persistent cough. Patient's labs are still pending,  we did send a 24-hour Covid test. Patient's imaging was reassuring, no signs of pneumonia.  She be discharged with antibiotics and Tessalon.   Laurence Aly, MD    Note: This note was generated in part or whole with voice recognition software. Voice recognition is usually quite accurate but there are transcription errors that can and very often do occur. I apologize for any typographical errors that were not detected and corrected.     Earleen Newport, MD 07/13/19 [phone]

## 2019-07-13 LAB — SARS CORONAVIRUS 2 (TAT 6-24 HRS): SARS Coronavirus 2: NEGATIVE

## 2019-07-13 MED ORDER — BENZONATATE 100 MG PO CAPS
200.0000 mg | ORAL_CAPSULE | Freq: Once | ORAL | Status: AC
  Administered 2019-07-13: 02:00:00 200 mg via ORAL
  Filled 2019-07-13: qty 2

## 2019-07-13 MED ORDER — BENZONATATE 200 MG PO CAPS: 200.0000 mg | ORAL_CAPSULE | Freq: Three times a day (TID) | ORAL | 0 refills | Status: DC | PRN

## 2019-07-13 MED ORDER — AZITHROMYCIN 250 MG PO TABS: ORAL_TABLET | ORAL | 0 refills | Status: DC

## 2019-07-13 NOTE — ED Notes (Signed)
EDP at bedside with this RN for assessment

## 2019-07-13 NOTE — ED Notes (Signed)
Pt's husband called and pt was given the phone at this time

## 2019-07-13 NOTE — ED Notes (Signed)
Pt's husband called and pt was given a phone

## 2019-07-13 NOTE — ED Notes (Signed)
Pt signed a hard copy of the E-signature page and it was placed for medical records pick up

## 2019-07-13 NOTE — ED Notes (Signed)
Pt given phone, warm blanket, ice and soda.

## 2019-07-13 NOTE — ED Notes (Signed)
Pt was given a coke per request

## 2019-10-13 ENCOUNTER — Telehealth: Payer: Self-pay | Admitting: Emergency Medicine

## 2019-10-13 ENCOUNTER — Other Ambulatory Visit: Payer: Self-pay

## 2019-10-13 ENCOUNTER — Emergency Department: Payer: Medicaid Other

## 2019-10-13 ENCOUNTER — Emergency Department
Admission: EM | Admit: 2019-10-13 | Discharge: 2019-10-13 | Disposition: A | Discharge status: Home / Self Care | Payer: Medicaid Other | Attending: Emergency Medicine | Admitting: Emergency Medicine

## 2019-10-13 DIAGNOSIS — L03211 Cellulitis of face: Secondary | ICD-10-CM | POA: Insufficient documentation

## 2019-10-13 DIAGNOSIS — I10 Essential (primary) hypertension: Secondary | ICD-10-CM | POA: Insufficient documentation

## 2019-10-13 DIAGNOSIS — R220 Localized swelling, mass and lump, head: Secondary | ICD-10-CM | POA: Diagnosis present

## 2019-10-13 DIAGNOSIS — Z79899 Other long term (current) drug therapy: Secondary | ICD-10-CM | POA: Insufficient documentation

## 2019-10-13 LAB — COMPREHENSIVE METABOLIC PANEL
ALT: 13 U/L (ref 0–44)
AST: 17 U/L (ref 15–41)
Albumin: 3.5 g/dL (ref 3.5–5.0)
Alkaline Phosphatase: 61 U/L (ref 38–126)
Anion gap: 8 (ref 5–15)
BUN: 16 mg/dL (ref 6–20)
CO2: 20 mmol/L — ABNORMAL LOW (ref 22–32)
Calcium: 8.4 mg/dL — ABNORMAL LOW (ref 8.9–10.3)
Chloride: 102 mmol/L (ref 98–111)
Creatinine, Ser: 1.06 mg/dL — ABNORMAL HIGH (ref 0.44–1.00)
GFR calc Af Amer: >60 mL/min (ref >60)
GFR calc non Af Amer: 60 mL/min — ABNORMAL LOW (ref >60)
Glucose, Bld: 249 mg/dL — ABNORMAL HIGH (ref 70–99)
Potassium: 3.2 mmol/L — ABNORMAL LOW (ref 3.5–5.1)
Sodium: 130 mmol/L — ABNORMAL LOW (ref 135–145)
Total Bilirubin: 0.6 mg/dL (ref 0.3–1.2)
Total Protein: 7.8 g/dL (ref 6.5–8.1)

## 2019-10-13 LAB — CBC WITH DIFFERENTIAL/PLATELET
Abs Immature Granulocytes: 0.11 10*3/uL — ABNORMAL HIGH (ref 0.00–0.07)
Basophils Absolute: 0 10*3/uL (ref 0.0–0.1)
Basophils Relative: 0 %
Eosinophils Absolute: 0 10*3/uL (ref 0.0–0.5)
Eosinophils Relative: 0 %
HCT: 35.2 % — ABNORMAL LOW (ref 36.0–46.0)
Hemoglobin: 11.9 g/dL — ABNORMAL LOW (ref 12.0–15.0)
Immature Granulocytes: 1 %
Lymphocytes Relative: 5 %
Lymphs Abs: 0.6 10*3/uL — ABNORMAL LOW (ref 0.7–4.0)
MCH: 26.7 pg (ref 26.0–34.0)
MCHC: 33.8 g/dL (ref 30.0–36.0)
MCV: 79.1 fL — ABNORMAL LOW (ref 80.0–100.0)
Monocytes Absolute: 0.6 10*3/uL (ref 0.1–1.0)
Monocytes Relative: 5 %
Neutro Abs: 11.1 10*3/uL — ABNORMAL HIGH (ref 1.7–7.7)
Neutrophils Relative %: 89 %
Platelets: 223 10*3/uL (ref 150–400)
RBC: 4.45 MIL/uL (ref 3.87–5.11)
RDW: 15.4 % (ref 11.5–15.5)
WBC: 12.5 10*3/uL — ABNORMAL HIGH (ref 4.0–10.5)
nRBC: 0 % (ref 0.0–0.2)

## 2019-10-13 LAB — LACTIC ACID, PLASMA: Lactic Acid, Venous: 1.4 mmol/L (ref 0.5–1.9)

## 2019-10-13 MED ORDER — CLINDAMYCIN HCL 300 MG PO CAPS: 300.0000 mg | ORAL_CAPSULE | Freq: Three times a day (TID) | ORAL | 0 refills | Status: DC

## 2019-10-13 MED ORDER — FENTANYL CITRATE (PF) 100 MCG/2ML IJ SOLN
100.0000 ug | Freq: Once | INTRAMUSCULAR | Status: AC
  Administered 2019-10-13: 100 ug via INTRAVENOUS
  Filled 2019-10-13: qty 2

## 2019-10-13 MED ORDER — MORPHINE SULFATE (PF) 4 MG/ML IV SOLN
4.0000 mg | Freq: Once | INTRAVENOUS | Status: AC
  Administered 2019-10-13: 4 mg via INTRAVENOUS
  Filled 2019-10-13: qty 1

## 2019-10-13 MED ORDER — SODIUM CHLORIDE 0.9 % IV BOLUS
1000.0000 mL | Freq: Once | INTRAVENOUS | Status: AC
  Administered 2019-10-13: 1000 mL via INTRAVENOUS

## 2019-10-13 MED ORDER — CLINDAMYCIN HCL 300 MG PO CAPS
300.0000 mg | ORAL_CAPSULE | Freq: Three times a day (TID) | ORAL | 0 refills | Status: DC
Start: 2019-10-13 — End: 2019-10-13

## 2019-10-13 MED ORDER — SODIUM CHLORIDE 0.9 % IV SOLN
1.0000 g | Freq: Once | INTRAVENOUS | Status: AC
  Administered 2019-10-13: 1 g via INTRAVENOUS
  Filled 2019-10-13: qty 10

## 2019-10-13 MED ORDER — POTASSIUM CHLORIDE CRYS ER 20 MEQ PO TBCR
20.0000 meq | EXTENDED_RELEASE_TABLET | Freq: Once | ORAL | Status: AC
  Administered 2019-10-13: 20 meq via ORAL
  Filled 2019-10-13: qty 1

## 2019-10-13 MED ORDER — IOHEXOL 300 MG/ML  SOLN
75.0000 mL | Freq: Once | INTRAMUSCULAR | Status: AC | PRN
  Administered 2019-10-13: 75 mL via INTRAVENOUS
  Filled 2019-10-13: qty 75

## 2019-10-13 MED ORDER — ONDANSETRON HCL 4 MG/2ML IJ SOLN
4.0000 mg | Freq: Once | INTRAMUSCULAR | Status: AC
  Administered 2019-10-13: 4 mg via INTRAVENOUS
  Filled 2019-10-13: qty 2

## 2019-10-13 MED ORDER — OXYCODONE-ACETAMINOPHEN 5-325 MG PO TABS: 1.0000 | ORAL_TABLET | Freq: Four times a day (QID) | ORAL | 0 refills | Status: DC | PRN

## 2019-10-13 MED ORDER — OXYCODONE-ACETAMINOPHEN 5-325 MG PO TABS: 1.0000 | ORAL_TABLET | Freq: Three times a day (TID) | ORAL | 0 refills | Status: DC | PRN

## 2019-10-13 NOTE — ED Triage Notes (Signed)
Pt with R sided facial swelling x 2 days. States fever at home. Took ibuprofen at 6am. Airway clear, no swelling to tonsils. Face appears red. A&O. In wheelchair.

## 2019-10-13 NOTE — ED Provider Notes (Signed)
[facility] Emergency Department Provider Note ____________________________________________   First MD Initiated Contact with Patient 10/13/19 1330     (approximate)  I have reviewed the triage vital signs and the nursing notes.   HISTORY  Chief Complaint Facial Swelling  HPI Teresa Jefferson is a 54 y.o. female who presents to the emergency department for facial pain and swelling. She noticed that her forehead was red and felt slightly swollen yesterday. Through the night and today she noticed increase in swelling that is now around her right eye and moving toward her right ear. She believes she has had a fever. No injury. No nausea or vomiting.         Past Medical History:  Diagnosis Date  . Bipolar 1 disorder ([place])   . Depression   . Hypertension   . Neuropathy   . Schizophrenia ([place])   . Thyroid disease     Patient Active Problem List   Diagnosis Date Noted  . MDD (major depressive disorder), recurrent episode, severe ([place]) 12/18/2018  . MDD (major depressive disorder), recurrent, severe, with psychosis ([place]) 12/05/2017  . Essential hypertension 11/17/2017  . Asthma 11/17/2017  . 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 Cocaine use disorder, severe, dependence ([place]) 11/17/2017  . Opioid use disorder, severe, dependence ([place]) 11/17/2017  . Severe episode of recurrent major depressive disorder, without psychotic features ([place]) 11/16/2017  . Opiate abuse, continuous ([place]) 11/16/2017  . Cocaine abuse ([place]) 11/16/2017  . Suicidal ideation 11/16/2017  . Chronic pain 11/16/2017    Past Surgical History:  Procedure Laterality Date  . ABDOMINAL HYSTERECTOMY    . COLONOSCOPY N/A 11/23/2017   Procedure: COLONOSCOPY;  Surgeon: Lin Landsman, MD;  Location: [facility] ENDOSCOPY;  Service: Gastroenterology;  Laterality: N/A;  . COLONOSCOPY N/A 11/24/2017   Procedure: COLONOSCOPY;  Surgeon: Lin Landsman, MD;   Location: [facility] ENDOSCOPY;  Service: Gastroenterology;  Laterality: N/A;  . KNEE SURGERY Right   . TONSILLECTOMY      Prior to Admission medications   Medication Sig Start Date End Date Taking? Authorizing Provider  azithromycin (ZITHROMAX Z-PAK) 250 MG tablet Take 2 tablets (500 mg) on  Day 1,  followed by 1 tablet (250 mg) once daily on Days 2 through 5. 07/13/19   Earleen Newport, MD  benzonatate (TESSALON) 200 MG capsule Take 1 capsule (200 mg total) by mouth 3 (three) times daily as needed for cough. 07/13/19   Earleen Newport, MD  chlorpheniramine-HYDROcodone (TUSSIONEX PENNKINETIC ER) 10-8 MG/5ML SUER Take 5 mLs by mouth 2 (two) times daily. 06/07/19   Sable Feil, PA-C  clindamycin (CLEOCIN) 300 MG capsule Take 1 capsule (300 mg total) by mouth 3 (three) times daily for 10 days. 10/13/19 10/23/19  Triplett, Johnette Abraham B, FNP  gabapentin (NEURONTIN) 300 MG capsule Take 2 capsules (600 mg total) by mouth 3 (three) times daily. 12/21/18   [name], [name], MD  hydrOXYzine (ATARAX/VISTARIL) 50 MG tablet Take 1 tablet (50 mg total) by mouth 3 (three) times daily as needed for anxiety. 12/21/18   [name], [name], MD  ibuprofen (ADVIL) 800 MG tablet Take 1 tablet (800 mg total) by mouth every 8 (eight) hours as needed for moderate pain. 12/21/18   [name], [name], MD  lisinopril (ZESTRIL) 10 MG tablet Take 1 tablet (10 mg total) by mouth daily. 12/21/18   [name], [name], MD  oxyCODONE-acetaminophen (PERCOCET) 5-325 MG tablet Take 1 tablet by mouth every 8 (eight) hours  as needed. 10/13/19 10/12/20  Triplett, Johnette Abraham B, FNP  QUEtiapine (SEROQUEL) 400 MG tablet Take 1 tablet (400 mg total) by mouth at bedtime. 12/21/18   [name], [name], MD  traZODone (DESYREL) 100 MG tablet Take 1 tablet (100 mg total) by mouth at bedtime as needed for sleep. 12/21/18   [name], [name], MD    Allergies Amoxicillin, Effexor [venlafaxine], Ketorolac, Lamotrigine, and Sulfa antibiotics  History reviewed. No pertinent family  history.  Social History Social History   Tobacco Use  . Smoking status: Never Smoker  . Smokeless tobacco: Never Used  Substance Use Topics  . Alcohol use: No  . Drug use: Not Currently    Comment: crack and opiates    Review of Systems  Constitutional: Positive for fever/chills Eyes: No visual changes. ENT: No sore throat. Cardiovascular: Denies chest pain. Respiratory: Denies shortness of breath. Gastrointestinal: No abdominal pain.  No nausea, no vomiting.  No diarrhea.  No constipation. Genitourinary: Negative for dysuria. Musculoskeletal: Negative for back pain. Skin: Positive for facial erythema. Neurological: Negative for headaches, focal weakness or numbness. ____________________________________________   PHYSICAL EXAM:  VITAL SIGNS: ED Triage Vitals  Enc Vitals Group     BP 10/13/19 1253 104/65     Pulse Rate 10/13/19 1253 (!) 110     Resp 10/13/19 1253 16     Temp 10/13/19 1253 99.1 F (37.3 C)     Temp Source 10/13/19 1253 Oral     SpO2 10/13/19 1253 96 %     Weight 10/13/19 1254 176 lb (79.8 kg)     Height 10/13/19 1254 5\' 2"  (1.575 m)     Head Circumference --      Peak Flow --      Pain Score 10/13/19 1253 10     Pain Loc --      Pain Edu? --      Excl. in [place]? --     Constitutional: Alert and oriented. Acutely ill appearing and in no acute distress. Eyes: Conjunctivae are normal. PERRL. EOMI. Head: Atraumatic. Nose: No congestion/rhinnorhea. Mouth/Throat: Mucous membranes are moist.  Oropharynx non-erythematous. Neck: No stridor.   Hematological/Lymphatic/Immunilogical: No cervical lymphadenopathy. Cardiovascular: Normal rate, regular rhythm. Grossly normal heart sounds.  Good peripheral circulation. Respiratory: Normal respiratory effort.  No retractions. Lungs CTAB. Gastrointestinal: Soft and nontender. No distention. No abdominal bruits. No CVA tenderness. Genitourinary:  Musculoskeletal: No lower extremity tenderness nor edema.  No  joint effusions. Neurologic:  Normal speech and language. No gross focal neurologic deficits are appreciated. No gait instability. Skin:  Right side facial erythema and periorbital erythema extending toward right ear. Psychiatric: Mood and affect are normal. Speech and behavior are normal.  ____________________________________________   LABS (all labs ordered are listed, but only abnormal results are displayed)  Labs Reviewed  COMPREHENSIVE METABOLIC PANEL - Abnormal; Notable for the following components:      Result Value   Sodium 130 (*)    Potassium 3.2 (*)    CO2 20 (*)    Glucose, Bld 249 (*)    Creatinine, Ser 1.06 (*)    Calcium 8.4 (*)    GFR calc non Af Amer 60 (*)    All other components within normal limits  CBC WITH DIFFERENTIAL/PLATELET - Abnormal; Notable for the following components:   WBC 12.5 (*)    Hemoglobin 11.9 (*)    HCT 35.2 (*)    MCV 79.1 (*)    Neutro Abs 11.1 (*)    Lymphs Abs 0.6 (*)  Abs Immature Granulocytes 0.11 (*)    All other components within normal limits  CULTURE, BLOOD (ROUTINE X 2)  CULTURE, BLOOD (ROUTINE X 2)  LACTIC ACID, PLASMA   ____________________________________________  EKG  Not indicated. ____________________________________________  RADIOLOGY  ED MD interpretation:    CT shows facial subcutaneous edema on the right. No postseptal involvement. No obvious mastoiditis.   Official radiology report(s): CT Soft Tissue Neck W Contrast  Result Date: 10/13/2019 CLINICAL DATA:  Right-sided facial swelling EXAM: CT NECK WITH CONTRAST TECHNIQUE: Multidetector CT imaging of the neck was performed using the standard protocol following the bolus administration of intravenous contrast. Multi detector CT imaging of the orbits was performed using the standard protocol following the bolus administration of intravenous contrast. CONTRAST:  29mL OMNIPAQUE IOHEXOL 300 MG/ML  SOLN COMPARISON:  None. FINDINGS: Motion artifact is present on  the neck portion. Pharynx and larynx: Grossly unremarkable.  No mass or swelling. Salivary glands: Grossly unremarkable. Thyroid: Unremarkable. Lymph nodes: No enlarged nodes. Vascular: Major neck vessels appear patent where adequately visualized Limited intracranial: No abnormal enhancement. Orbits: No postseptal involvement. There is no significant intraorbital abnormality. Mastoids and visualized paranasal sinuses: Minor mucosal thickening. Left mastoid air cells are aerated. Opacification of underpneumatized right mastoid. Skeleton: Unremarkable. Upper chest: No apical lung mass. Other: Facial subcutaneous edema, greater on the right. IMPRESSION: CT neck portion is degraded by motion. Facial subcutaneous edema, greater on the right. No evidence of abscess. No intraorbital extension. Electronically Signed   By: Macy Mis M.D.   On: 10/13/2019 15:05   CT ORBITS W CONTRAST  Result Date: 10/13/2019 CLINICAL DATA:  Right-sided facial swelling EXAM: CT NECK WITH CONTRAST TECHNIQUE: Multidetector CT imaging of the neck was performed using the standard protocol following the bolus administration of intravenous contrast. Multi detector CT imaging of the orbits was performed using the standard protocol following the bolus administration of intravenous contrast. CONTRAST:  58mL OMNIPAQUE IOHEXOL 300 MG/ML  SOLN COMPARISON:  None. FINDINGS: Motion artifact is present on the neck portion. Pharynx and larynx: Grossly unremarkable.  No mass or swelling. Salivary glands: Grossly unremarkable. Thyroid: Unremarkable. Lymph nodes: No enlarged nodes. Vascular: Major neck vessels appear patent where adequately visualized Limited intracranial: No abnormal enhancement. Orbits: No postseptal involvement. There is no significant intraorbital abnormality. Mastoids and visualized paranasal sinuses: Minor mucosal thickening. Left mastoid air cells are aerated. Opacification of underpneumatized right mastoid. Skeleton:  Unremarkable. Upper chest: No apical lung mass. Other: Facial subcutaneous edema, greater on the right. IMPRESSION: CT neck portion is degraded by motion. Facial subcutaneous edema, greater on the right. No evidence of abscess. No intraorbital extension. Electronically Signed   By: Macy Mis M.D.   On: 10/13/2019 15:05    ____________________________________________   PROCEDURES  Procedure(s) performed (including Critical Care):  Procedures  ____________________________________________   INITIAL IMPRESSION / ASSESSMENT AND PLAN     54 year old female presenting to the emergency department for treatment and evaluation of forehead swelling that has progressed around her right.  Subjective fever.  See HPI for further details.  Plan will be to get a CT of the face and soft tissue neck to evaluate for cellulitis.  DIFFERENTIAL DIAGNOSIS  Preseptal cellulitis, orbital cellulitis, mastoiditis, generalized facial cellulitis.  ED COURSE  While here, the patient was given IV Rocephin.  Labs show mild hyponatremia.  She was given IVF normal saline.  Potassium was also down to 3.2.  She was given a p.o. supplement.  CT shows a subcutaneous cellulitis that does  not involve the orbit or mastoid.  She will be discharged home with prescriptions for clindamycin and Percocet.  Patient called back and advised that she is unable to get her prescriptions filled anywhere but [facility].  It is currently closed.  [facility] was notified to cancel the prescriptions submitted via fax.  Prescriptions were resubmitted to [facility].  Patient was notified. ____________________________________________   FINAL CLINICAL IMPRESSION(S) / ED DIAGNOSES  Final diagnoses:  Facial cellulitis     ED Discharge Orders         Ordered    clindamycin (CLEOCIN) 300 MG capsule  3 times daily     10/13/19 1602    oxyCODONE-acetaminophen (PERCOCET) 5-325 MG tablet  Every 8 hours PRN     10/13/19 1602            Teresa Jefferson was evaluated in Emergency Department on 10/13/2019 for the symptoms described in the history of present illness. She was evaluated in the context of the global COVID-19 pandemic, which necessitated consideration that the patient might be at risk for infection with the SARS-CoV-2 virus that causes COVID-19. Institutional protocols and algorithms that pertain to the evaluation of patients at risk for COVID-19 are in a state of rapid change based on information released by regulatory bodies including the CDC and federal and state organizations. These policies and algorithms were followed during the patient's care in the ED.   Note:  This document was prepared using Dragon voice recognition software and may include unintentional dictation errors.   Victorino Dike, FNP 10/13/19 Ward Chatters    Blake Divine, MD 10/14/19 2049

## 2019-10-13 NOTE — Discharge Instructions (Signed)
Please call and schedule follow-up appointment with your primary care provider.  Take the antibiotics as prescribed and until finished.  Take the Percocet sparingly.  Be advised that the Percocet may cause an increase in drowsiness when combined with Suboxone.  Return to the emergency department for symptoms of change or worsen or for new symptoms of concern if you are unable to see primary care right away.

## 2019-10-13 NOTE — Telephone Encounter (Signed)
Pt requested prescriptions to be sent to [facility] due to being "locked in" for controlled substances. Spoke with [employer], who will be sending the prescriptions to [facility].  I spoke with [facility] at [facility] and they canceled the RX for percocet and clindamycin.  I called patient to let her know the prescriptions will be ready at [facility] tomorrow.

## 2019-10-15 ENCOUNTER — Encounter: Payer: Self-pay | Admitting: Emergency Medicine

## 2019-10-15 ENCOUNTER — Inpatient Hospital Stay
Admission: EM | Admit: 2019-10-15 | Discharge: 2019-10-17 | DRG: 603 | Disposition: A | Discharge status: Home / Self Care | Payer: Medicaid Other | Attending: Internal Medicine | Admitting: Internal Medicine

## 2019-10-15 ENCOUNTER — Other Ambulatory Visit: Payer: Self-pay

## 2019-10-15 DIAGNOSIS — H538 Other visual disturbances: Secondary | ICD-10-CM | POA: Diagnosis present

## 2019-10-15 DIAGNOSIS — Z880 Allergy status to penicillin: Secondary | ICD-10-CM | POA: Diagnosis not present

## 2019-10-15 DIAGNOSIS — J45909 Unspecified asthma, uncomplicated: Secondary | ICD-10-CM | POA: Diagnosis present

## 2019-10-15 DIAGNOSIS — Z20822 Contact with and (suspected) exposure to covid-19: Secondary | ICD-10-CM | POA: Diagnosis present

## 2019-10-15 DIAGNOSIS — Z882 Allergy status to sulfonamides status: Secondary | ICD-10-CM | POA: Diagnosis not present

## 2019-10-15 DIAGNOSIS — Z886 Allergy status to analgesic agent status: Secondary | ICD-10-CM | POA: Diagnosis not present

## 2019-10-15 DIAGNOSIS — Z79899 Other long term (current) drug therapy: Secondary | ICD-10-CM | POA: Diagnosis not present

## 2019-10-15 DIAGNOSIS — F1110 Opioid abuse, uncomplicated: Secondary | ICD-10-CM | POA: Diagnosis present

## 2019-10-15 DIAGNOSIS — F209 Schizophrenia, unspecified: Secondary | ICD-10-CM | POA: Diagnosis present

## 2019-10-15 DIAGNOSIS — I10 Essential (primary) hypertension: Secondary | ICD-10-CM | POA: Diagnosis present

## 2019-10-15 DIAGNOSIS — Z888 Allergy status to other drugs, medicaments and biological substances status: Secondary | ICD-10-CM

## 2019-10-15 DIAGNOSIS — F319 Bipolar disorder, unspecified: Secondary | ICD-10-CM | POA: Diagnosis present

## 2019-10-15 DIAGNOSIS — T43621A Poisoning by amphetamines, accidental (unintentional), initial encounter: Secondary | ICD-10-CM | POA: Diagnosis not present

## 2019-10-15 DIAGNOSIS — E876 Hypokalemia: Secondary | ICD-10-CM | POA: Diagnosis present

## 2019-10-15 DIAGNOSIS — F1120 Opioid dependence, uncomplicated: Secondary | ICD-10-CM

## 2019-10-15 DIAGNOSIS — L03211 Cellulitis of face: Principal | ICD-10-CM | POA: Diagnosis present

## 2019-10-15 DIAGNOSIS — H05011 Cellulitis of right orbit: Secondary | ICD-10-CM | POA: Diagnosis present

## 2019-10-15 DIAGNOSIS — G629 Polyneuropathy, unspecified: Secondary | ICD-10-CM | POA: Diagnosis present

## 2019-10-15 DIAGNOSIS — R229 Localized swelling, mass and lump, unspecified: Secondary | ICD-10-CM | POA: Diagnosis present

## 2019-10-15 DIAGNOSIS — Z789 Other specified health status: Secondary | ICD-10-CM

## 2019-10-15 DIAGNOSIS — A46 Erysipelas: Secondary | ICD-10-CM

## 2019-10-15 HISTORY — DX: Unspecified asthma, uncomplicated: J45.909

## 2019-10-15 LAB — CBC WITH DIFFERENTIAL/PLATELET
Abs Immature Granulocytes: 0.04 10*3/uL (ref 0.00–0.07)
Basophils Absolute: 0 10*3/uL (ref 0.0–0.1)
Basophils Relative: 0 %
Eosinophils Absolute: 0.2 10*3/uL (ref 0.0–0.5)
Eosinophils Relative: 2 %
HCT: 33.2 % — ABNORMAL LOW (ref 36.0–46.0)
Hemoglobin: 10.6 g/dL — ABNORMAL LOW (ref 12.0–15.0)
Immature Granulocytes: 0 %
Lymphocytes Relative: 10 %
Lymphs Abs: 1.1 10*3/uL (ref 0.7–4.0)
MCH: 26.2 pg (ref 26.0–34.0)
MCHC: 31.9 g/dL (ref 30.0–36.0)
MCV: 82 fL (ref 80.0–100.0)
Monocytes Absolute: 0.6 10*3/uL (ref 0.1–1.0)
Monocytes Relative: 6 %
Neutro Abs: 8.9 10*3/uL — ABNORMAL HIGH (ref 1.7–7.7)
Neutrophils Relative %: 82 %
Platelets: 228 10*3/uL (ref 150–400)
RBC: 4.05 MIL/uL (ref 3.87–5.11)
RDW: 15.9 % — ABNORMAL HIGH (ref 11.5–15.5)
WBC: 10.9 10*3/uL — ABNORMAL HIGH (ref 4.0–10.5)
nRBC: 0 % (ref 0.0–0.2)

## 2019-10-15 LAB — COMPREHENSIVE METABOLIC PANEL
ALT: 36 U/L (ref 0–44)
AST: 29 U/L (ref 15–41)
Albumin: 3.3 g/dL — ABNORMAL LOW (ref 3.5–5.0)
Alkaline Phosphatase: 100 U/L (ref 38–126)
Anion gap: 8 (ref 5–15)
BUN: 26 mg/dL — ABNORMAL HIGH (ref 6–20)
CO2: 19 mmol/L — ABNORMAL LOW (ref 22–32)
Calcium: 8.7 mg/dL — ABNORMAL LOW (ref 8.9–10.3)
Chloride: 110 mmol/L (ref 98–111)
Creatinine, Ser: 1.37 mg/dL — ABNORMAL HIGH (ref 0.44–1.00)
GFR calc Af Amer: 51 mL/min — ABNORMAL LOW (ref >60)
GFR calc non Af Amer: 44 mL/min — ABNORMAL LOW (ref >60)
Glucose, Bld: 102 mg/dL — ABNORMAL HIGH (ref 70–99)
Potassium: 3.2 mmol/L — ABNORMAL LOW (ref 3.5–5.1)
Sodium: 137 mmol/L (ref 135–145)
Total Bilirubin: 0.5 mg/dL (ref 0.3–1.2)
Total Protein: 7.8 g/dL (ref 6.5–8.1)

## 2019-10-15 LAB — LACTIC ACID, PLASMA
Lactic Acid, Venous: 0.6 mmol/L (ref 0.5–1.9)
Lactic Acid, Venous: 0.7 mmol/L (ref 0.5–1.9)

## 2019-10-15 LAB — HIV ANTIBODY (ROUTINE TESTING W REFLEX): HIV Screen 4th Generation wRfx: NONREACTIVE

## 2019-10-15 LAB — SARS CORONAVIRUS 2 BY RT PCR (HOSPITAL ORDER, PERFORMED IN ~~LOC~~ HOSPITAL LAB): SARS Coronavirus 2: NEGATIVE

## 2019-10-15 MED ORDER — GABAPENTIN 600 MG PO TABS
600.0000 mg | ORAL_TABLET | Freq: Three times a day (TID) | ORAL | Status: DC
  Administered 2019-10-15 – 2019-10-17 (×7): 600 mg via ORAL
  Filled 2019-10-15 (×7): qty 1

## 2019-10-15 MED ORDER — KETAMINE HCL 10 MG/ML IJ SOLN
0.3000 mg/kg | Freq: Once | INTRAMUSCULAR | Status: AC
  Administered 2019-10-15: 24 mg via INTRAVENOUS
  Filled 2019-10-15: qty 1

## 2019-10-15 MED ORDER — QUETIAPINE FUMARATE 200 MG PO TABS
400.0000 mg | ORAL_TABLET | Freq: Every day | ORAL | Status: DC
  Administered 2019-10-15 – 2019-10-16 (×2): 400 mg via ORAL
  Filled 2019-10-15 (×3): qty 2

## 2019-10-15 MED ORDER — OXYCODONE-ACETAMINOPHEN 5-325 MG PO TABS
1.0000 | ORAL_TABLET | Freq: Four times a day (QID) | ORAL | Status: DC | PRN
  Administered 2019-10-15 – 2019-10-16 (×3): 1 via ORAL
  Filled 2019-10-15 (×3): qty 1

## 2019-10-15 MED ORDER — LISINOPRIL 10 MG PO TABS
10.0000 mg | ORAL_TABLET | Freq: Every day | ORAL | Status: DC
  Administered 2019-10-15 – 2019-10-17 (×3): 10 mg via ORAL
  Filled 2019-10-15 (×3): qty 1

## 2019-10-15 MED ORDER — POTASSIUM CHLORIDE CRYS ER 20 MEQ PO TBCR
40.0000 meq | EXTENDED_RELEASE_TABLET | Freq: Once | ORAL | Status: AC
  Administered 2019-10-15: 40 meq via ORAL
  Filled 2019-10-15: qty 2

## 2019-10-15 MED ORDER — DIPHENHYDRAMINE HCL 50 MG/ML IJ SOLN
25.0000 mg | Freq: Once | INTRAMUSCULAR | Status: AC
  Administered 2019-10-15: 25 mg via INTRAVENOUS
  Filled 2019-10-15: qty 1

## 2019-10-15 MED ORDER — ONDANSETRON HCL 4 MG PO TABS: 4.0000 mg | ORAL_TABLET | Freq: Four times a day (QID) | ORAL | Status: DC | PRN

## 2019-10-15 MED ORDER — TRAZODONE HCL 100 MG PO TABS: 100.0000 mg | ORAL_TABLET | Freq: Every evening | ORAL | Status: DC | PRN

## 2019-10-15 MED ORDER — ONDANSETRON HCL 4 MG/2ML IJ SOLN: 4.0000 mg | Freq: Four times a day (QID) | INTRAMUSCULAR | Status: DC | PRN

## 2019-10-15 MED ORDER — CEFAZOLIN SODIUM-DEXTROSE 1-4 GM/50ML-% IV SOLN
1.0000 g | Freq: Three times a day (TID) | INTRAVENOUS | Status: DC
  Administered 2019-10-15: 1 g via INTRAVENOUS
  Filled 2019-10-15 (×4): qty 50

## 2019-10-15 MED ORDER — IBUPROFEN 400 MG PO TABS
400.0000 mg | ORAL_TABLET | ORAL | Status: DC | PRN
  Administered 2019-10-15 – 2019-10-16 (×2): 400 mg via ORAL
  Filled 2019-10-15 (×2): qty 1

## 2019-10-15 MED ORDER — SODIUM CHLORIDE 0.9 % IV SOLN: INTRAVENOUS | Status: DC

## 2019-10-15 MED ORDER — ENOXAPARIN SODIUM 40 MG/0.4ML ~~LOC~~ SOLN
40.0000 mg | SUBCUTANEOUS | Status: DC
  Administered 2019-10-15 – 2019-10-17 (×3): 40 mg via SUBCUTANEOUS
  Filled 2019-10-15 (×3): qty 0.4

## 2019-10-15 MED ORDER — CLINDAMYCIN PHOSPHATE 600 MG/50ML IV SOLN
600.0000 mg | Freq: Three times a day (TID) | INTRAVENOUS | Status: DC
  Administered 2019-10-15 – 2019-10-17 (×7): 600 mg via INTRAVENOUS
  Filled 2019-10-15 (×12): qty 50

## 2019-10-15 MED ORDER — VANCOMYCIN HCL 1500 MG/300ML IV SOLN
1500.0000 mg | Freq: Once | INTRAVENOUS | Status: AC
  Administered 2019-10-15: 1500 mg via INTRAVENOUS
  Filled 2019-10-15: qty 300

## 2019-10-15 MED ORDER — CEFAZOLIN SODIUM-DEXTROSE 2-4 GM/100ML-% IV SOLN
2.0000 g | Freq: Three times a day (TID) | INTRAVENOUS | Status: DC
  Administered 2019-10-15 – 2019-10-16 (×3): 2 g via INTRAVENOUS
  Filled 2019-10-15 (×6): qty 100

## 2019-10-15 MED ORDER — KETOROLAC TROMETHAMINE 30 MG/ML IJ SOLN
15.0000 mg | INTRAMUSCULAR | Status: AC
  Administered 2019-10-15: 15 mg via INTRAVENOUS
  Filled 2019-10-15: qty 1

## 2019-10-15 MED ORDER — HYDROXYZINE HCL 25 MG PO TABS
50.0000 mg | ORAL_TABLET | Freq: Three times a day (TID) | ORAL | Status: DC | PRN
  Administered 2019-10-15: 50 mg via ORAL
  Filled 2019-10-15: qty 2

## 2019-10-15 MED ORDER — SACCHAROMYCES BOULARDII 250 MG PO CAPS
250.0000 mg | ORAL_CAPSULE | Freq: Two times a day (BID) | ORAL | Status: DC
  Administered 2019-10-15 – 2019-10-17 (×4): 250 mg via ORAL
  Filled 2019-10-15 (×8): qty 1

## 2019-10-15 NOTE — ED Triage Notes (Signed)
Patient ambulatory to triage with steady gait, without difficulty or distress noted, mask in place; st dx cellulitis 5/30 and rx antibiotics; c/o increased "itching and burning" with increased swelling to face/rt ear

## 2019-10-15 NOTE — H&P (Signed)
History and Physical    Teresa Jefferson [id number] DOB: 03-Apr-1966 DOA: 10/15/2019  PCP: [facility]   Patient coming from: Home  I have personally briefly reviewed patient's old medical records in [place]  Chief Complaint: Facial pain and swelling  HPI: Teresa Jefferson is a 54 y.o. female with medical history significant for bipolar disorder, hypertension, schizophrenia and opioid dependence who presents to the emergency room for evaluation of swelling, pain/burning sensation involving the face and right ear.  Symptoms have been ongoing for 3 days and have been constant and worsening.  Denies having any aggravating or alleviating factors.  Patient was seen in the emergency room 2 days ago and diagnosed with facial cellulitis, she received a dose of IV Rocephin and was discharged on clindamycin. Her symptoms have continued to worsen despite taking the antibiotics. Patient has a WBC of 10,000 but is afebrile. Patient had a CT scan of the neck which was done on 10/13/19 and it showed facial subcutaneous edema, greater on the right. No evidence of an abscess. No intraorbital extension.   ED Course: Patient seen in the emergency room for evaluation of facial pain, redness and swelling.  Patient was seen in the ER 2 days prior and was started on clindamycin without any improvement in her symptoms.  She received a dose of IV vancomycin in the emergency room.  She will be admitted to the hospital for failure of outpatient antibiotic therapy.  Review of Systems: As per HPI otherwise 10 point review of systems negative.    Past Medical History:  Diagnosis Date  . Bipolar 1 disorder ([place])   . Depression   . Hypertension   . Neuropathy   . Schizophrenia ([place])   . Thyroid disease     Past Surgical History:  Procedure Laterality Date  . ABDOMINAL HYSTERECTOMY    . COLONOSCOPY N/A 11/23/2017   Procedure: COLONOSCOPY;  Surgeon: Lin Landsman, MD;  Location:  [facility] ENDOSCOPY;  Service: Gastroenterology;  Laterality: N/A;  . COLONOSCOPY N/A 11/24/2017   Procedure: COLONOSCOPY;  Surgeon: Lin Landsman, MD;  Location: [facility] ENDOSCOPY;  Service: Gastroenterology;  Laterality: N/A;  . KNEE SURGERY Right   . TONSILLECTOMY       reports that she has never smoked. She has never used smokeless tobacco. She reports previous drug use. She reports that she does not drink alcohol.  Allergies  Allergen Reactions  . Amoxicillin Rash  . Effexor [Venlafaxine] Rash    "whelps"  . Ketorolac Itching  . Lamotrigine Rash  . Sulfa Antibiotics Itching    No family history on file.   Prior to Admission medications   Medication Sig Start Date End Date Taking? Authorizing Provider  azithromycin (ZITHROMAX Z-PAK) 250 MG tablet Take 2 tablets (500 mg) on  Day 1,  followed by 1 tablet (250 mg) once daily on Days 2 through 5. 07/13/19   Earleen Newport, MD  benzonatate (TESSALON) 200 MG capsule Take 1 capsule (200 mg total) by mouth 3 (three) times daily as needed for cough. 07/13/19   Earleen Newport, MD  chlorpheniramine-HYDROcodone (TUSSIONEX PENNKINETIC ER) 10-8 MG/5ML SUER Take 5 mLs by mouth 2 (two) times daily. 06/07/19   Sable Feil, PA-C  clindamycin (CLEOCIN) 300 MG capsule Take 1 capsule (300 mg total) by mouth 3 (three) times daily for 10 days. 10/13/19 10/23/19  Triplett, Johnette Abraham B, FNP  gabapentin (NEURONTIN) 300 MG capsule Take 2 capsules (600 mg total) by mouth 3 (  three) times daily. 12/21/18   [name], [name], MD  hydrOXYzine (ATARAX/VISTARIL) 50 MG tablet Take 1 tablet (50 mg total) by mouth 3 (three) times daily as needed for anxiety. 12/21/18   [name], [name], MD  ibuprofen (ADVIL) 800 MG tablet Take 1 tablet (800 mg total) by mouth every 8 (eight) hours as needed for moderate pain. 12/21/18   [name], [name], MD  lisinopril (ZESTRIL) 10 MG tablet Take 1 tablet (10 mg total) by mouth daily. 12/21/18   [name], [name], MD  oxyCODONE-acetaminophen  (PERCOCET) 5-325 MG tablet Take 1 tablet by mouth every 6 (six) hours as needed. 10/13/19 10/12/20  Triplett, Johnette Abraham B, FNP  QUEtiapine (SEROQUEL) 400 MG tablet Take 1 tablet (400 mg total) by mouth at bedtime. 12/21/18   [name], [name], MD  traZODone (DESYREL) 100 MG tablet Take 1 tablet (100 mg total) by mouth at bedtime as needed for sleep. 12/21/18   [name], [name], MD    Physical Exam: Vitals:   10/15/19 0915 10/15/19 0931 10/15/19 0932 10/15/19 0945  BP:  (!) 165/110    Pulse: 81  92 92  Resp: 18  (!) 26 (!) 24  Temp:      TempSrc:      SpO2: 94%  97% 100%  Weight:      Height:         Vitals:   10/15/19 0915 10/15/19 0931 10/15/19 0932 10/15/19 0945  BP:  (!) 165/110    Pulse: 81  92 92  Resp: 18  (!) 26 (!) 24  Temp:      TempSrc:      SpO2: 94%  97% 100%  Weight:      Height:        Constitutional: NAD, alert and oriented x 3.  Appears uncomfortable Eyes: Periorbital swelling involving the right eye with redness over the right eyelid ENMT: Mucous membranes are moist. Blisters and redness in right ear Neck: normal, supple, no masses, no thyromegaly Respiratory: clear to auscultation bilaterally, no wheezing, no crackles. Normal respiratory effort. No accessory muscle use.  Cardiovascular: Regular rate and rhythm, no murmurs / rubs / gallops. No extremity edema. 2+ pedal pulses. No carotid bruits.  Abdomen: no tenderness, no masses palpated. No hepatosplenomegaly. Bowel sounds positive.  Musculoskeletal: no clubbing / cyanosis. No joint deformity upper and lower extremities.  Skin: redness involving both sides of the face Rt > Lt with involvement of the right ear,   Neurologic: No gross focal neurologic deficit. Psychiatric: Normal mood and affect.   Labs on Admission: I have personally reviewed following labs and imaging studies  CBC: Recent Labs  Lab 10/13/19 1259 10/15/19 0613  WBC 12.5* 10.9*  NEUTROABS 11.1* 8.9*  HGB 11.9* 10.6*  HCT 35.2* 33.2*  MCV  79.1* 82.0  PLT 223 [id number]   Basic Metabolic Panel: Recent Labs  Lab 10/13/19 1259 10/15/19 0613  NA 130* 137  K 3.2* 3.2*  CL 102 110  CO2 20* 19*  GLUCOSE 249* 102*  BUN 16 26*  CREATININE 1.06* 1.37*  CALCIUM 8.4* 8.7*   GFR: Estimated Creatinine Clearance: 46.5 mL/min (A) (by C-G formula based on SCr of 1.37 mg/dL (H)). Liver Function Tests: Recent Labs  Lab 10/13/19 1259 10/15/19 0613  AST 17 29  ALT 13 36  ALKPHOS 61 100  BILITOT 0.6 0.5  PROT 7.8 7.8  ALBUMIN 3.5 3.3*   No results for input(s): LIPASE, AMYLASE in the last 168 hours. No results for  input(s): AMMONIA in the last 168 hours. Coagulation Profile: No results for input(s): INR, PROTIME in the last 168 hours. Cardiac Enzymes: No results for input(s): CKTOTAL, CKMB, CKMBINDEX, TROPONINI in the last 168 hours. BNP (last 3 results) No results for input(s): PROBNP in the last 8760 hours. HbA1C: No results for input(s): HGBA1C in the last 72 hours. CBG: No results for input(s): GLUCAP in the last 168 hours. Lipid Profile: No results for input(s): CHOL, HDL, LDLCALC, TRIG, CHOLHDL, LDLDIRECT in the last 72 hours. Thyroid Function Tests: No results for input(s): TSH, T4TOTAL, FREET4, T3FREE, THYROIDAB in the last 72 hours. Anemia Panel: No results for input(s): VITAMINB12, FOLATE, FERRITIN, TIBC, IRON, RETICCTPCT in the last 72 hours. Urine analysis:    Component Value Date/Time   COLORURINE RED (A) 03/04/2019 1926   APPEARANCEUR CLOUDY (A) 03/04/2019 1926   LABSPEC 1.022 03/04/2019 1926   PHURINE  03/04/2019 1926    TEST NOT REPORTED DUE TO COLOR INTERFERENCE OF URINE PIGMENT   GLUCOSEU (A) 03/04/2019 1926    TEST NOT REPORTED DUE TO COLOR INTERFERENCE OF URINE PIGMENT   HGBUR (A) 03/04/2019 1926    TEST NOT REPORTED DUE TO COLOR INTERFERENCE OF URINE PIGMENT   BILIRUBINUR (A) 03/04/2019 1926    TEST NOT REPORTED DUE TO COLOR INTERFERENCE OF URINE PIGMENT   KETONESUR (A) 03/04/2019 1926    TEST  NOT REPORTED DUE TO COLOR INTERFERENCE OF URINE PIGMENT   PROTEINUR (A) 03/04/2019 1926    TEST NOT REPORTED DUE TO COLOR INTERFERENCE OF URINE PIGMENT   NITRITE (A) 03/04/2019 1926    TEST NOT REPORTED DUE TO COLOR INTERFERENCE OF URINE PIGMENT   LEUKOCYTESUR (A) 03/04/2019 1926    TEST NOT REPORTED DUE TO COLOR INTERFERENCE OF URINE PIGMENT    Radiological Exams on Admission: CT Soft Tissue Neck W Contrast  Result Date: 10/13/2019 CLINICAL DATA:  Right-sided facial swelling EXAM: CT NECK WITH CONTRAST TECHNIQUE: Multidetector CT imaging of the neck was performed using the standard protocol following the bolus administration of intravenous contrast. Multi detector CT imaging of the orbits was performed using the standard protocol following the bolus administration of intravenous contrast. CONTRAST:  66mL OMNIPAQUE IOHEXOL 300 MG/ML  SOLN COMPARISON:  None. FINDINGS: Motion artifact is present on the neck portion. Pharynx and larynx: Grossly unremarkable.  No mass or swelling. Salivary glands: Grossly unremarkable. Thyroid: Unremarkable. Lymph nodes: No enlarged nodes. Vascular: Major neck vessels appear patent where adequately visualized Limited intracranial: No abnormal enhancement. Orbits: No postseptal involvement. There is no significant intraorbital abnormality. Mastoids and visualized paranasal sinuses: Minor mucosal thickening. Left mastoid air cells are aerated. Opacification of underpneumatized right mastoid. Skeleton: Unremarkable. Upper chest: No apical lung mass. Other: Facial subcutaneous edema, greater on the right. IMPRESSION: CT neck portion is degraded by motion. Facial subcutaneous edema, greater on the right. No evidence of abscess. No intraorbital extension. Electronically Signed   By: Macy Mis M.D.   On: 10/13/2019 15:05   CT ORBITS W CONTRAST  Result Date: 10/13/2019 CLINICAL DATA:  Right-sided facial swelling EXAM: CT NECK WITH CONTRAST TECHNIQUE: Multidetector CT  imaging of the neck was performed using the standard protocol following the bolus administration of intravenous contrast. Multi detector CT imaging of the orbits was performed using the standard protocol following the bolus administration of intravenous contrast. CONTRAST:  75mL OMNIPAQUE IOHEXOL 300 MG/ML  SOLN COMPARISON:  None. FINDINGS: Motion artifact is present on the neck portion. Pharynx and larynx: Grossly unremarkable.  No mass or  swelling. Salivary glands: Grossly unremarkable. Thyroid: Unremarkable. Lymph nodes: No enlarged nodes. Vascular: Major neck vessels appear patent where adequately visualized Limited intracranial: No abnormal enhancement. Orbits: No postseptal involvement. There is no significant intraorbital abnormality. Mastoids and visualized paranasal sinuses: Minor mucosal thickening. Left mastoid air cells are aerated. Opacification of underpneumatized right mastoid. Skeleton: Unremarkable. Upper chest: No apical lung mass. Other: Facial subcutaneous edema, greater on the right. IMPRESSION: CT neck portion is degraded by motion. Facial subcutaneous edema, greater on the right. No evidence of abscess. No intraorbital extension. Electronically Signed   By: Macy Mis M.D.   On: 10/13/2019 15:05    EKG: Independently reviewed.   Assessment/Plan Principal Problem:   Facial cellulitis Active Problems:   Opiate abuse, continuous (HCC)   Essential hypertension   Hypokalemia   Bipolar disorder (HCC)    Facial cellulitis With failure of outpatient antibiotic therapy Patient presents with worsening redness, swelling and differential warmth involving both sides of her face (Rt > Lt) with involvement of her right ear Place patient on IV Ancef Request ID consult   Bipolar Disorder Continue Seroquel and trazodone  Hypokalemia Supplement potassium   Hypertension Continue lisinopril  DVT prophylaxis: Lovenox Code Status: Full Code Family Communication: Greater than 50%  of time was spent discussing patient's condition and plan of care with her at the bedside.  All questions and concerns have been addressed. Disposition Plan: Back to previous home environment Consults called: Infectious disease      MD [facility]     10/15/2019, 10:09 AM

## 2019-10-15 NOTE — Consult Note (Signed)
NAME: Teresa Jefferson  DOB: 03-10-1966  MRN: [id number]  Date/Time: 10/15/2019 5:41 PM  REQUESTING PROVIDER:agbata Subjective:  REASON FOR CONSULT: Facial cellulitis ? CHARMION HEHR is a 54 y.o. female with a history of hypertension, bipolar disorder, thyroid disease, substance abuse presents with painful swelling of the face for the past few days. As per patient she snorted crystal meth a few days ago and that caused her to bleed from her right nostril profusely.  She controlled it with multiple tissues.  Then 24 hours later she started noticing some swelling and pain on the right side of her face and the right ear with blistering.  She also had fever at home.  And she came to the ED on 10/13/2019.  Her vitals on 10/13/2019 was heart rate of 110, blood pressure of 104/65, temperature of 99.1.  Labs revealed a WBC of 12.5.  Blood cultures were sent.  She was prescribed clindamycin and discharged from the ED. She took 4 doses of clindamycin and as there was no improvement and she was worsening she came to the ED this morning complaining of itching and burning with increased swelling to the face.  In the ED her vitals were temperature of 97.7, blood pressure of 114/70, heart rate of 86, pulse ox of 97% Blood work was sent and WBC was 10.9, lactate of 0.6, creatinine of 1.37, blood glucose of 102, hemoglobin of 10.6, platelet of 228.  Blood culture from 10/13/2019 was no growth and repeat blood cultures were sent today. I am asked to see the patient for the facial cellulitis..  Past Medical History:  Diagnosis Date  . Asthma   . Bipolar 1 disorder ([place])   . Depression   . Hypertension   . Neuropathy   . Schizophrenia ([place])   . Thyroid disease     Past Surgical History:  Procedure Laterality Date  . ABDOMINAL HYSTERECTOMY    . COLONOSCOPY N/A 11/23/2017   Procedure: COLONOSCOPY;  Surgeon: Lin Landsman, MD;  [facility];  Service: Gastroenterology;  Laterality: N/A;  .  COLONOSCOPY N/A 11/24/2017   Procedure: COLONOSCOPY;  Surgeon: Lin Landsman, MD;  Location: [facility] ENDOSCOPY;  Service: Gastroenterology;  Laterality: N/A;  . KNEE SURGERY Right   . TONSILLECTOMY      Social History   Socioeconomic History  . Marital status: Legally Separated    Spouse name: Not on file  . Number of children: Not on file  . Years of education: Not on file  . Highest education level: Not on file  Occupational History  . Not on file  Tobacco Use  . Smoking status: Never Smoker  . Smokeless tobacco: Never Used  Substance and Sexual Activity  . Alcohol use: Yes  . Drug use: Not Currently    Comment: crack and opiates  . Sexual activity: Not on file  Other Topics Concern  . Not on file  Social History Narrative  . Not on file   Social Determinants of Health   Financial Resource Strain:   . Difficulty of Paying Living Expenses:   Food Insecurity:   . Worried About Charity fundraiser in the Last Year:   . Arboriculturist in the Last Year:   Transportation Needs:   . Film/video editor (Medical):   Marland Kitchen Lack of Transportation (Non-Medical):   Physical Activity:   . Days of Exercise per Week:   . Minutes of Exercise per Session:   Stress:   . Feeling  of Stress :   Social Connections:   . Frequency of Communication with Friends and Family:   . Frequency of Social Gatherings with Friends and Family:   . Attends Religious Services:   . Active Member of Clubs or Organizations:   . Attends Archivist Meetings:   Marland Kitchen Marital Status:   Intimate Partner Violence:   . Fear of Current or Ex-Partner:   . Emotionally Abused:   Marland Kitchen Physically Abused:   . Sexually Abused:     History reviewed. No pertinent family history. Allergies  Allergen Reactions  . Amoxicillin Rash  . Effexor [Venlafaxine] Rash    "whelps"  . Ketorolac Itching  . Lamotrigine Rash  . Sulfa Antibiotics Itching   ? Current Facility-Administered Medications  Medication Dose  Route Frequency Provider Last Rate Last Admin  . 0.9 %  sodium chloride infusion   Intravenous Continuous Agbata, Tochukwu, MD   Stopped at 10/15/19 1546  . ceFAZolin (ANCEF) IVPB 1 g/50 mL premix  1 g Intravenous Q8H Agbata, Tochukwu, MD   Stopped at 10/15/19 1043  . clindamycin (CLEOCIN) IVPB 600 mg  600 mg Intravenous Q8H Agbata, Tochukwu, MD   Stopped at 10/15/19 1504  . enoxaparin (LOVENOX) injection 40 mg  40 mg Subcutaneous Q24H Agbata, Tochukwu, MD   40 mg at 10/15/19 1018  . gabapentin (NEURONTIN) tablet 600 mg  600 mg Oral TID Agbata, Tochukwu, MD   600 mg at 10/15/19 1713  . hydrOXYzine (ATARAX/VISTARIL) tablet 50 mg  50 mg Oral TID PRN Collier Bullock, MD   50 mg at 10/15/19 1237  . lisinopril (ZESTRIL) tablet 10 mg  10 mg Oral Daily Agbata, Tochukwu, MD   10 mg at 10/15/19 1012  . ondansetron (ZOFRAN) tablet 4 mg  4 mg Oral Q6H PRN Agbata, Tochukwu, MD       Or  . ondansetron (ZOFRAN) injection 4 mg  4 mg Intravenous Q6H PRN Agbata, Tochukwu, MD      . oxyCODONE-acetaminophen (PERCOCET/ROXICET) 5-325 MG per tablet 1 tablet  1 tablet Oral Q6H PRN Agbata, Tochukwu, MD   1 tablet at 10/15/19 1237  . QUEtiapine (SEROQUEL) tablet 400 mg  400 mg Oral QHS Agbata, Tochukwu, MD      . saccharomyces boulardii (FLORASTOR) capsule 250 mg  250 mg Oral BID Agbata, Tochukwu, MD   250 mg at 10/15/19 1437  . traZODone (DESYREL) tablet 100 mg  100 mg Oral QHS PRN Agbata, Tochukwu, MD         Abtx:  Anti-infectives (From admission, onward)   Start     Dose/Rate Route Frequency Ordered Stop   10/15/19 1200  clindamycin (CLEOCIN) IVPB 600 mg     600 mg 100 mL/hr over 30 Minutes Intravenous Every 8 hours 10/15/19 1033     10/15/19 1000  ceFAZolin (ANCEF) IVPB 1 g/50 mL premix     1 g 100 mL/hr over 30 Minutes Intravenous Every 8 hours 10/15/19 0858     10/15/19 0745  vancomycin (VANCOREADY) IVPB 1500 mg/300 mL     1,500 mg 150 mL/hr over 120 Minutes Intravenous  Once 10/15/19 0730 10/15/19 1018        REVIEW OF SYSTEMS:  Const:fever,  chills, negative weight loss Eyes: Unable to open her right eyelid ENT: negative coryza, negative sore throat, epistaxis Resp: negative cough, hemoptysis, dyspnea Cards: negative for chest pain, palpitations, lower extremity edema GU: negative for frequency, dysuria and hematuria GI: Negative for abdominal pain, diarrhea, bleeding, constipation Skin: negative for  rash and pruritus Heme: negative for easy bruising and gum/nose bleeding MS: negative for myalgias, arthralgias, back pain and muscle weakness Neurolo: Headache Psych: Has bipolar disorder Endocrine: Has thyroid problems Allergy/Immunology-allergy as above: Objective:  VITALS:  BP (!) 149/106 (BP Location: Left Arm)   Pulse 76   Temp 98.1 F (36.7 C) (Oral)   Resp 20   Ht 5\' 2"  (1.575 m)   Wt 79.8 kg   SpO2 100%   BMI 32.19 kg/m  PHYSICAL EXAM:  General: Alert, in distress, appears stated age.  Head: Normocephalic, without obvious abnormality, atraumatic. Eyes: Right eyelids swollen and eyes closed Findings scabbing of the eyelid Face swollen with erythema with clear edge margin on the forehead crossing over the center to involve the left Maller area right ear pinna is swollen and indurated with some discharge from the outer ear ENT: No nasal perforation Small scab over the right ala nasi      Lips, mucosa, and tongue normal. No Thrush Neck: Supple, symmetrical, no adenopathy, thyroid: non tender no carotid bruit and no JVD. Back: No CVA tenderness. Lungs: Clear to auscultation bilaterally. No Wheezing or Rhonchi. No rales. Heart: Regular rate and rhythm, no murmur, rub or gallop. Abdomen: Soft, non-tender,not distended. Bowel sounds normal. No masses Extremities:    Fine follicular purpuric spots over the shins of both legs  atraumatic, no cyanosis. No edema. No clubbing Skin: No rashes or lesions. Or bruising Lymph: Cervical, supraclavicular  normal. Neurologic: Grossly non-focal Pertinent Labs Lab Results CBC    Component Value Date/Time   WBC 10.9 (H) 10/15/2019 0613   RBC 4.05 10/15/2019 0613   HGB 10.6 (L) 10/15/2019 0613   HCT 33.2 (L) 10/15/2019 0613   PLT 228 10/15/2019 0613   MCV 82.0 10/15/2019 0613   MCH 26.2 10/15/2019 0613   MCHC 31.9 10/15/2019 0613   RDW 15.9 (H) 10/15/2019 0613   LYMPHSABS 1.1 10/15/2019 0613   MONOABS 0.6 10/15/2019 0613   EOSABS 0.2 10/15/2019 0613   BASOSABS 0.0 10/15/2019 0613    CMP Latest Ref Rng & Units 10/15/2019 10/13/2019 03/04/2019  Glucose 70 - 99 mg/dL 102(H) 249(H) 112(H)  BUN 6 - 20 mg/dL 26(H) 16 30(H)  Creatinine 0.44 - 1.00 mg/dL 1.37(H) 1.06(H) 1.05(H)  Sodium 135 - 145 mmol/L 137 130(L) 139  Potassium 3.5 - 5.1 mmol/L 3.2(L) 3.2(L) 4.1  Chloride 98 - 111 mmol/L 110 102 107  CO2 22 - 32 mmol/L 19(L) 20(L) 26  Calcium 8.9 - 10.3 mg/dL 8.7(L) 8.4(L) 9.8  Total Protein 6.5 - 8.1 g/dL 7.8 7.8 9.0(H)  Total Bilirubin 0.3 - 1.2 mg/dL 0.5 0.6 0.6  Alkaline Phos 38 - 126 U/L 100 61 65  AST 15 - 41 U/L 29 17 20   ALT 0 - 44 U/L 36 13 14      Microbiology: Recent Results (from the past 240 hour(s))  Blood culture (routine x 2)     Status: None (Preliminary result)   Collection Time: 10/13/19  2:26 PM   Specimen: BLOOD  Result Value Ref Range Status   Specimen Description BLOOD LEFT ANTECUBITAL  Final   Special Requests   Final    BOTTLES DRAWN AEROBIC AND ANAEROBIC Blood Culture adequate volume   Culture   Final    NO GROWTH 2 DAYS Performed at [facility], [address] [place], [place] [address]    Report Status PENDING  Incomplete  Blood culture (routine x 2)     Status: None (Preliminary result)  Collection Time: 10/13/19  2:26 PM   Specimen: BLOOD  Result Value Ref Range Status   Specimen Description BLOOD BLOOD LEFT HAND  Final   Special Requests   Final    BOTTLES DRAWN AEROBIC AND ANAEROBIC Blood Culture adequate volume   Culture    Final    NO GROWTH 2 DAYS Performed at [facility], [address] [place], [place] [address]    Report Status PENDING  Incomplete  SARS Coronavirus 2 by RT PCR (hospital order, performed in [place] hospital lab) Nasopharyngeal Nasopharyngeal Swab     Status: None   Collection Time: 10/15/19  7:46 AM   Specimen: Nasopharyngeal Swab  Result Value Ref Range Status   SARS Coronavirus 2 NEGATIVE NEGATIVE Final    Comment: (NOTE) SARS-CoV-2 target nucleic acids are NOT DETECTED. The SARS-CoV-2 RNA is generally detectable in upper and lower respiratory specimens during the acute phase of infection. The lowest concentration of SARS-CoV-2 viral copies this assay can detect is 250 copies / mL. A negative result does not preclude SARS-CoV-2 infection and should not be used as the sole basis for treatment or other patient management decisions.  A negative result may occur with improper specimen collection / handling, submission of specimen other than nasopharyngeal swab, presence of viral mutation(s) within the areas targeted by this assay, and inadequate number of viral copies (<250 copies / mL). A negative result must be combined with clinical observations, patient history, and epidemiological information. Fact Sheet for Patients:   StrictlyIdeas.no Fact Sheet for Healthcare Providers: BankingDealers.co.za This test is not yet approved or cleared  by the [place] FDA and has been authorized for detection and/or diagnosis of SARS-CoV-2 by FDA under an Emergency Use Authorization (EUA).  This EUA will remain in effect (meaning this test can be used) for the duration of the COVID-19 declaration under Section 564(b)(1) of the Act, 21 U.S.C. section 360bbb-3(b)(1), unless the authorization is terminated or revoked sooner. Performed at [facility], [address] [place], [place] [address]     IMAGING  RESULTS:   I have personally reviewed the films ? Impression/Recommendation ?Right facial cellulitis involving the right ear and extending to the left side Risk factors insufflation of crystal meth a day before the facial cellulitis was noted.  She had epistaxis following crystal meth use.  Very likely this could be staph aureus which could have been colonizing her nares. Patient is currently on cefazolin we will add clindamycin.  We will send culture of the nares wound, PCR MRSA of the nares and culture of the right ear.  Recommend ophthalmology consult ? ?History of substance use.  Denies IV drug use  Bipolar disorder on quetiapine and trazodone  __Hypertension on lisinopril. Discussed the management with the patient and the requesting provider.  ______________________________________________Note:  This document was prepared using Dragon voice recognition software and may include unintentional dictation errors.

## 2019-10-15 NOTE — ED Notes (Signed)
Pt ambulatory to toilet with steady gait to urinate.

## 2019-10-15 NOTE — ED Notes (Signed)
Pt ambulatory to toilet with steady gait noted.  

## 2019-10-15 NOTE — ED Notes (Signed)
Pt ambulatory to toilet with steady gait.  

## 2019-10-15 NOTE — ED Notes (Signed)
Pt ambulatory to toilet to urinate.

## 2019-10-15 NOTE — ED Notes (Signed)
Pt ambulatory to toilet

## 2019-10-15 NOTE — ED Provider Notes (Signed)
[facility] Emergency Department Provider Note  ____________________________________________  Time seen: Approximately 8:20 AM  I have reviewed the triage vital signs and the nursing notes.   HISTORY  Chief Complaint Facial pain and swelling   HPI Teresa Jefferson is a 54 y.o. female with a history of bipolar disorder hypertension schizophrenia and opioid dependence who comes the ED complaining of swelling burning and pain of bilateral face and right ear.   This has been ongoing for 3 days, constant, worsening.  Nonradiating, no aggravating or alleviating factors.  Came to the ED 2 days ago, diagnosed with facial cellulitis, given a dose of IV ceftriaxone and started on clindamycin.  She reports that it is still worsening despite this.  No difficulty breathing.  She does have a history of Suboxone use due to a prior history of opioid pill abuse.  She denies IVDU, but does report snorting methamphetamine 2 days ago.     Past Medical History:  Diagnosis Date  . Bipolar 1 disorder ([place])   . Depression   . Hypertension   . Neuropathy   . Schizophrenia ([place])   . Thyroid disease      Patient Active Problem List   Diagnosis Date Noted  . MDD (major depressive disorder), recurrent episode, severe ([place]) 12/18/2018  . MDD (major depressive disorder), recurrent, severe, with psychosis ([place]) 12/05/2017  . Essential hypertension 11/17/2017  . Asthma 11/17/2017  . 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 Cocaine use disorder, severe, dependence ([place]) 11/17/2017  . Opioid use disorder, severe, dependence ([place]) 11/17/2017  . Severe episode of recurrent major depressive disorder, without psychotic features ([place]) 11/16/2017  . Opiate abuse, continuous ([place]) 11/16/2017  . Cocaine abuse ([place]) 11/16/2017  . Suicidal ideation 11/16/2017  . Chronic pain 11/16/2017     Past Surgical History:  Procedure Laterality Date  . ABDOMINAL  HYSTERECTOMY    . COLONOSCOPY N/A 11/23/2017   Procedure: COLONOSCOPY;  Surgeon: Lin Landsman, MD;  Location: [facility] ENDOSCOPY;  Service: Gastroenterology;  Laterality: N/A;  . COLONOSCOPY N/A 11/24/2017   Procedure: COLONOSCOPY;  Surgeon: Lin Landsman, MD;  Location: [facility] ENDOSCOPY;  Service: Gastroenterology;  Laterality: N/A;  . KNEE SURGERY Right   . TONSILLECTOMY       Prior to Admission medications   Medication Sig Start Date End Date Taking? Authorizing Provider  azithromycin (ZITHROMAX Z-PAK) 250 MG tablet Take 2 tablets (500 mg) on  Day 1,  followed by 1 tablet (250 mg) once daily on Days 2 through 5. 07/13/19   Earleen Newport, MD  benzonatate (TESSALON) 200 MG capsule Take 1 capsule (200 mg total) by mouth 3 (three) times daily as needed for cough. 07/13/19   Earleen Newport, MD  chlorpheniramine-HYDROcodone (TUSSIONEX PENNKINETIC ER) 10-8 MG/5ML SUER Take 5 mLs by mouth 2 (two) times daily. 06/07/19   Sable Feil, PA-C  clindamycin (CLEOCIN) 300 MG capsule Take 1 capsule (300 mg total) by mouth 3 (three) times daily for 10 days. 10/13/19 10/23/19  Triplett, Johnette Abraham B, FNP  gabapentin (NEURONTIN) 300 MG capsule Take 2 capsules (600 mg total) by mouth 3 (three) times daily. 12/21/18   [name], [name], MD  hydrOXYzine (ATARAX/VISTARIL) 50 MG tablet Take 1 tablet (50 mg total) by mouth 3 (three) times daily as needed for anxiety. 12/21/18   [name], [name], MD  ibuprofen (ADVIL) 800 MG tablet Take 1 tablet (800 mg total) by mouth every 8 (eight) hours as needed for moderate  pain. 12/21/18   [name], [name], MD  lisinopril (ZESTRIL) 10 MG tablet Take 1 tablet (10 mg total) by mouth daily. 12/21/18   [name], [name], MD  oxyCODONE-acetaminophen (PERCOCET) 5-325 MG tablet Take 1 tablet by mouth every 6 (six) hours as needed. 10/13/19 10/12/20  Triplett, Johnette Abraham B, FNP  QUEtiapine (SEROQUEL) 400 MG tablet Take 1 tablet (400 mg total) by mouth at bedtime. 12/21/18   [name], [name], MD   traZODone (DESYREL) 100 MG tablet Take 1 tablet (100 mg total) by mouth at bedtime as needed for sleep. 12/21/18   [name], [name], MD     Allergies Amoxicillin, Effexor [venlafaxine], Ketorolac, Lamotrigine, and Sulfa antibiotics   No family history on file.  Social History Social History   Tobacco Use  . Smoking status: Never Smoker  . Smokeless tobacco: Never Used  Substance Use Topics  . Alcohol use: No  . Drug use: Not Currently    Comment: crack and opiates    Review of Systems  Constitutional:   No fever or chills.  ENT:   No sore throat.  Positive facial swelling and right ear swelling Cardiovascular:   No chest pain or syncope. Respiratory:   No dyspnea or cough. Gastrointestinal:   Negative for abdominal pain, vomiting and diarrhea.  Musculoskeletal:   Negative for focal pain or swelling All other systems reviewed and are negative except as documented above in ROS and HPI.  ____________________________________________   PHYSICAL EXAM:  VITAL SIGNS: ED Triage Vitals  Enc Vitals Group     BP 10/15/19 0600 114/70     Pulse Rate 10/15/19 0600 86     Resp 10/15/19 0600 18     Temp 10/15/19 0600 97.7 F (36.5 C)     Temp Source 10/15/19 0600 Oral     SpO2 10/15/19 0600 97 %     Weight 10/15/19 0558 176 lb (79.8 kg)     Height 10/15/19 0558 5\' 2"  (1.575 m)     Head Circumference --      Peak Flow --      Pain Score 10/15/19 0558 10     Pain Loc --      Pain Edu? --      Excl. in [place]? --     Vital signs reviewed, nursing assessments reviewed.   Constitutional:   Alert and oriented. Non-toxic appearance. Eyes:   Conjunctivae are normal. EOMI and painless. PERRL.  Bilateral periorbital swelling.  No eye pain or tenderness, globes are not firm, no proptosis. ENT      Head:   Normocephalic and atraumatic.  Bright erythema bilateral forehead and maxilla extending from left periorbital to include the entirety of the right ear.  There is some weeping of the  right ear.  No crepitus      Nose:   Normal      Mouth/Throat:   Moist mucosa, no airway swelling or tongue elevation.  Floor of mouth is soft      Neck:   No meningismus. Full ROM.  No anterior or submental swelling.  There is erythema down the right side of the neck.  No crepitus Hematological/Lymphatic/Immunilogical:   No cervical lymphadenopathy. Cardiovascular:   RRR. Symmetric bilateral radial and DP pulses.  No murmurs. Cap refill less than 2 seconds. Respiratory:   Normal respiratory effort without tachypnea/retractions. Breath sounds are clear and equal bilaterally. No wheezes/rales/rhonchi. Gastrointestinal:   Soft and nontender. Non distended. There is no CVA tenderness.  No rebound, rigidity,  or guarding. Musculoskeletal:   Normal range of motion in all extremities. No joint effusions.  No lower extremity tenderness.  No edema. Neurologic:   Normal speech and language.  Motor grossly intact. No acute focal neurologic deficits are appreciated.  Skin:    Skin is warm, dry and intact.  Facial erysipelas rash as above without evidence of abscess or necrotizing fasciitis.  No open wounds.  No petechiae, purpura, or bullae.  ____________________________________________    LABS (pertinent positives/negatives) (all labs ordered are listed, but only abnormal results are displayed) Labs Reviewed  COMPREHENSIVE METABOLIC PANEL - Abnormal; Notable for the following components:      Result Value   Potassium 3.2 (*)    CO2 19 (*)    Glucose, Bld 102 (*)    BUN 26 (*)    Creatinine, Ser 1.37 (*)    Calcium 8.7 (*)    Albumin 3.3 (*)    GFR calc non Af Amer 44 (*)    GFR calc Af Amer 51 (*)    All other components within normal limits  CBC WITH DIFFERENTIAL/PLATELET - Abnormal; Notable for the following components:   WBC 10.9 (*)    Hemoglobin 10.6 (*)    HCT 33.2 (*)    RDW 15.9 (*)    Neutro Abs 8.9 (*)    All other components within normal limits  CULTURE, BLOOD (ROUTINE X 2)   CULTURE, BLOOD (ROUTINE X 2)  SARS CORONAVIRUS 2 BY RT PCR (HOSPITAL ORDER, [place] LAB)  LACTIC ACID, PLASMA  LACTIC ACID, PLASMA   ____________________________________________   EKG    ____________________________________________    RADIOLOGY  No results found.  ____________________________________________   PROCEDURES Procedures  ____________________________________________    CLINICAL IMPRESSION / ASSESSMENT AND PLAN / ED COURSE  Medications ordered in the ED: Medications  vancomycin (VANCOREADY) IVPB 1500 mg/300 mL (1,500 mg Intravenous New Bag/Given 10/15/19 0816)  ketorolac (TORADOL) 30 MG/ML injection 15 mg (15 mg Intravenous Given 10/15/19 0747)  diphenhydrAMINE (BENADRYL) injection 25 mg (25 mg Intravenous Given 10/15/19 0747)    Pertinent labs & imaging results that were available during my care of the patient were reviewed by me and considered in my medical decision making (see chart for details).  Teresa Jefferson was evaluated in Emergency Department on 10/15/2019 for the symptoms described in the history of present illness. She was evaluated in the context of the global COVID-19 pandemic, which necessitated consideration that the patient might be at risk for infection with the SARS-CoV-2 virus that causes COVID-19. Institutional protocols and algorithms that pertain to the evaluation of patients at risk for COVID-19 are in a state of rapid change based on information released by regulatory bodies including the CDC and federal and state organizations. These policies and algorithms were followed during the patient's care in the ED.   Patient presents with worsening erythema and pain of the face, consistent with facial erysipelas.  Doubt osteomyelitis, malignant otitis externa, abscess, necrotizing fasciitis.  She is not septic, vital signs are normal and she is nontoxic.  I will start IV vancomycin, plan to  hospitalize.  ----------------------------------------- 7:33 AM on 10/15/2019 ----------------------------------------- D/w hospitalist.   ----------------------------------------- 8:42 AM on 10/15/2019 -----------------------------------------  Inadequate pain control with IV Toradol.  Anticipate opioids will be ineffective for her given her long history of dependence and risk worsening her dependence and abuse issues.  I will try low-dose ketamine for pain control.      ____________________________________________  FINAL CLINICAL IMPRESSION(S) / ED DIAGNOSES    Final diagnoses:  Erysipelas  Uncomplicated opioid dependence ([place])  Failure of outpatient treatment     ED Discharge Orders    None      Portions of this note were generated with dragon dictation software. Dictation errors may occur despite best attempts at proofreading.   Carrie Mew, MD 10/15/19 [id number]

## 2019-10-16 DIAGNOSIS — E876 Hypokalemia: Secondary | ICD-10-CM

## 2019-10-16 DIAGNOSIS — T43621A Poisoning by amphetamines, accidental (unintentional), initial encounter: Secondary | ICD-10-CM

## 2019-10-16 DIAGNOSIS — L03211 Cellulitis of face: Principal | ICD-10-CM

## 2019-10-16 LAB — BASIC METABOLIC PANEL
Anion gap: 6 (ref 5–15)
BUN: 15 mg/dL (ref 6–20)
CO2: 20 mmol/L — ABNORMAL LOW (ref 22–32)
Calcium: 8.5 mg/dL — ABNORMAL LOW (ref 8.9–10.3)
Chloride: 117 mmol/L — ABNORMAL HIGH (ref 98–111)
Creatinine, Ser: 1.08 mg/dL — ABNORMAL HIGH (ref 0.44–1.00)
GFR calc Af Amer: >60 mL/min (ref >60)
GFR calc non Af Amer: 59 mL/min — ABNORMAL LOW (ref >60)
Glucose, Bld: 94 mg/dL (ref 70–99)
Potassium: 3.6 mmol/L (ref 3.5–5.1)
Sodium: 143 mmol/L (ref 135–145)

## 2019-10-16 LAB — CBC
HCT: 28.3 % — ABNORMAL LOW (ref 36.0–46.0)
Hemoglobin: 8.9 g/dL — ABNORMAL LOW (ref 12.0–15.0)
MCH: 26.2 pg (ref 26.0–34.0)
MCHC: 31.4 g/dL (ref 30.0–36.0)
MCV: 83.2 fL (ref 80.0–100.0)
Platelets: 204 10*3/uL (ref 150–400)
RBC: 3.4 MIL/uL — ABNORMAL LOW (ref 3.87–5.11)
RDW: 16.4 % — ABNORMAL HIGH (ref 11.5–15.5)
WBC: 6.6 10*3/uL (ref 4.0–10.5)
nRBC: 0 % (ref 0.0–0.2)

## 2019-10-16 LAB — HEPATITIS PANEL, ACUTE
HCV Ab: NONREACTIVE
Hep A IgM: NONREACTIVE
Hep B C IgM: NONREACTIVE
Hepatitis B Surface Ag: NONREACTIVE

## 2019-10-16 LAB — MRSA PCR SCREENING: MRSA by PCR: NEGATIVE

## 2019-10-16 MED ORDER — OXYCODONE-ACETAMINOPHEN 5-325 MG PO TABS
1.0000 | ORAL_TABLET | ORAL | Status: DC | PRN
  Administered 2019-10-16 – 2019-10-17 (×6): 2 via ORAL
  Filled 2019-10-16 (×6): qty 2

## 2019-10-16 MED ORDER — HYDROCERIN EX CREA
TOPICAL_CREAM | Freq: Three times a day (TID) | CUTANEOUS | Status: DC
  Filled 2019-10-16: qty 113

## 2019-10-16 MED ORDER — SODIUM CHLORIDE 0.9 % IV SOLN
2.0000 g | Freq: Three times a day (TID) | INTRAVENOUS | Status: DC
  Administered 2019-10-16 – 2019-10-17 (×2): 2 g via INTRAVENOUS
  Filled 2019-10-16 (×4): qty 2

## 2019-10-16 MED ORDER — ALUM & MAG HYDROXIDE-SIMETH 200-200-20 MG/5ML PO SUSP
15.0000 mL | Freq: Once | ORAL | Status: AC
  Administered 2019-10-16: 15 mL via ORAL
  Filled 2019-10-16: qty 30

## 2019-10-16 NOTE — Consult Note (Signed)
Reason for Consult: orbital/facial cellulitis Referring Physician: Dr. Fritzi Mandes Chief complaint: eyelid/facial swelling, right eye  HPI: Teresa Jefferson is an 54 y.o. female with no significant past ocular history, inpatient for facial cellulitis following snorting of crystal meth.  History is obtained directly from the patient, who is pleasant and cooperative.   She reports that less than one week ago she snorted some crystal meth in the right nostril for the first time.  The next day she experienced fevers and chills and some facial swelling.  She sought care at the [facility] ED. Later she reported worsening of facial swelling, tenderness and pain.  She was admitted and treated with IV antibiotics, ID and ophtho were consulted.   She reports significant improvement today, and is now able to see out of the right eye.  Initially she complained of some blurry vision out of the right eye, and then no vision with the eyelid swollen shut.  She reports normal baseline vision in both eyes at this time.  She also complains of dryness and irritation, as well as tenderness over the right eyelids.   She denies diplopia, flashes of light.   Past Medical History:  Diagnosis Date   Asthma    Bipolar 1 disorder ([place])    Depression    Hypertension    Neuropathy    Schizophrenia ([place])    Thyroid disease     ROS  Past Surgical History:  Procedure Laterality Date   ABDOMINAL HYSTERECTOMY     COLONOSCOPY N/A 11/23/2017   Procedure: COLONOSCOPY;  Surgeon: Lin Landsman, MD;  Location: [facility] ENDOSCOPY;  Service: Gastroenterology;  Laterality: N/A;   COLONOSCOPY N/A 11/24/2017   Procedure: COLONOSCOPY;  Surgeon: Lin Landsman, MD;  Location: [facility] ENDOSCOPY;  Service: Gastroenterology;  Laterality: N/A;   KNEE SURGERY Right    TONSILLECTOMY     Occasionally wears OTC readers. Hasn't seen an eye doctor for "many years."  History reviewed. No pertinent family history.  Social  History:  reports that she has never smoked. She has never used smokeless tobacco. She reports current alcohol use. She reports previous drug use.  Allergies:  Allergies  Allergen Reactions   Amoxicillin Rash   Effexor [Venlafaxine] Rash    "whelps"   Ketorolac Itching   Lamotrigine Rash   Sulfa Antibiotics Itching    Prior to Admission medications   Medication Sig Start Date End Date Taking? Authorizing Provider  Buprenorphine HCl-Naloxone HCl 8-2 MG FILM Take 1 Film by mouth daily at 12 noon.   Yes [provider]  clindamycin (CLEOCIN) 300 MG capsule Take 1 capsule (300 mg total) by mouth 3 (three) times daily for 10 days. 10/13/19 10/23/19 Yes Triplett, Cari B, FNP  gabapentin (NEURONTIN) 300 MG capsule Take 2 capsules (600 mg total) by mouth 3 (three) times daily. 12/21/18  Yes [name], [name], MD  ibuprofen (ADVIL) 800 MG tablet Take 1 tablet (800 mg total) by mouth every 8 (eight) hours as needed for moderate pain. 12/21/18  Yes [name], [name], MD  lisinopril (ZESTRIL) 10 MG tablet Take 1 tablet (10 mg total) by mouth daily. 12/21/18  Yes [name], [name], MD  oxyCODONE-acetaminophen (PERCOCET) 5-325 MG tablet Take 1 tablet by mouth every 6 (six) hours as needed. 10/13/19 10/12/20 Yes Triplett, Cari B, FNP  QUEtiapine (SEROQUEL) 400 MG tablet Take 1 tablet (400 mg total) by mouth at bedtime. 12/21/18  Yes [name], [name], MD  benzonatate (TESSALON) 200 MG capsule Take 1 capsule (  200 mg total) by mouth 3 (three) times daily as needed for cough. Patient not taking: Reported on 10/15/2019 07/13/19   Earleen Newport, MD    Results for orders placed or performed during the hospital encounter of 10/15/19 (from the past 48 hour(s))  Lactic acid, plasma     Status: None   Collection Time: 10/15/19  6:13 AM  Result Value Ref Range   Lactic Acid, Venous 0.6 0.5 - 1.9 mmol/L    Comment: Performed at [facility], [address] [place], [place] [address]  Comprehensive  metabolic panel     Status: Abnormal   Collection Time: 10/15/19  6:13 AM  Result Value Ref Range   Sodium 137 135 - 145 mmol/L    Comment: RESULTS VERIFIED BY REPEAT TESTING/HKP   Potassium 3.2 (L) 3.5 - 5.1 mmol/L   Chloride 110 98 - 111 mmol/L   CO2 19 (L) 22 - 32 mmol/L   Glucose, Bld 102 (H) 70 - 99 mg/dL    Comment: Glucose reference range applies only to samples taken after fasting for at least 8 hours.   BUN 26 (H) 6 - 20 mg/dL   Creatinine, Ser 1.37 (H) 0.44 - 1.00 mg/dL   Calcium 8.7 (L) 8.9 - 10.3 mg/dL   Total Protein 7.8 6.5 - 8.1 g/dL   Albumin 3.3 (L) 3.5 - 5.0 g/dL   AST 29 15 - 41 U/L   ALT 36 0 - 44 U/L   Alkaline Phosphatase 100 38 - 126 U/L   Total Bilirubin 0.5 0.3 - 1.2 mg/dL   GFR calc non Af Amer 44 (L) >60 mL/min   GFR calc Af Amer 51 (L) >60 mL/min   Anion gap 8 5 - 15    Comment: Performed at [facility], [address] [place], [place] [address]  CBC with Differential     Status: Abnormal   Collection Time: 10/15/19  6:13 AM  Result Value Ref Range   WBC 10.9 (H) 4.0 - 10.5 K/uL   RBC 4.05 3.87 - 5.11 MIL/uL   Hemoglobin 10.6 (L) 12.0 - 15.0 g/dL   HCT 33.2 (L) 36.0 - 46.0 %   MCV 82.0 80.0 - 100.0 fL   MCH 26.2 26.0 - 34.0 pg   MCHC 31.9 30.0 - 36.0 g/dL   RDW 15.9 (H) 11.5 - 15.5 %   Platelets 228 150 - 400 K/uL   nRBC 0.0 0.0 - 0.2 %   Neutrophils Relative % 82 %   Neutro Abs 8.9 (H) 1.7 - 7.7 K/uL   Lymphocytes Relative 10 %   Lymphs Abs 1.1 0.7 - 4.0 K/uL   Monocytes Relative 6 %   Monocytes Absolute 0.6 0.1 - 1.0 K/uL   Eosinophils Relative 2 %   Eosinophils Absolute 0.2 0.0 - 0.5 K/uL   Basophils Relative 0 %   Basophils Absolute 0.0 0.0 - 0.1 K/uL   Immature Granulocytes 0 %   Abs Immature Granulocytes 0.04 0.00 - 0.07 K/uL    Comment: Performed at [facility], [address] [place], [place] [address]  Blood culture (routine x 2)     Status: None (Preliminary result)   Collection Time: 10/15/19  6:14 AM    Specimen: BLOOD RIGHT HAND  Result Value Ref Range   Specimen Description BLOOD RIGHT HAND    Special Requests      BOTTLES DRAWN AEROBIC AND ANAEROBIC Blood Culture adequate volume   Culture  NO GROWTH < 24 HOURS Performed at [facility], [address] [place], [place] [address]    Report Status PENDING   Blood culture (routine x 2)     Status: None (Preliminary result)   Collection Time: 10/15/19  6:19 AM   Specimen: Left Antecubital; Blood  Result Value Ref Range   Specimen Description LEFT ANTECUBITAL    Special Requests      BOTTLES DRAWN AEROBIC AND ANAEROBIC Blood Culture adequate volume   Culture      NO GROWTH < 24 HOURS Performed at [facility], [address] [place], [place] [address]    Report Status PENDING   SARS Coronavirus 2 by RT PCR (hospital order, performed in [place] hospital lab) Nasopharyngeal Nasopharyngeal Swab     Status: None   Collection Time: 10/15/19  7:46 AM   Specimen: Nasopharyngeal Swab  Result Value Ref Range   SARS Coronavirus 2 NEGATIVE NEGATIVE    Comment: (NOTE) SARS-CoV-2 target nucleic acids are NOT DETECTED. The SARS-CoV-2 RNA is generally detectable in upper and lower respiratory specimens during the acute phase of infection. The lowest concentration of SARS-CoV-2 viral copies this assay can detect is 250 copies / mL. A negative result does not preclude SARS-CoV-2 infection and should not be used as the sole basis for treatment or other patient management decisions.  A negative result may occur with improper specimen collection / handling, submission of specimen other than nasopharyngeal swab, presence of viral mutation(s) within the areas targeted by this assay, and inadequate number of viral copies (<250 copies / mL). A negative result must be combined with clinical observations, patient history, and epidemiological information. Fact Sheet for Patients:    StrictlyIdeas.no Fact Sheet for Healthcare Providers: BankingDealers.co.za This test is not yet approved or cleared  by the [place] FDA and has been authorized for detection and/or diagnosis of SARS-CoV-2 by FDA under an Emergency Use Authorization (EUA).  This EUA will remain in effect (meaning this test can be used) for the duration of the COVID-19 declaration under Section 564(b)(1) of the Act, 21 U.S.C. section 360bbb-3(b)(1), unless the authorization is terminated or revoked sooner. Performed at [facility], [address] [place], [place] [address]   HIV Antibody (routine testing w rflx)     Status: None   Collection Time: 10/15/19 10:09 AM  Result Value Ref Range   HIV Screen 4th Generation wRfx Non Reactive Non Reactive    Comment: Performed at [facility], [place] [address], [place], [place] [address]  MRSA PCR Screening     Status: None   Collection Time: 10/15/19  2:31 PM   Specimen: Nasal Mucosa; Nasopharyngeal  Result Value Ref Range   MRSA by PCR NEGATIVE NEGATIVE    Comment:        The GeneXpert MRSA Assay (FDA approved for NASAL specimens only), is one component of a comprehensive MRSA colonization surveillance program. It is not intended to diagnose MRSA infection nor to guide or monitor treatment for MRSA infections. Performed at [facility], [address] [place], [place] [address]   Lactic acid, plasma     Status: None   Collection Time: 10/15/19  4:04 PM  Result Value Ref Range   Lactic Acid, Venous 0.7 0.5 - 1.9 mmol/L    Comment: Performed at [facility], [address] [place], [place] [address]  Aerobic Culture (superficial specimen)     Status: None (Preliminary result)   Collection Time:  10/15/19  4:32 PM   Specimen: Eye; Wound  Result Value Ref Range   Specimen Description      EYE RIGHT EYE CELLULITIS Performed at [facility], [address] [place], [place] [address]    Special Requests NONE    Gram Stain NO WBC SEEN NO ORGANISMS SEEN     Culture      NO GROWTH < 12 HOURS Performed at [facility], [address], [place], [place] [address]    Report Status PENDING   MRSA culture     Status: None (Preliminary result)   Collection Time: 10/15/19  4:34 PM   Specimen: Nose; Body Fluid  Result Value Ref Range   Specimen Description      NOSE Performed at [facility], [address], [place], [place] [address]    Special Requests NONE    Culture      CULTURE REINCUBATED FOR BETTER GROWTH Performed at [facility], [place] [address] [place], [place] [address]    Report Status PENDING   Basic metabolic panel     Status: Abnormal   Collection Time: 10/16/19  4:29 AM  Result Value Ref Range   Sodium 143 135 - 145 mmol/L   Potassium 3.6 3.5 - 5.1 mmol/L   Chloride 117 (H) 98 - 111 mmol/L   CO2 20 (L) 22 - 32 mmol/L   Glucose, Bld 94 70 - 99 mg/dL    Comment: Glucose reference range applies only to samples taken after fasting for at least 8 hours.   BUN 15 6 - 20 mg/dL   Creatinine, Ser 1.08 (H) 0.44 - 1.00 mg/dL   Calcium 8.5 (L) 8.9 - 10.3 mg/dL   GFR calc non Af Amer 59 (L) >60 mL/min   GFR calc Af Amer >60 >60 mL/min   Anion gap 6 5 - 15    Comment: Performed at [facility], [address] [place], [place] [address]  CBC     Status: Abnormal   Collection Time: 10/16/19  4:29 AM  Result Value Ref Range   WBC 6.6 4.0 - 10.5 K/uL   RBC 3.40 (L) 3.87 - 5.11 MIL/uL   Hemoglobin 8.9 (L) 12.0 - 15.0 g/dL   HCT 28.3 (L) 36.0 - 46.0 %   MCV 83.2 80.0 - 100.0 fL   MCH 26.2 26.0 - 34.0 pg   MCHC 31.4 30.0 - 36.0 g/dL   RDW 16.4 (H) 11.5 - 15.5 %   Platelets 204 150 - 400 K/uL   nRBC 0.0 0.0 - 0.2 %    Comment: Performed at [facility], [address] [place], [place] [address]  Hepatitis panel, acute     Status: None   Collection Time: 10/16/19  4:29 AM   Result Value Ref Range   Hepatitis B Surface Ag NON REACTIVE NON REACTIVE   HCV Ab NON REACTIVE NON REACTIVE    Comment: (NOTE) Nonreactive HCV antibody screen is consistent with no HCV infections,  unless recent infection is suspected or other evidence exists to indicate HCV infection.    Hep A IgM NON REACTIVE NON REACTIVE   Hep B C IgM NON REACTIVE NON REACTIVE    Comment: Performed at [facility], [place] [address] [place], [place] [address]   CT orbits: no postseptal involvement.  No results found.  Blood pressure (!) 172/106, pulse 87, temperature 97.8 F (36.6 C), temperature source Oral, resp. rate 18, height 5'  2" (1.575 m), weight 79.8 kg, SpO2 100 %.  Mental status: Alert and Oriented x 4  Visual Acuity:  20/30+ OD  20/25+ near [place]  Pupils:  Equally round/ reactive to light.  No Afferent defect.  Motility:  Full/ orthophoric  Visual Fields:  Full to confrontation (appropriate constriction OD given lid swelling).  IOP:  Soft/tender to palpation.  External/ Lids/ Lashes:   Grossly abnormal.  Erythema, warmth, edema and tenderness in mask/malar pattern, OD>OS.  Improved per patient report and compared to photos in notes from earlier today.  Scale and flaking skin periorbit OD>OS.  Palpebral fissure OD: 2 mm with minimal digital assistance  OS: 9 mm.  Anterior Segment:  Conjunctiva:  Normal  OU, no significant chemosis OU.  Cornea:  Normal  OU  Anterior Chamber: Normal  OU  Lens:   Normal OU/good reflex.     Assessment/Plan: 1.  Preseptal/ facial orbital cellulitis, with significant improvement today on IV antibiotics (clinda, cefepime).  No orbital signs or optic neuropathy, no direct ocular involvment.  Continue broad spectrum empiric antibiotics, will defer to ID for management and transition to oral medications.  Added order for eucerin /bland emollient tid to areas of scale for comfort.  No other specific ophthalmic recommendations.     Signing  off. Ok to followup outpatient 1 month after discharge or sooner if any ophthalmic complaints.  Please reconsult if patient worsens.    Benay Pillow 10/16/2019, 5:28 PM

## 2019-10-16 NOTE — Progress Notes (Signed)
[place] [facility] [place] NAME: Teresa Jefferson    MR#:  [id number]  DATE OF BIRTH:  [birth year]  SUBJECTIVE:  patient continues to have significant pain over the right eye and right ear. She is not able to open her right eye due to significant swelling and tenderness. No fever. Complains of headache. Able to tolerate diet.  REVIEW OF SYSTEMS:   Review of Systems  Constitutional: Negative for chills, fever and weight loss.  HENT: Negative for ear discharge, ear pain and nosebleeds.   Eyes: Negative for blurred vision, pain and discharge.  Respiratory: Negative for sputum production, shortness of breath, wheezing and stridor.   Cardiovascular: Negative for chest pain, palpitations, orthopnea and PND.  Gastrointestinal: Negative for abdominal pain, diarrhea, nausea and vomiting.  Genitourinary: Negative for frequency and urgency.  Musculoskeletal: Negative for back pain and joint pain.  Neurological: Negative for sensory change, speech change, focal weakness and weakness.  Psychiatric/Behavioral: Negative for depression and hallucinations. The patient is not nervous/anxious.    Tolerating Diet:yes Tolerating PT: not needed  DRUG ALLERGIES:   Allergies  Allergen Reactions  . Amoxicillin Rash  . Effexor [Venlafaxine] Rash    "whelps"  . Ketorolac Itching  . Lamotrigine Rash  . Sulfa Antibiotics Itching    VITALS:  Blood pressure (!) 172/106, pulse 87, temperature 97.8 F (36.6 C), temperature source Oral, resp. rate 18, height 5\' 2"  (1.575 m), weight 79.8 kg, SpO2 100 %.  PHYSICAL EXAMINATION:   Physical Exam  GENERAL:  54 y.o.-year-old patient lying in the bed with no acute distress.  \HEENT:     NECK:  Supple, no jugular venous distention. No thyroid enlargement, no tenderness.  LUNGS: Normal breath sounds bilaterally, no wheezing, rales, rhonchi. No use of accessory muscles of respiration.  CARDIOVASCULAR: S1, S2 normal. No  murmurs, rubs, or gallops.  ABDOMEN: Soft, nontender, nondistended. Bowel sounds present. No organomegaly or mass.  EXTREMITIES: No cyanosis, clubbing or edema b/l.    NEUROLOGIC: Cranial nerves II through XII are intact. No focal Motor or sensory deficits b/l.   PSYCHIATRIC:  patient is alert and oriented x 3.  SKIN: No obvious rash, lesion, or ulcer.   LABORATORY PANEL:  CBC Recent Labs  Lab 10/16/19 0429  WBC 6.6  HGB 8.9*  HCT 28.3*  PLT 204    Chemistries  Recent Labs  Lab 10/15/19 0613 10/15/19 0613 10/16/19 0429  NA 137   < > 143  K 3.2*   < > 3.6  CL 110   < > 117*  CO2 19*   < > 20*  GLUCOSE 102*   < > 94  BUN 26*   < > 15  CREATININE 1.37*   < > 1.08*  CALCIUM 8.7*   < > 8.5*  AST 29  --   --   ALT 36  --   --   ALKPHOS 100  --   --   BILITOT 0.5  --   --    < > = values in this interval not displayed.   Cardiac Enzymes No results for input(s): TROPONINI in the last 168 hours. RADIOLOGY:  No results found. ASSESSMENT AND PLAN:   Teresa Jefferson is a 54 y.o. female with medical history significant for bipolar disorder, hypertension, schizophrenia and opioid dependence who presents to the emergency room for evaluation of swelling, pain/burning sensation involving the face and right ear  Facial cellulitis -With failure of outpatient antibiotic therapy --  Patient presents with worsening redness, swelling and differential warmth involving both sides of her face (Rt > Lt) with involvement of her right ear -H/o crystal meth snorting 1 day prior to this happening - ID consult appreciated recommends IV clindamycin and cefepime(to cover Pseudomonas_ -ophthalmology consultation with Dr. Benay Pillow spoke with him. -Blood culture negative, M RSA culture from the nose negative -right eye secretion culture negative -PRN Percocet and ibuprofen  Bipolar Disorder Continue Seroquel and trazodone  Hypokalemia Supplement potassium  Hypertension Continue  lisinopril  DVT prophylaxis: Lovenox Code Status: Full Code Family Communication:  none today Disposition Plan: Back to previous home environment Consults called: Infectious disease, ophthalmology   Status is: Inpatient  Remains inpatient appropriate because:IV treatments appropriate due to intensity of illness or inability to take PO   Dispo: The patient is from: Home              Anticipated d/c is to: Home              Anticipated d/c date is: 3 days              Patient currently is not medically stable to d/c. significant pain erythema and swelling over the face and IV. Needs to be seen by ophthalmology.  TOTAL TIME TAKING CARE OF THIS PATIENT: *35* minutes.  >50% time spent on counselling and coordination of care  Note: This dictation was prepared with Dragon dictation along with smaller phrase technology. Any transcriptional errors that result from this process are unintentional.  Fritzi Mandes M.D    [facility]   CC: Primary care physician; Center, [facility] HealthPatient ID: Teresa Jefferson, female   DOB: 10/06/1965, 54 y.o.   MRN: [id number]

## 2019-10-16 NOTE — Progress Notes (Signed)
   Date of Admission:  10/15/2019     ID: Teresa Jefferson is a 54 y.o. female  Principal Problem:   Facial cellulitis Active Problems:   Opiate abuse, continuous ([place])   Essential hypertension   Hypokalemia   Bipolar disorder (HCC)    Subjective: C/p pain and swelling rt side of face Unable to open eyes No fever  Medications:  . enoxaparin (LOVENOX) injection  40 mg Subcutaneous Q24H  . gabapentin  600 mg Oral TID  . lisinopril  10 mg Oral Daily  . QUEtiapine  400 mg Oral QHS  . saccharomyces boulardii  250 mg Oral BID    Objective: Vital signs in last 24 hours: Temp:  [97.6 F (36.4 C)-99.3 F (37.4 C)] 97.8 F (36.6 C) (06/02 1126) Pulse Rate:  [67-88] 87 (06/02 1126) Resp:  [16-21] 18 (06/02 1126) BP: (127-172)/(86-106) 172/106 (06/02 1126) SpO2:  [93 %-100 %] 100 % (06/02 1126)  PHYSICAL EXAM:  General: Alert, in some  distress,  Head: Normocephalic, without obvious abnormality, atraumatic. Eyes:unble to examine rt eye- eyelids closed due to swelling  ENT Nares normal. No drainage or sinus tenderness. Lips, mucosa, and tongue normal. No Thrush Neck: Supple,  Face- rt ear pinna more swollen and red 10/16/19     10/15/19      Lungs: Clear to auscultation bilaterally. No Wheezing or Rhonchi. No rales. Heart: Regular rate and rhythm, no murmur, rub or gallop. Abdomen: Soft, non-tender,not distended. Bowel sounds normal. No masses Extremities: atraumatic, no cyanosis. No edema. No clubbing Skin: No rashes or lesions. Or bruising Lymph: Cervical, supraclavicular normal. Neurologic: Grossly non-focal  Lab Results Recent Labs    10/15/19 0613 10/16/19 0429  WBC 10.9* 6.6  HGB 10.6* 8.9*  HCT 33.2* 28.3*  NA 137 143  K 3.2* 3.6  CL 110 117*  CO2 19* 20*  BUN 26* 15  CREATININE 1.37* 1.08*   Liver Panel Recent Labs    10/13/19 1259 10/15/19 0613  PROT 7.8 7.8  ALBUMIN 3.5 3.3*  AST 17 29  ALT 13 36  ALKPHOS 61 100  BILITOT 0.6 0.5    Microbiology: Blood culture neg HIV neg Hepatitis NEg  Studies/Results: No results found.   Assessment/Plan: Impression/Recommendation ?Right facial cellulitis involving the right ear and extending to the left side Risk factor- insufflation of crystal meth a day before the facial cellulitis was noted.  She had epistaxis following crystal meth use.  pt is being covered for staph and strep with cefazolin and clindamycin Because of rt  ear involvement will expand the coverage to include gram neg like pseudomonas-change cefazolin to cefepime- continue clindamycin.   Recommend ophthalmology consult Will check ANA? ?History of substance use.  Denies IV drug use  Bipolar disorder on quetiapine and trazodone  __Hypertension on lisinopril.   Discussed the management with the patient and Dr.PAtel

## 2019-10-17 LAB — MRSA CULTURE: Culture: NOT DETECTED

## 2019-10-17 MED ORDER — CEFAZOLIN SODIUM-DEXTROSE 1-4 GM/50ML-% IV SOLN
1.0000 g | Freq: Three times a day (TID) | INTRAVENOUS | Status: DC
  Filled 2019-10-17 (×2): qty 50

## 2019-10-17 MED ORDER — OXYCODONE-ACETAMINOPHEN 5-325 MG PO TABS: 1.0000 | ORAL_TABLET | Freq: Four times a day (QID) | ORAL | 0 refills | Status: DC | PRN

## 2019-10-17 MED ORDER — ALUM & MAG HYDROXIDE-SIMETH 200-200-20 MG/5ML PO SUSP: 30.0000 mL | ORAL | Status: DC | PRN

## 2019-10-17 MED ORDER — HYDROCERIN EX CREA: 1.0000 "application " | TOPICAL_CREAM | Freq: Three times a day (TID) | CUTANEOUS | 0 refills | Status: AC

## 2019-10-17 MED ORDER — CEPHALEXIN 500 MG PO CAPS: 500.0000 mg | ORAL_CAPSULE | Freq: Four times a day (QID) | ORAL | 0 refills | Status: AC

## 2019-10-17 MED ORDER — CEPHALEXIN 500 MG PO CAPS: 500.0000 mg | ORAL_CAPSULE | Freq: Four times a day (QID) | ORAL | Status: DC

## 2019-10-17 NOTE — Discharge Summary (Signed)
Teresa Jefferson NAME: Babby Bottenfield    MR#:  [id number]  DATE OF BIRTH:  1965/09/24  DATE OF ADMISSION:  10/15/2019 ADMITTING PHYSICIAN: Collier Bullock, MD  DATE OF DISCHARGE: 10/17/2019  PRIMARY CARE PHYSICIAN: [facility] [place]    ADMISSION DIAGNOSIS:  Erysipelas [id number] Uncomplicated opioid dependence ([place]) [F11.20] Facial cellulitis [id number] Failure of outpatient treatment [Z78.9]  DISCHARGE DIAGNOSIS:  Facial Cellulitis  SECONDARY DIAGNOSIS:   Past Medical History:  Diagnosis Date  . Asthma   . Bipolar 1 disorder ([place])   . Depression   . Hypertension   . Neuropathy   . Schizophrenia ([place])   . Thyroid disease     HOSPITAL COURSE:   Teresa Jefferson a 54 y.o.femalewith medical history significant forbipolar disorder, hypertension, schizophrenia and opioid dependence who presents to the emergency room for evaluation of swelling, pain/burning sensation involving the face and right ear  Facial cellulitis -With failure of outpatient antibiotic therapy --Patient presents with worsening redness, swelling and differential warmth involvingboth sides of her face (Rt > Lt) with involvement of her right ear -H/o crystal meth snorting 1 day prior to this happening - ID consult appreciated recommends IV clindamycin and cefepime--chang eto po kelfex 500 mg qid for 7 days per ID -ophthalmology consultation with Dr. Leory Plowman King--appreciate input -Blood culture negative, M RSA culture from the nose negative -right eye secretion culture rare staph aureus -PRN Percocet and ibuprofen -pt recommended to hold suboxone while taking narcotics -overall feels better. Able to open her right eye. Pain under control.  Bipolar Disorder Continue Seroquel and trazodone  Hypokalemia Supplement potassium  Hypertension Continue lisinopril  DVT prophylaxis:Lovenox Code Status:Full Code Family Communication: none  today Disposition Plan:Back to previous home environment Consults called:Infectious disease, ophthalmology   Status is: Inpatient   Dispo: The patient is from: Home  Anticipated d/c is to: Home  Anticipated d/c date is: today   CONSULTS OBTAINED:  Treatment Team:  Eulogio Bear, MD  DRUG ALLERGIES:   Allergies  Allergen Reactions  . Amoxicillin Rash  . Effexor [Venlafaxine] Rash    "whelps"  . Ketorolac Itching  . Lamotrigine Rash  . Sulfa Antibiotics Itching    DISCHARGE MEDICATIONS:   Allergies as of 10/17/2019      Reactions   Amoxicillin Rash   Effexor [venlafaxine] Rash   "whelps"   Ketorolac Itching   Lamotrigine Rash   Sulfa Antibiotics Itching      Medication List    STOP taking these medications   benzonatate 200 MG capsule Commonly known as: TESSALON   Buprenorphine HCl-Naloxone HCl 8-2 MG Film   clindamycin 300 MG capsule Commonly known as: CLEOCIN     TAKE these medications   cephALEXin 500 MG capsule Commonly known as: KEFLEX Take 1 capsule (500 mg total) by mouth 4 (four) times daily for 7 days.   gabapentin 300 MG capsule Commonly known as: NEURONTIN Take 2 capsules (600 mg total) by mouth 3 (three) times daily.   hydrocerin Crea Apply 1 application topically 3 (three) times daily.   ibuprofen 800 MG tablet Commonly known as: ADVIL Take 1 tablet (800 mg total) by mouth every 8 (eight) hours as needed for moderate pain.   lisinopril 10 MG tablet Commonly known as: ZESTRIL Take 1 tablet (10 mg total) by mouth daily.   oxyCODONE-acetaminophen 5-325 MG tablet Commonly known as: Percocet Take 1 tablet by mouth every 6 (six) hours as needed.   QUEtiapine  400 MG tablet Commonly known as: SEROquel Take 1 tablet (400 mg total) by mouth at bedtime.       If you experience worsening of your admission symptoms, develop shortness of breath, life threatening emergency, suicidal or  homicidal thoughts you must seek medical attention immediately by calling 911 or calling your MD immediately  if symptoms less severe.  You Must read complete instructions/literature along with all the possible adverse reactions/side effects for all the Medicines you take and that have been prescribed to you. Take any new Medicines after you have completely understood and accept all the possible adverse reactions/side effects.   Please note  You were cared for by a hospitalist during your hospital stay. If you have any questions about your discharge medications or the care you received while you were in the hospital after you are discharged, you can call the unit and asked to speak with the hospitalist on call if the hospitalist that took care of you is not available. Once you are discharged, your primary care physician will handle any further medical issues. Please note that NO REFILLS for any discharge medications will be authorized once you are discharged, as it is imperative that you return to your primary care physician (or establish a relationship with a primary care physician if you do not have one) for your aftercare needs so that they can reassess your need for medications and monitor your lab values. Today   SUBJECTIVE   Feels better. Able to open right eye  VITAL SIGNS:  Blood pressure (!) 156/98, pulse 84, temperature 97.8 F (36.6 C), resp. rate 18, height 5\' 2"  (1.575 m), weight 79.8 kg, SpO2 98 %.  I/O:    Intake/Output Summary (Last 24 hours) at 10/17/2019 1427 Last data filed at 10/17/2019 0900 Gross per 24 hour  Intake 523.39 ml  Output --  Net 523.39 ml    PHYSICAL EXAMINATION:  GENERAL:  54 y.o.-year-old patient lying in the bed with no acute distress.  EYES: Pupils equal, round, reactive to light and accommodation. No scleral icterus.  HEENT: Head atraumatic, normocephalic. Oropharynx and nasopharynx clear.     NECK:  Supple, no jugular venous distention. No  thyroid enlargement, no tenderness.  LUNGS: Normal breath sounds bilaterally, no wheezing, rales,rhonchi or crepitation. No use of accessory muscles of respiration.  CARDIOVASCULAR: S1, S2 normal. No murmurs, rubs, or gallops.  ABDOMEN: Soft, non-tender, non-distended. Bowel sounds present. No organomegaly or mass.  EXTREMITIES: No pedal edema, cyanosis, or clubbing.  NEUROLOGIC: Cranial nerves II through XII are intact. Muscle strength 5/5 in all extremities. Sensation intact. Gait not checked.  PSYCHIATRIC: The patient is alert and oriented x 3.  SKIN: No obvious rash, lesion, or ulcer.   DATA REVIEW:   CBC  Recent Labs  Lab 10/16/19 0429  WBC 6.6  HGB 8.9*  HCT 28.3*  PLT 204    Chemistries  Recent Labs  Lab 10/15/19 0613 10/15/19 0613 10/16/19 0429  NA 137   < > 143  K 3.2*   < > 3.6  CL 110   < > 117*  CO2 19*   < > 20*  GLUCOSE 102*   < > 94  BUN 26*   < > 15  CREATININE 1.37*   < > 1.08*  CALCIUM 8.7*   < > 8.5*  AST 29  --   --   ALT 36  --   --   ALKPHOS 100  --   --   BILITOT  0.5  --   --    < > = values in this interval not displayed.    Microbiology Results   Recent Results (from the past 240 hour(s))  Blood culture (routine x 2)     Status: None (Preliminary result)   Collection Time: 10/13/19  2:26 PM   Specimen: BLOOD  Result Value Ref Range Status   Specimen Description BLOOD LEFT ANTECUBITAL  Final   Special Requests   Final    BOTTLES DRAWN AEROBIC AND ANAEROBIC Blood Culture adequate volume   Culture   Final    NO GROWTH 4 DAYS Performed at [facility], [address] [place], [place] [address]    Report Status PENDING  Incomplete  Blood culture (routine x 2)     Status: None (Preliminary result)   Collection Time: 10/13/19  2:26 PM   Specimen: BLOOD  Result Value Ref Range Status   Specimen Description BLOOD BLOOD LEFT HAND  Final   Special Requests   Final    BOTTLES DRAWN AEROBIC AND ANAEROBIC Blood Culture adequate  volume   Culture   Final    NO GROWTH 4 DAYS Performed at [facility], [address], [place], [place] [address]    Report Status PENDING  Incomplete  Blood culture (routine x 2)     Status: None (Preliminary result)   Collection Time: 10/15/19  6:14 AM   Specimen: BLOOD RIGHT HAND  Result Value Ref Range Status   Specimen Description BLOOD RIGHT HAND  Final   Special Requests   Final    BOTTLES DRAWN AEROBIC AND ANAEROBIC Blood Culture adequate volume   Culture   Final    NO GROWTH 2 DAYS Performed at [facility], [address], [place], [place] [address]    Report Status PENDING  Incomplete  Blood culture (routine x 2)     Status: None (Preliminary result)   Collection Time: 10/15/19  6:19 AM   Specimen: Left Antecubital; Blood  Result Value Ref Range Status   Specimen Description LEFT ANTECUBITAL  Final   Special Requests   Final    BOTTLES DRAWN AEROBIC AND ANAEROBIC Blood Culture adequate volume   Culture   Final    NO GROWTH 2 DAYS Performed at [facility], [address] [place], [place] [address]    Report Status PENDING  Incomplete  SARS Coronavirus 2 by RT PCR (hospital order, performed in [place] hospital lab) Nasopharyngeal Nasopharyngeal Swab     Status: None   Collection Time: 10/15/19  7:46 AM   Specimen: Nasopharyngeal Swab  Result Value Ref Range Status   SARS Coronavirus 2 NEGATIVE NEGATIVE Final    Comment: (NOTE) SARS-CoV-2 target nucleic acids are NOT DETECTED. The SARS-CoV-2 RNA is generally detectable in upper and lower respiratory specimens during the acute phase of infection. The lowest concentration of SARS-CoV-2 viral copies this assay can detect is 250 copies / mL. A negative result does not preclude SARS-CoV-2 infection and should not be used as the sole basis for treatment or other patient management decisions.  A negative result may occur with improper specimen collection / handling, submission of  specimen other than nasopharyngeal swab, presence of viral mutation(s) within the areas targeted by this assay, and inadequate number of viral copies (<250 copies / mL). A negative result must be combined with clinical observations, patient history, and epidemiological information. Fact Sheet for Patients:   StrictlyIdeas.no Fact Sheet for Healthcare Providers: BankingDealers.co.za This test  is not yet approved or cleared  by the [place] and has been authorized for detection and/or diagnosis of SARS-CoV-2 by FDA under an Emergency Use Authorization (EUA).  This EUA will remain in effect (meaning this test can be used) for the duration of the COVID-19 declaration under Section 564(b)(1) of the Act, 21 U.S.C. section 360bbb-3(b)(1), unless the authorization is terminated or revoked sooner. Performed at [facility], [address] [place], [place] [address]   MRSA PCR Screening     Status: None   Collection Time: 10/15/19  2:31 PM   Specimen: Nasal Mucosa; Nasopharyngeal  Result Value Ref Range Status   MRSA by PCR NEGATIVE NEGATIVE Final    Comment:        The GeneXpert MRSA Assay (FDA approved for NASAL specimens only), is one component of a comprehensive MRSA colonization surveillance program. It is not intended to diagnose MRSA infection nor to guide or monitor treatment for MRSA infections. Performed at [facility], [address] [place], [place] [address]   Aerobic Culture (superficial specimen)     Status: None (Preliminary result)   Collection Time: 10/15/19  4:32 PM   Specimen: Eye; Wound  Result Value Ref Range Status   Specimen Description   Final    EYE RIGHT EYE CELLULITIS Performed at [facility], [address] [place], [place] [address]    Special Requests NONE  Final   Gram Stain NO WBC SEEN NO ORGANISMS SEEN   Final   Culture   Final    RARE STAPHYLOCOCCUS  AUREUS CULTURE REINCUBATED FOR BETTER GROWTH Performed at [facility], [place] [address], [place], [place] [address]    Report Status PENDING  Incomplete  MRSA culture     Status: None   Collection Time: 10/15/19  4:34 PM   Specimen: Nose; Body Fluid  Result Value Ref Range Status   Specimen Description   Final    NOSE Performed at [facility], [address] [place], [place] [address]    Special Requests NONE  Final   Culture   Final    NO MRSA DETECTED Performed at [facility], [place] [address], [place], [place] [address]    Report Status 10/17/2019 FINAL  Final  Aerobic Culture (superficial specimen)     Status: None (Preliminary result)   Collection Time: 10/16/19  2:31 PM   Specimen: Ear  Result Value Ref Range Status   Specimen Description   Final    EAR Performed at [facility], [address] [place], [place] [address]    Special Requests   Final    RIGHT EAR WOUND CULTURE Performed at [facility], [place], [place] [address]    Gram Stain NO WBC SEEN NO ORGANISMS SEEN   Final   Culture   Final    NO GROWTH < 12 HOURS Performed at [facility], [place] [address] [place], [place] [address]    Report Status PENDING  Incomplete    RADIOLOGY:  No results found.   CODE STATUS:     Code Status Orders  (From admission, onward)         Start     Ordered   10/15/19 0857  Full code  Continuous     10/15/19 0858        Code Status History    Date Active Date Inactive Code Status Order ID Comments User Context  12/18/2018 2235 12/21/2018 2040 Full Code [id number]  Suella Broad, FNP Inpatient   11/16/2017 1744 12/06/2017 1158 Full Code [id number]  Gonzella Lex, MD Inpatient   04/29/2017 2106 04/30/2017 1145 Full Code [id number]  Schuyler Amor, MD ED   Advance Care Planning Activity       TOTAL TIME TAKING CARE OF THIS PATIENT: 40 minutes.    Fritzi Mandes M.D  [facility]     CC: Primary care physician; [facility]

## 2019-10-17 NOTE — Discharge Instructions (Signed)
DO not take Suboxone when you are taking percocet

## 2019-10-17 NOTE — Progress Notes (Signed)
Teresa Jefferson and O x4. VSS. Pt tolerating diet well. No complaints of nausea or vomiting. IV removed intact, prescriptions given. Pt voices understanding of discharge instructions with no further questions. Patient discharged via wheelchair.  Allergies as of 10/17/2019      Reactions   Amoxicillin Rash   Effexor [venlafaxine] Rash   "whelps"   Ketorolac Itching   Lamotrigine Rash   Sulfa Antibiotics Itching      Medication List    STOP taking these medications   benzonatate 200 MG capsule Commonly known as: TESSALON   Buprenorphine HCl-Naloxone HCl 8-2 MG Film   clindamycin 300 MG capsule Commonly known as: CLEOCIN     TAKE these medications   cephALEXin 500 MG capsule Commonly known as: KEFLEX Take 1 capsule (500 mg total) by mouth 4 (four) times daily for 7 days.   gabapentin 300 MG capsule Commonly known as: NEURONTIN Take 2 capsules (600 mg total) by mouth 3 (three) times daily.   hydrocerin Crea Apply 1 application topically 3 (three) times daily.   ibuprofen 800 MG tablet Commonly known as: ADVIL Take 1 tablet (800 mg total) by mouth every 8 (eight) hours as needed for moderate pain.   lisinopril 10 MG tablet Commonly known as: ZESTRIL Take 1 tablet (10 mg total) by mouth daily.   oxyCODONE-acetaminophen 5-325 MG tablet Commonly known as: Percocet Take 1 tablet by mouth every 6 (six) hours as needed.   QUEtiapine 400 MG tablet Commonly known as: SEROquel Take 1 tablet (400 mg total) by mouth at bedtime.       Vitals:   10/17/19 0957 10/17/19 1245  BP: 125/79 (!) 156/98  Pulse:  84  Resp:  18  Temp:  97.8 F (36.6 C)  SpO2:  98%     C 

## 2019-10-18 LAB — ANA COMPREHENSIVE PANEL
Anti JO-1: <0.2 AI (ref 0.0–0.9)
Centromere Ab Screen: <0.2 AI (ref 0.0–0.9)
Chromatin Ab SerPl-aCnc: <0.2 AI (ref 0.0–0.9)
ENA SM Ab Ser-aCnc: <0.2 AI (ref 0.0–0.9)
Ribonucleic Protein: <0.2 AI (ref 0.0–0.9)
SSA (Ro) (ENA) Antibody, IgG: <0.2 AI (ref 0.0–0.9)
SSB (La) (ENA) Antibody, IgG: <0.2 AI (ref 0.0–0.9)
Scleroderma (Scl-70) (ENA) Antibody, IgG: <0.2 AI (ref 0.0–0.9)
ds DNA Ab: 1 IU/mL (ref 0–9)

## 2019-10-18 LAB — CULTURE, BLOOD (ROUTINE X 2)
Culture: NO GROWTH
Culture: NO GROWTH
Special Requests: ADEQUATE
Special Requests: ADEQUATE

## 2019-10-19 LAB — AEROBIC CULTURE W GRAM STAIN (SUPERFICIAL SPECIMEN): Gram Stain: NONE SEEN

## 2019-10-20 LAB — CULTURE, BLOOD (ROUTINE X 2)
Culture: NO GROWTH
Culture: NO GROWTH
Special Requests: ADEQUATE
Special Requests: ADEQUATE

## 2019-10-20 LAB — AEROBIC CULTURE W GRAM STAIN (SUPERFICIAL SPECIMEN): Gram Stain: NONE SEEN

## 2020-04-29 ENCOUNTER — Emergency Department
Admission: EM | Admit: 2020-04-29 | Discharge: 2020-04-29 | Disposition: A | Discharge status: Home / Self Care | Payer: Medicaid Other | Attending: Emergency Medicine | Admitting: Emergency Medicine

## 2020-04-29 ENCOUNTER — Encounter: Payer: Self-pay | Admitting: Emergency Medicine

## 2020-04-29 ENCOUNTER — Other Ambulatory Visit: Payer: Self-pay

## 2020-04-29 DIAGNOSIS — R112 Nausea with vomiting, unspecified: Secondary | ICD-10-CM | POA: Diagnosis not present

## 2020-04-29 DIAGNOSIS — R21 Rash and other nonspecific skin eruption: Secondary | ICD-10-CM | POA: Diagnosis present

## 2020-04-29 DIAGNOSIS — Z5321 Procedure and treatment not carried out due to patient leaving prior to being seen by health care provider: Secondary | ICD-10-CM | POA: Diagnosis not present

## 2020-04-29 DIAGNOSIS — L03211 Cellulitis of face: Secondary | ICD-10-CM | POA: Diagnosis not present

## 2020-04-29 LAB — CBC
HCT: 37 % (ref 36.0–46.0)
Hemoglobin: 12 g/dL (ref 12.0–15.0)
MCH: 26.5 pg (ref 26.0–34.0)
MCHC: 32.4 g/dL (ref 30.0–36.0)
MCV: 81.7 fL (ref 80.0–100.0)
Platelets: 192 10*3/uL (ref 150–400)
RBC: 4.53 MIL/uL (ref 3.87–5.11)
RDW: 15.7 % — ABNORMAL HIGH (ref 11.5–15.5)
WBC: 7.7 10*3/uL (ref 4.0–10.5)
nRBC: 0 % (ref 0.0–0.2)

## 2020-04-29 LAB — COMPREHENSIVE METABOLIC PANEL
ALT: 10 U/L (ref 0–44)
AST: 14 U/L — ABNORMAL LOW (ref 15–41)
Albumin: 3.8 g/dL (ref 3.5–5.0)
Alkaline Phosphatase: 58 U/L (ref 38–126)
Anion gap: 6 (ref 5–15)
BUN: 14 mg/dL (ref 6–20)
CO2: 25 mmol/L (ref 22–32)
Calcium: 9.2 mg/dL (ref 8.9–10.3)
Chloride: 106 mmol/L (ref 98–111)
Creatinine, Ser: 0.75 mg/dL (ref 0.44–1.00)
GFR, Estimated: >60 mL/min (ref >60)
Glucose, Bld: 126 mg/dL — ABNORMAL HIGH (ref 70–99)
Potassium: 3.3 mmol/L — ABNORMAL LOW (ref 3.5–5.1)
Sodium: 137 mmol/L (ref 135–145)
Total Bilirubin: 0.7 mg/dL (ref 0.3–1.2)
Total Protein: 8.4 g/dL — ABNORMAL HIGH (ref 6.5–8.1)

## 2020-04-29 LAB — LIPASE, BLOOD: Lipase: 24 U/L (ref 11–51)

## 2020-04-29 NOTE — ED Triage Notes (Addendum)
Pt comes into the ED via POV c/o rash to the scalp and N/V.  Pt states she was admitted a while ago for cellulitis on her face and she is concerned that is the problem she is having with her scalp. No large red swollen areas noted to the scalp at this time, small red areas seen.  Pt has even and unlabored respirations at this time and is talking in full sentences.  Pt in NAD at this time.  Pt denies any abdominal pain, but is having the N/V.

## 2020-06-03 ENCOUNTER — Emergency Department
Admission: EM | Admit: 2020-06-03 | Discharge: 2020-06-03 | Disposition: A | Discharge status: Home / Self Care | Payer: Medicaid Other | Attending: Emergency Medicine | Admitting: Emergency Medicine

## 2020-06-03 ENCOUNTER — Emergency Department: Payer: Medicaid Other

## 2020-06-03 ENCOUNTER — Other Ambulatory Visit: Payer: Self-pay

## 2020-06-03 ENCOUNTER — Encounter: Payer: Self-pay | Admitting: Emergency Medicine

## 2020-06-03 DIAGNOSIS — I10 Essential (primary) hypertension: Secondary | ICD-10-CM | POA: Diagnosis not present

## 2020-06-03 DIAGNOSIS — M5459 Other low back pain: Secondary | ICD-10-CM | POA: Diagnosis present

## 2020-06-03 DIAGNOSIS — R109 Unspecified abdominal pain: Secondary | ICD-10-CM | POA: Diagnosis not present

## 2020-06-03 DIAGNOSIS — N10 Acute tubulo-interstitial nephritis: Secondary | ICD-10-CM | POA: Diagnosis not present

## 2020-06-03 DIAGNOSIS — Z79899 Other long term (current) drug therapy: Secondary | ICD-10-CM | POA: Diagnosis not present

## 2020-06-03 DIAGNOSIS — Z20822 Contact with and (suspected) exposure to covid-19: Secondary | ICD-10-CM | POA: Insufficient documentation

## 2020-06-03 DIAGNOSIS — J45909 Unspecified asthma, uncomplicated: Secondary | ICD-10-CM | POA: Insufficient documentation

## 2020-06-03 DIAGNOSIS — R519 Headache, unspecified: Secondary | ICD-10-CM | POA: Diagnosis not present

## 2020-06-03 DIAGNOSIS — N12 Tubulo-interstitial nephritis, not specified as acute or chronic: Secondary | ICD-10-CM

## 2020-06-03 LAB — URINALYSIS, COMPLETE (UACMP) WITH MICROSCOPIC
Bilirubin Urine: NEGATIVE
Glucose, UA: NEGATIVE mg/dL
Ketones, ur: NEGATIVE mg/dL
Nitrite: POSITIVE — AB
Protein, ur: 30 mg/dL — AB
Specific Gravity, Urine: 1.012 (ref 1.005–1.030)
pH: 6 (ref 5.0–8.0)

## 2020-06-03 MED ORDER — ONDANSETRON 4 MG PO TBDP
4.0000 mg | ORAL_TABLET | Freq: Once | ORAL | Status: AC
  Administered 2020-06-03: 4 mg via ORAL
  Filled 2020-06-03: qty 1

## 2020-06-03 MED ORDER — LIDOCAINE HCL (PF) 1 % IJ SOLN
5.0000 mL | Freq: Once | INTRAMUSCULAR | Status: AC
  Administered 2020-06-03: 5 mL
  Filled 2020-06-03: qty 5

## 2020-06-03 MED ORDER — ACETAMINOPHEN 500 MG PO TABS
1000.0000 mg | ORAL_TABLET | Freq: Once | ORAL | Status: AC
  Administered 2020-06-03: 1000 mg via ORAL
  Filled 2020-06-03: qty 2

## 2020-06-03 MED ORDER — CEPHALEXIN 500 MG PO CAPS: 500.0000 mg | ORAL_CAPSULE | Freq: Four times a day (QID) | ORAL | 0 refills | Status: AC

## 2020-06-03 MED ORDER — KETOROLAC TROMETHAMINE 10 MG PO TABS: 10.0000 mg | ORAL_TABLET | Freq: Four times a day (QID) | ORAL | 0 refills | Status: AC | PRN

## 2020-06-03 MED ORDER — KETOROLAC TROMETHAMINE 30 MG/ML IJ SOLN
30.0000 mg | Freq: Once | INTRAMUSCULAR | Status: AC
  Administered 2020-06-03: 30 mg via INTRAMUSCULAR
  Filled 2020-06-03: qty 1

## 2020-06-03 MED ORDER — SODIUM CHLORIDE 0.9 % IV SOLN
1.0000 g | Freq: Once | INTRAVENOUS | Status: DC
  Filled 2020-06-03: qty 10

## 2020-06-03 MED ORDER — CEFTRIAXONE SODIUM 1 G IJ SOLR
1.0000 g | Freq: Once | INTRAMUSCULAR | Status: AC
  Administered 2020-06-03: 1 g via INTRAMUSCULAR
  Filled 2020-06-03: qty 10

## 2020-06-03 NOTE — Discharge Instructions (Signed)
Take Keflex four times daily for the next seven days.  Take Toradol up to four times daily for the next five days.

## 2020-06-03 NOTE — ED Notes (Signed)
See triage note. Pt also complains of severe low back and left flank pain, nausea and vomiting. Pt states she has not been able to hold down foods for the last 24 hours. Kennyth Lose PA at bedside.

## 2020-06-03 NOTE — ED Triage Notes (Signed)
States granddaughter is COVID positive. C/o fever and headache today.  Last medicated for fever about 6 hours ago.  AAOx3.  Skin warm and dry. NAD.  No SOB/ DOE

## 2020-06-03 NOTE — ED Notes (Signed)
E signature pad not working. Pt educated on discharge instructions and verbalized understanding.  

## 2020-06-03 NOTE — ED Provider Notes (Signed)
[facility]-EMERGENCY DEPARTMENT  ____________________________________________  Time seen: Approximately 7:26 PM  I have reviewed the triage vital signs and the nursing notes.   HISTORY  Chief Complaint Fever   Historian Patient     HPI Teresa Jefferson is a 55 y.o. female with a history of schizophrenia, hypertension and asthma, presents to the emergency department with left-sided low back pain, nausea and vomiting.  Patient states that several family members have tested positive for COVID-19.  Patient reports low-grade fever and headache today.  Patient also states that she was recently started on Suboxone and would like something stronger for pain.  She denies chest pain, chest tightness or shortness of breath.  No other alleviating measures have been attempted.   Past Medical History:  Diagnosis Date  . Asthma   . Bipolar 1 disorder ([place])   . Depression   . Hypertension   . Neuropathy   . Schizophrenia ([place] )   . Thyroid disease      Immunizations up to date:  Yes.     Past Medical History:  Diagnosis Date  . Asthma   . Bipolar 1 disorder ([place])   . Depression   . Hypertension   . Neuropathy   . Schizophrenia ([place])   . Thyroid disease     Patient Active Problem List   Diagnosis Date Noted  . Facial cellulitis 10/15/2019  . Hypokalemia 10/15/2019  . Bipolar disorder ([place]) 10/15/2019  . MDD (major depressive disorder), recurrent episode, severe ([place]) 12/18/2018  . 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 Essential hypertension 11/17/2017  . Asthma 11/17/2017  . 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 Cocaine use disorder, severe, dependence ([place]) 11/17/2017  . Opioid use disorder, severe, dependence ([place]) 11/17/2017  . Severe episode of recurrent major depressive disorder, without psychotic features ([place]) 11/16/2017  . Opiate abuse, continuous ([place]) 11/16/2017  . Cocaine abuse  ([place]) 11/16/2017  . Suicidal ideation 11/16/2017  . Chronic pain 11/16/2017    Past Surgical History:  Procedure Laterality Date  . ABDOMINAL HYSTERECTOMY    . COLONOSCOPY N/A 11/23/2017   Procedure: COLONOSCOPY;  Surgeon: Lin Landsman, MD;  Location: [facility] ENDOSCOPY;  Service: Gastroenterology;  Laterality: N/A;  . COLONOSCOPY N/A 11/24/2017   Procedure: COLONOSCOPY;  Surgeon: Lin Landsman, MD;  Location: [facility] ENDOSCOPY;  Service: Gastroenterology;  Laterality: N/A;  . KNEE SURGERY Right   . TONSILLECTOMY      Prior to Admission medications   Medication Sig Start Date End Date Taking? Authorizing Provider  cephALEXin (KEFLEX) 500 MG capsule Take 1 capsule (500 mg total) by mouth 4 (four) times daily for 7 days. 06/03/20 06/10/20 Yes Vallarie Mare M, PA-C  ketorolac (TORADOL) 10 MG tablet Take 1 tablet (10 mg total) by mouth every 6 (six) hours as needed for up to 5 days. 06/03/20 06/08/20 Yes Vallarie Mare M, PA-C  gabapentin (NEURONTIN) 300 MG capsule Take 2 capsules (600 mg total) by mouth 3 (three) times daily. 12/21/18   [name], [name], MD  hydrocerin (EUCERIN) CREA Apply 1 application topically 3 (three) times daily. 10/17/19   Fritzi Mandes, MD  ibuprofen (ADVIL) 800 MG tablet Take 1 tablet (800 mg total) by mouth every 8 (eight) hours as needed for moderate pain. 12/21/18   [name], [name], MD  lisinopril (ZESTRIL) 10 MG tablet Take 1 tablet (10 mg total) by mouth daily. 12/21/18   [name], [name], MD  oxyCODONE-acetaminophen (PERCOCET) 5-325 MG tablet Take 1  tablet by mouth every 6 (six) hours as needed. 10/17/19 10/16/20  Fritzi Mandes, MD  QUEtiapine (SEROQUEL) 400 MG tablet Take 1 tablet (400 mg total) by mouth at bedtime. 12/21/18   [name], [name], MD    Allergies Amoxicillin, Effexor [venlafaxine], Ketorolac, Lamotrigine, and Sulfa antibiotics  No family history on file.  Social History Social History   Tobacco Use  . Smoking status: Never Smoker  . Smokeless tobacco: Never  Used  Vaping Use  . Vaping Use: Never used  Substance Use Topics  . Alcohol use: Yes  . Drug use: Not Currently    Comment: crack and opiates     Review of Systems  Constitutional: Patient has fever.  Eyes:  No discharge ENT: No upper respiratory complaints. Respiratory: no cough. No SOB/ use of accessory muscles to breath Gastrointestinal:   No nausea, no vomiting.  No diarrhea.  No constipation. Genitourinary: Patient has flank pain.  Musculoskeletal: Negative for musculoskeletal pain. Neuro: Patient has headache.  Skin: Negative for rash, abrasions, lacerations, ecchymosis.  ____________________________________________   PHYSICAL EXAM:  VITAL SIGNS: ED Triage Vitals  Enc Vitals Group     BP 06/03/20 1846 (!) 152/89     Pulse Rate 06/03/20 1846 85     Resp 06/03/20 1846 16     Temp 06/03/20 1846 100.1 F (37.8 C)     Temp Source 06/03/20 1846 Oral     SpO2 06/03/20 1846 100 %     Weight 06/03/20 1815 147 lb 0.8 oz (66.7 kg)     Height 06/03/20 1815 5\' 2"  (1.575 m)     Head Circumference --      Peak Flow --      Pain Score 06/03/20 1815 8     Pain Loc --      Pain Edu? --      Excl. in [place]? --      Constitutional: Alert and oriented. Patient is lying supine. Eyes: Conjunctivae are normal. PERRL. EOMI. Head: Atraumatic. ENT:      Ears: Tympanic membranes are mildly injected with mild effusion bilaterally.       Nose: No congestion/rhinnorhea.      Mouth/Throat: Mucous membranes are moist. Posterior pharynx is mildly erythematous.  Hematological/Lymphatic/Immunilogical: No cervical lymphadenopathy.  Cardiovascular: Normal rate, regular rhythm. Normal S1 and S2.  Good peripheral circulation. Respiratory: Normal respiratory effort without tachypnea or retractions. Lungs CTAB. Good air entry to the bases with no decreased or absent breath sounds. Gastrointestinal: Bowel sounds 4 quadrants. Soft and nontender to palpation. No guarding or rigidity. No palpable  masses. No distention. No CVA tenderness. Musculoskeletal: Full range of motion to all extremities. No gross deformities appreciated. Neurologic:  Normal speech and language. No gross focal neurologic deficits are appreciated.  Skin:  Skin is warm, dry and intact. No rash noted. Psychiatric: Mood and affect are normal. Speech and behavior are normal. Patient exhibits appropriate insight and judgement.   ____________________________________________   LABS (all labs ordered are listed, but only abnormal results are displayed)  Labs Reviewed  URINALYSIS, COMPLETE (UACMP) WITH MICROSCOPIC - Abnormal; Notable for the following components:      Result Value   Color, Urine YELLOW (*)    APPearance HAZY (*)    Hgb urine dipstick SMALL (*)    Protein, ur 30 (*)    Nitrite POSITIVE (*)    Leukocytes,Ua MODERATE (*)    Bacteria, UA RARE (*)    All other components within normal limits  SARS CORONAVIRUS  2 (TAT 6-24 HRS)   ____________________________________________  EKG   ____________________________________________  RADIOLOGY   CT Renal Stone Study  Result Date: 06/03/2020 CLINICAL DATA:  Left-sided flank pain EXAM: CT ABDOMEN AND PELVIS WITHOUT CONTRAST TECHNIQUE: Multidetector CT imaging of the abdomen and pelvis was performed following the standard protocol without IV contrast. COMPARISON:  03/04/2019 FINDINGS: Lower chest: Stable left lower lobe bulla is seen. Nodule is noted in the lateral costophrenic sulcus on the right stable from the prior exam as well as stable from prior studies dating back to 2019. Hepatobiliary: No focal liver abnormality is seen. Status post cholecystectomy. No biliary dilatation. Pancreas: Unremarkable. No pancreatic ductal dilatation or surrounding inflammatory changes. Spleen: Normal in size without focal abnormality. Adrenals/Urinary Tract: Adrenal glands are within normal limits bilaterally. Kidneys demonstrate mild fullness of the collecting systems  bilaterally. The ureters appear within normal limits. No ureteral stone is seen. Mild perinephric stranding is noted bilaterally. This could be related to underlying UTI/pyelonephritis. The bladder is decompressed. Stomach/Bowel: No obstructive or inflammatory changes of the colon are seen. The appendix is well visualized and within normal limits. No small bowel or gastric abnormality is seen. Vascular/Lymphatic: No significant vascular findings are present. No enlarged abdominal or pelvic lymph nodes. Reproductive: Status post hysterectomy. No adnexal masses. Other: No abdominal wall hernia or abnormality. No abdominopelvic ascites. Musculoskeletal: Mild degenerative changes of lumbar spine are noted. No acute bony abnormality is seen. IMPRESSION: No obstructive changes are noted. Mild fullness of the collecting systems is seen with associated perinephric stranding. This may be related to underlying UTI/pyelonephritis. Correlation with laboratory values is recommended. Chronic changes in the lung bases bilaterally stable from previous exams. No other focal abnormality is seen. Electronically Signed   By: Inez Catalina M.D.   On: 06/03/2020 20:45    ____________________________________________    PROCEDURES  Procedure(s) performed:     Procedures     Medications  acetaminophen (TYLENOL) tablet 1,000 mg (1,000 mg Oral Given 06/03/20 2027)  ketorolac (TORADOL) 30 MG/ML injection 30 mg (30 mg Intramuscular Given 06/03/20 2137)  ondansetron (ZOFRAN-ODT) disintegrating tablet 4 mg (4 mg Oral Given 06/03/20 2134)  lidocaine (PF) (XYLOCAINE) 1 % injection 5 mL (5 mLs Other Given 06/03/20 2140)  cefTRIAXone (ROCEPHIN) injection 1 g (1 g Intramuscular Given 06/03/20 2137)     ____________________________________________   INITIAL IMPRESSION / ASSESSMENT AND PLAN / ED COURSE  Pertinent labs & imaging results that were available during my care of the patient were reviewed by me and considered in my  medical decision making (see chart for details).      Assessment and Plan: Flank pain Headache  55 year old female presents to the emergency department with concern for COVID-19 as she has been exposed to 2 family members who tested positive recently.  Patient also endorses flank pain that radiates around to her abdomen that has occurred for the past 2 days.  Patient was hypertensive at triage but vital signs otherwise reassuring.  Patient had findings concerning for a nitrate positive UTI on urinalysis.  CT renal stone study showed no signs of nephrolithiasis.  Patient was given Toradol and Rocephin in the emergency department and discharged with Keflex.  She was given strict return precautions to return to the emergency department with new or worsening symptoms.  All patient questions were answered.    ____________________________________________  FINAL CLINICAL IMPRESSION(S) / ED DIAGNOSES  Final diagnoses:  Pyelonephritis      NEW MEDICATIONS STARTED DURING THIS VISIT:  ED Discharge  Orders         Ordered    cephALEXin (KEFLEX) 500 MG capsule  4 times daily        06/03/20 2144    ketorolac (TORADOL) 10 MG tablet  Every 6 hours PRN        06/03/20 2145              This chart was dictated using voice recognition software/Dragon. Despite best efforts to proofread, errors can occur which can change the meaning. Any change was purely unintentional.     Lannie Fields, PA-C 06/03/20 2320    Lucrezia Starch, MD 06/03/20 [phone]

## 2020-06-04 LAB — SARS CORONAVIRUS 2 (TAT 6-24 HRS): SARS Coronavirus 2: NEGATIVE

## 2020-07-21 ENCOUNTER — Ambulatory Visit: Payer: Self-pay

## 2020-08-10 ENCOUNTER — Emergency Department
Admission: EM | Admit: 2020-08-10 | Discharge: 2020-08-10 | Disposition: A | Discharge status: Home / Self Care | Payer: Medicaid Other | Attending: Emergency Medicine | Admitting: Emergency Medicine

## 2020-08-10 ENCOUNTER — Emergency Department: Payer: Medicaid Other

## 2020-08-10 ENCOUNTER — Other Ambulatory Visit: Payer: Self-pay

## 2020-08-10 DIAGNOSIS — Z79899 Other long term (current) drug therapy: Secondary | ICD-10-CM | POA: Diagnosis not present

## 2020-08-10 DIAGNOSIS — T148XXA Other injury of unspecified body region, initial encounter: Secondary | ICD-10-CM

## 2020-08-10 DIAGNOSIS — X58XXXA Exposure to other specified factors, initial encounter: Secondary | ICD-10-CM | POA: Insufficient documentation

## 2020-08-10 DIAGNOSIS — S7012XA Contusion of left thigh, initial encounter: Secondary | ICD-10-CM | POA: Diagnosis not present

## 2020-08-10 DIAGNOSIS — J45909 Unspecified asthma, uncomplicated: Secondary | ICD-10-CM | POA: Diagnosis not present

## 2020-08-10 DIAGNOSIS — N12 Tubulo-interstitial nephritis, not specified as acute or chronic: Secondary | ICD-10-CM | POA: Diagnosis not present

## 2020-08-10 DIAGNOSIS — S79922A Unspecified injury of left thigh, initial encounter: Secondary | ICD-10-CM | POA: Diagnosis present

## 2020-08-10 DIAGNOSIS — I10 Essential (primary) hypertension: Secondary | ICD-10-CM | POA: Insufficient documentation

## 2020-08-10 DIAGNOSIS — R1032 Left lower quadrant pain: Secondary | ICD-10-CM | POA: Insufficient documentation

## 2020-08-10 LAB — COMPREHENSIVE METABOLIC PANEL
ALT: 13 U/L (ref 0–44)
AST: 19 U/L (ref 15–41)
Albumin: 4 g/dL (ref 3.5–5.0)
Alkaline Phosphatase: 49 U/L (ref 38–126)
Anion gap: 8 (ref 5–15)
BUN: 22 mg/dL — ABNORMAL HIGH (ref 6–20)
CO2: 23 mmol/L (ref 22–32)
Calcium: 9.3 mg/dL (ref 8.9–10.3)
Chloride: 109 mmol/L (ref 98–111)
Creatinine, Ser: 0.83 mg/dL (ref 0.44–1.00)
GFR, Estimated: >60 mL/min (ref >60)
Glucose, Bld: 77 mg/dL (ref 70–99)
Potassium: 3.6 mmol/L (ref 3.5–5.1)
Sodium: 140 mmol/L (ref 135–145)
Total Bilirubin: 0.5 mg/dL (ref 0.3–1.2)
Total Protein: 7.4 g/dL (ref 6.5–8.1)

## 2020-08-10 LAB — CBC WITH DIFFERENTIAL/PLATELET
Abs Immature Granulocytes: 0.01 10*3/uL (ref 0.00–0.07)
Basophils Absolute: 0 10*3/uL (ref 0.0–0.1)
Basophils Relative: 1 %
Eosinophils Absolute: 0.2 10*3/uL (ref 0.0–0.5)
Eosinophils Relative: 5 %
HCT: 38.8 % (ref 36.0–46.0)
Hemoglobin: 12.3 g/dL (ref 12.0–15.0)
Immature Granulocytes: 0 %
Lymphocytes Relative: 23 %
Lymphs Abs: 1 10*3/uL (ref 0.7–4.0)
MCH: 26.7 pg (ref 26.0–34.0)
MCHC: 31.7 g/dL (ref 30.0–36.0)
MCV: 84.2 fL (ref 80.0–100.0)
Monocytes Absolute: 0.3 10*3/uL (ref 0.1–1.0)
Monocytes Relative: 7 %
Neutro Abs: 2.7 10*3/uL (ref 1.7–7.7)
Neutrophils Relative %: 64 %
Platelets: 203 10*3/uL (ref 150–400)
RBC: 4.61 MIL/uL (ref 3.87–5.11)
RDW: 14.9 % (ref 11.5–15.5)
WBC: 4.2 10*3/uL (ref 4.0–10.5)
nRBC: 0 % (ref 0.0–0.2)

## 2020-08-10 LAB — LIPASE, BLOOD: Lipase: 37 U/L (ref 11–51)

## 2020-08-10 LAB — URINALYSIS, COMPLETE (UACMP) WITH MICROSCOPIC
Bilirubin Urine: NEGATIVE
Glucose, UA: NEGATIVE mg/dL
Ketones, ur: NEGATIVE mg/dL
Nitrite: NEGATIVE
Protein, ur: 30 mg/dL — AB
Specific Gravity, Urine: 1.024 (ref 1.005–1.030)
Squamous Epithelial / HPF: >50 — ABNORMAL HIGH (ref 0–5)
pH: 5 (ref 5.0–8.0)

## 2020-08-10 MED ORDER — HYDROCODONE-ACETAMINOPHEN 5-325 MG PO TABS: 2.0000 | ORAL_TABLET | Freq: Four times a day (QID) | ORAL | 0 refills | Status: AC | PRN

## 2020-08-10 MED ORDER — CIPROFLOXACIN HCL 500 MG PO TABS: 500.0000 mg | ORAL_TABLET | Freq: Two times a day (BID) | ORAL | 0 refills | Status: AC

## 2020-08-10 MED ORDER — IOHEXOL 300 MG/ML  SOLN
100.0000 mL | Freq: Once | INTRAMUSCULAR | Status: AC | PRN
  Administered 2020-08-10: 100 mL via INTRAVENOUS

## 2020-08-10 MED ORDER — LACTATED RINGERS IV BOLUS
1000.0000 mL | Freq: Once | INTRAVENOUS | Status: AC
  Administered 2020-08-10: 1000 mL via INTRAVENOUS

## 2020-08-10 MED ORDER — SODIUM CHLORIDE 0.9 % IV SOLN
1.0000 g | Freq: Once | INTRAVENOUS | Status: AC
  Administered 2020-08-10: 1 g via INTRAVENOUS
  Filled 2020-08-10: qty 10

## 2020-08-10 MED ORDER — MORPHINE SULFATE (PF) 4 MG/ML IV SOLN
4.0000 mg | Freq: Once | INTRAVENOUS | Status: AC
  Administered 2020-08-10: 4 mg via INTRAVENOUS
  Filled 2020-08-10: qty 1

## 2020-08-10 MED ORDER — ONDANSETRON HCL 4 MG PO TABS: 4.0000 mg | ORAL_TABLET | Freq: Three times a day (TID) | ORAL | 0 refills | Status: AC | PRN

## 2020-08-10 MED ORDER — OXYCODONE-ACETAMINOPHEN 5-325 MG PO TABS
1.0000 | ORAL_TABLET | Freq: Once | ORAL | Status: AC
  Administered 2020-08-10: 1 via ORAL
  Filled 2020-08-10: qty 1

## 2020-08-10 MED ORDER — ONDANSETRON HCL 4 MG/2ML IJ SOLN
4.0000 mg | Freq: Once | INTRAMUSCULAR | Status: AC
  Administered 2020-08-10: 4 mg via INTRAVENOUS
  Filled 2020-08-10: qty 2

## 2020-08-10 NOTE — ED Notes (Signed)
Pt eating in the lobby, advised not to eat or drink until seen by the EDP

## 2020-08-10 NOTE — ED Triage Notes (Signed)
Pt c/o left flank pain that radiates around to the abd for the past week with nausea. Pt also states "I think I have a blood clot" states she has this purple/red area to the left upper thigh this morning , denies injury.. pt is ambulatory to triage with a steady gait.

## 2020-08-10 NOTE — ED Notes (Signed)
US finished at bedside.

## 2020-08-10 NOTE — ED Notes (Signed)
Pt to CT

## 2020-08-10 NOTE — ED Provider Notes (Signed)
[facility] Emergency Department Provider Note  ____________________________________________   Event Date/Time   First MD Initiated Contact with Patient 08/10/20 0818     (approximate)  I have reviewed the triage vital signs and the nursing notes.   HISTORY  Chief Complaint Flank Pain and Leg Pain   HPI Teresa Jefferson is a 55 y.o. female with a past medical history of asthma, bipolar disorder, HTN, neuropathy, schizophrenia, substance abuse and thyroid disease who presents for assessment of approximately 7 days of worsening left lower back pain, and left lower flank pain rating to her left lower quadrant of her abdomen.  She states she has some pressure when she tries to urinate but denies burning with urination blood in her urine or vaginal discharge.  She states that today she noticed some bruising in her left upper leg and is very worried about a blood clot.  She does not recall any trauma or injuries.         Past Medical History:  Diagnosis Date  . Asthma   . Bipolar 1 disorder ([place])   . Depression   . Hypertension   . Neuropathy   . Schizophrenia ([place])   . Thyroid disease     Patient Active Problem List   Diagnosis Date Noted  . Facial cellulitis 10/15/2019  . Hypokalemia 10/15/2019  . Bipolar disorder ([place]) 10/15/2019  . MDD (major depressive disorder), recurrent episode, severe ([place]) 12/18/2018  . MDD (major depressive disorder), recurrent, severe, with psychosis ([place]) 12/05/2017  . Essential hypertension 11/17/2017  . Asthma 11/17/2017  . 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 Cocaine use disorder, severe, dependence ([place]) 11/17/2017  . Opioid use disorder, severe, dependence ([place]) 11/17/2017  . Severe episode of recurrent major depressive disorder, without psychotic features ([place]) 11/16/2017  . Opiate abuse, continuous ([place]) 11/16/2017  . Cocaine abuse ([place]) 11/16/2017  . Suicidal ideation  11/16/2017  . Chronic pain 11/16/2017    Past Surgical History:  Procedure Laterality Date  . ABDOMINAL HYSTERECTOMY    . COLONOSCOPY N/A 11/23/2017   Procedure: COLONOSCOPY;  Surgeon: Lin Landsman, MD;  Location: [facility] ENDOSCOPY;  Service: Gastroenterology;  Laterality: N/A;  . COLONOSCOPY N/A 11/24/2017   Procedure: COLONOSCOPY;  Surgeon: Lin Landsman, MD;  Location: [facility] ENDOSCOPY;  Service: Gastroenterology;  Laterality: N/A;  . KNEE SURGERY Right   . TONSILLECTOMY      Prior to Admission medications   Medication Sig Start Date End Date Taking? Authorizing Provider  ciprofloxacin (CIPRO) 500 MG tablet Take 1 tablet (500 mg total) by mouth 2 (two) times daily for 10 days. 08/10/20 08/20/20 Yes Lucrezia Starch, MD  HYDROcodone-acetaminophen (NORCO) 5-325 MG tablet Take 2 tablets by mouth every 6 (six) hours as needed for up to 3 days for severe pain. 08/10/20 08/13/20 Yes Lucrezia Starch, MD  ondansetron (ZOFRAN) 4 MG tablet Take 1 tablet (4 mg total) by mouth every 8 (eight) hours as needed for up to 10 doses for nausea or vomiting. 08/10/20  Yes Lucrezia Starch, MD  hydrocerin (EUCERIN) CREA Apply 1 application topically 3 (three) times daily. 10/17/19   Fritzi Mandes, MD  ibuprofen (ADVIL) 800 MG tablet Take 1 tablet (800 mg total) by mouth every 8 (eight) hours as needed for moderate pain. 12/21/18   [name], [name], MD  lisinopril (ZESTRIL) 10 MG tablet Take 1 tablet (10 mg total) by mouth daily. 12/21/18   [name], [name], MD  QUEtiapine (SEROQUEL) 400  MG tablet Take 1 tablet (400 mg total) by mouth at bedtime. 12/21/18   [name], [name], MD  gabapentin (NEURONTIN) 300 MG capsule Take 2 capsules (600 mg total) by mouth 3 (three) times daily. 12/21/18 08/10/20  [name], [name], MD    Allergies Amoxicillin, Effexor [venlafaxine], Ketorolac, Lamotrigine, and Sulfa antibiotics  No family history on file.  Social History Social History   Tobacco Use  . Smoking status: Never Smoker   . Smokeless tobacco: Never Used  Vaping Use  . Vaping Use: Never used  Substance Use Topics  . Alcohol use: Yes  . Drug use: Not Currently    Comment: crack and opiates    Review of Systems  Review of Systems  Constitutional: Negative for chills and fever.  HENT: Negative for sore throat.   Eyes: Negative for pain.  Respiratory: Negative for cough and stridor.   Cardiovascular: Negative for chest pain.  Gastrointestinal: Negative for vomiting.  Genitourinary: Positive for flank pain ( L ).  Musculoskeletal: Positive for myalgias ( L leg).  Skin: Negative for rash.  Neurological: Negative for seizures, loss of consciousness and headaches.  Psychiatric/Behavioral: Negative for suicidal ideas.  All other systems reviewed and are negative.     ____________________________________________   PHYSICAL EXAM:  VITAL SIGNS: ED Triage Vitals  Enc Vitals Group     BP 08/10/20 0818 (!) 184/101     Pulse Rate 08/10/20 0818 71     Resp 08/10/20 0817 16     Temp 08/10/20 0818 98 F (36.7 C)     Temp Source 08/10/20 0817 Oral     SpO2 08/10/20 0818 100 %     Weight --      Height --      Head Circumference --      Peak Flow --      Pain Score --      Pain Loc --      Pain Edu? --      Excl. in [place]? --    Vitals:   08/10/20 0848 08/10/20 1000  BP: (!) 182/96 (!) 181/97  Pulse: 86 70  Resp: 18 (!) 21  Temp:    SpO2: 100% 99%   Physical Exam Vitals and nursing note reviewed.  Constitutional:      General: She is not in acute distress.    Appearance: She is well-developed.  HENT:     Head: Normocephalic and atraumatic.     Right Ear: External ear normal.     Left Ear: External ear normal.     Nose: Nose normal.     Mouth/Throat:     Mouth: Mucous membranes are moist.  Eyes:     Conjunctiva/sclera: Conjunctivae normal.  Cardiovascular:     Rate and Rhythm: Normal rate and regular rhythm.     Heart sounds: No murmur heard.   Pulmonary:     Effort: Pulmonary  effort is normal. No respiratory distress.     Breath sounds: Normal breath sounds.  Abdominal:     Palpations: Abdomen is soft.     Tenderness: There is abdominal tenderness ( LLQ, L flank). There is left CVA tenderness. There is no right CVA tenderness.  Musculoskeletal:     Cervical back: Neck supple.     Right lower leg: No edema.     Left lower leg: No edema.  Skin:    General: Skin is warm and dry.     Capillary Refill: Capillary refill takes less than 2 seconds.  Neurological:     Mental Status: She is alert and oriented to person, place, and time.  Psychiatric:        Mood and Affect: Mood normal.      Some ecchymosis noted over the anterior medial mid left thigh.  No other obvious skin changes throughout the left lower extremity or the right lower extremity.  2+ bilateral radial and DP pulses.  Patient sensation intact throughout the left lower extremity.  There is no underlying fluctuance induration or warmth around the ecchymosis.  Bilateral hips knees and joints are unremarkable. ____________________________________________   LABS (all labs ordered are listed, but only abnormal results are displayed)  Labs Reviewed  COMPREHENSIVE METABOLIC PANEL - Abnormal; Notable for the following components:      Result Value   BUN 22 (*)    All other components within normal limits  URINALYSIS, COMPLETE (UACMP) WITH MICROSCOPIC - Abnormal; Notable for the following components:   Color, Urine YELLOW (*)    APPearance CLOUDY (*)    Hgb urine dipstick SMALL (*)    Protein, ur 30 (*)    Leukocytes,Ua LARGE (*)    Bacteria, UA RARE (*)    Squamous Epithelial / LPF >50 (*)    All other components within normal limits  URINE CULTURE  CBC WITH DIFFERENTIAL/PLATELET  LIPASE, BLOOD   ____________________________________________  EKG ____________________________________________  RADIOLOGY  ED MD interpretation: CT abdomen pelvis shows no evidence of kidney stone, abscess,  diverticulitis, or other clear acute intra-abdominal pelvic pathology.  There is some mild splenomegaly patient is known to be status post cholecystectomy and hysterectomy.  Ultrasound of the left lower extremity shows no evidence of DVT.  Official radiology report(s): CT ABDOMEN PELVIS W CONTRAST  Result Date: 08/10/2020 CLINICAL DATA:  Left flank pain for the past week with nausea EXAM: CT ABDOMEN AND PELVIS WITH CONTRAST TECHNIQUE: Multidetector CT imaging of the abdomen and pelvis was performed using the standard protocol following bolus administration of intravenous contrast. CONTRAST:  122mL OMNIPAQUE IOHEXOL 300 MG/ML  SOLN COMPARISON:  06/03/2020 FINDINGS: Lower chest: No acute abnormality. Hepatobiliary: No focal liver abnormality is seen. Status post cholecystectomy. No biliary dilatation. Pancreas: Unremarkable. No pancreatic ductal dilatation or surrounding inflammatory changes. Spleen: Mild splenomegaly, maximum coronal span 13.9 cm. Adrenals/Urinary Tract: Adrenal glands are unremarkable. Kidneys are normal, without renal calculi, solid lesion, or hydronephrosis. Bladder is unremarkable. Stomach/Bowel: Stomach is within normal limits. Appendix appears normal. No evidence of bowel wall thickening, distention, or inflammatory changes. Sigmoid diverticulosis. Vascular/Lymphatic: No significant vascular findings are present. No enlarged abdominal or pelvic lymph nodes. Reproductive: Status post hysterectomy. Other: No abdominal wall hernia or abnormality. No abdominopelvic ascites. Musculoskeletal: No acute or significant osseous findings. IMPRESSION: 1. No CT findings of the abdomen or pelvis to explain left-sided flank pain. Specifically no evidence of urinary tract calculus or hydronephrosis. 2. Sigmoid diverticulosis without evidence of acute diverticulitis. 3. Mild splenomegaly, maximum coronal span 13.9 cm. 4. Status post cholecystectomy and hysterectomy. Electronically Signed   By: Eddie Candle  M.D.   On: 08/10/2020 10:35   US Venous Img Lower Unilateral Left  Result Date: 08/10/2020 CLINICAL DATA:  Discoloration and pain of left lower extremity. EXAM: LEFT LOWER EXTREMITY VENOUS DOPPLER ULTRASOUND TECHNIQUE: Gray-scale sonography with compression, as well as color and duplex ultrasound, were performed to evaluate the deep venous system(s) from the level of the common femoral vein through the popliteal and proximal calf veins. COMPARISON:  None. FINDINGS: VENOUS Normal compressibility of the common  femoral, superficial femoral, and popliteal veins, as well as the visualized calf veins. Visualized portions of profunda femoral vein and great saphenous vein unremarkable. No filling defects to suggest DVT on grayscale or color Doppler imaging. Doppler waveforms show normal direction of venous flow, normal respiratory plasticity and response to augmentation. Limited views of the contralateral common femoral vein are unremarkable. OTHER None. Limitations: none IMPRESSION: No evidence of left lower extremity deep venous thrombosis. Electronically Signed   By: Abigail Miyamoto M.D.   On: 08/10/2020 09:45    ____________________________________________   PROCEDURES  Procedure(s) performed (including Critical Care):  Procedures   ____________________________________________   INITIAL IMPRESSION / ASSESSMENT AND PLAN / ED COURSE      Patient presents with above to history exam for assessment of some left lower quadrant abdominal pain, flank pain and lower left back pain getting worse over approximately 7 days as well as some bruising she noted on her upper left thigh today.  On arrival she is hypertensive with a BP of 184/101 with otherwise stable vital signs on room air.  She is tender in her left CVA area, left flank and left lower quadrant of abdomen.  Does have some bruising noted to the left proximal mid thigh although there are no other surrounding skin changes or evidence of neurovascular  compromise throughout the extremity.  With regard to ecchymosis noted over the thigh to certainly possible patient contused this area does not recall possibly also is asleep.  There is no evidence on exam to suggest cellulitis.  Ultrasound shows no evidence of DVT.  She is neurovascular intact distally.  Low suspicion for other immediate life or limb threatening pathology at this time.  With [place] to her flank pain and abdominal pain differential includes kidney stone, pyelonephritis, diverticulitis, pancreatitis, and cystitis.  Patient is status post cholecystectomy and will suspicion for acute biliary obstruction.  CT obtained shows no evidence of stone, diverticulitis, pancreatitis or other clear acute abdominal pelvic pathology.  In addition there is no evidence of hydroperinephric stranding however given CVA tenderness and UA concerning for infection with some blood, protein and large LE S and 21-50 WBCs with rare bacteria and concern for possible pyelonephritis.  Lipase is also within normal limits and not consistent with pancreatitis.  CMP shows no significant electrolyte or metabolic derangements.  CBC shows no leukocytosis or acute anemia.  Given absence of fever, elevated white blood cell count, hypotension, tachycardia or other concerning exam features of low suspicion for bacteremia or sepsis.  Patient treated with analgesia of Zofran and antiemetics.  She felt much better on reassessment.  Given stable vitals otherwise reassuring exam and work-up and patient tolerating p.o. I believe she is appropriate for outpatient follow-up.  Rx written for Cipro, Norco and Zofran.  Discharged stable condition.  Strict term precautions advised and discussed  ___________________________________   FINAL CLINICAL IMPRESSION(S) / ED DIAGNOSES  Final diagnoses:  Pyelonephritis  Bruise    Medications  lactated ringers bolus 1,000 mL (0 mLs Intravenous Stopped 08/10/20 1035)  ondansetron (ZOFRAN)  injection 4 mg (4 mg Intravenous Given 08/10/20 0834)  morphine 4 MG/ML injection 4 mg (4 mg Intravenous Given 08/10/20 0834)  iohexol (OMNIPAQUE) 300 MG/ML solution 100 mL (100 mLs Intravenous Contrast Given 08/10/20 1017)  cefTRIAXone (ROCEPHIN) 1 g in sodium chloride 0.9 % 100 mL IVPB (0 g Intravenous Stopped 08/10/20 1058)  oxyCODONE-acetaminophen (PERCOCET/ROXICET) 5-325 MG per tablet 1 tablet (1 tablet Oral Given 08/10/20 1035)     ED Discharge  Orders         Ordered    ciprofloxacin (CIPRO) 500 MG tablet  2 times daily        08/10/20 1048    ondansetron (ZOFRAN) 4 MG tablet  Every 8 hours PRN        08/10/20 1048    HYDROcodone-acetaminophen (NORCO) 5-325 MG tablet  Every 6 hours PRN        08/10/20 1049           Note:  This document was prepared using Dragon voice recognition software and may include unintentional dictation errors.   Lucrezia Starch, MD 08/10/20 [phone]

## 2020-08-10 NOTE — ED Notes (Signed)
Pt alert and oriented X 4, stable for discharge. RR even and unlabored, color WNL. Discussed discharge instructions and follow-up as directed. Discharge medications discussed if prescribed. Pt had opportunity to ask questions, and RN to provide patient/family eduction. Pt verbalizes understanding of use of nausea medications so she can take her normal medications including her normal BP meds. Left with family. Ambulates safely

## 2020-08-10 NOTE — ED Notes (Signed)
Pt reports she has missed the last few days of her BP medications due to not feeling well enough to take them.

## 2020-08-11 LAB — URINE CULTURE

## 2020-11-26 ENCOUNTER — Emergency Department
Admission: EM | Admit: 2020-11-26 | Discharge: 2020-11-26 | Disposition: A | Discharge status: Home / Self Care | Payer: Medicaid Other | Attending: Emergency Medicine | Admitting: Emergency Medicine

## 2020-11-26 ENCOUNTER — Encounter: Payer: Self-pay | Admitting: *Deleted

## 2020-11-26 ENCOUNTER — Emergency Department: Payer: Medicaid Other

## 2020-11-26 ENCOUNTER — Other Ambulatory Visit: Payer: Self-pay

## 2020-11-26 DIAGNOSIS — W19XXXA Unspecified fall, initial encounter: Secondary | ICD-10-CM | POA: Insufficient documentation

## 2020-11-26 DIAGNOSIS — Z79899 Other long term (current) drug therapy: Secondary | ICD-10-CM | POA: Diagnosis not present

## 2020-11-26 DIAGNOSIS — J45909 Unspecified asthma, uncomplicated: Secondary | ICD-10-CM | POA: Diagnosis not present

## 2020-11-26 DIAGNOSIS — M25561 Pain in right knee: Secondary | ICD-10-CM | POA: Diagnosis present

## 2020-11-26 DIAGNOSIS — I10 Essential (primary) hypertension: Secondary | ICD-10-CM

## 2020-11-26 DIAGNOSIS — M1711 Unilateral primary osteoarthritis, right knee: Secondary | ICD-10-CM | POA: Insufficient documentation

## 2020-11-26 MED ORDER — HYDROCODONE-ACETAMINOPHEN 5-325 MG PO TABS
1.0000 | ORAL_TABLET | Freq: Once | ORAL | Status: AC
  Administered 2020-11-26: 1 via ORAL
  Filled 2020-11-26: qty 1

## 2020-11-26 MED ORDER — IBUPROFEN 800 MG PO TABS: 800.0000 mg | ORAL_TABLET | Freq: Three times a day (TID) | ORAL | 1 refills | Status: DC | PRN

## 2020-11-26 NOTE — ED Notes (Signed)
Pt and family member out of room x3 times requesting pain medicine. Provider made aware.

## 2020-11-26 NOTE — ED Notes (Signed)
See triage note for CC. Pt ambulatory to the room & endorses right knee pain after a fall.

## 2020-11-26 NOTE — ED Notes (Signed)
Patient approached writer asking how much longer until she gets some pain medicine, its been a hour since she seen provider and she really hurting
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
## 2020-12-30 ENCOUNTER — Encounter: Payer: Self-pay | Admitting: Emergency Medicine

## 2020-12-30 ENCOUNTER — Emergency Department
Admission: EM | Admit: 2020-12-30 | Discharge: 2020-12-30 | Disposition: A | Discharge status: Home / Self Care | Payer: Medicaid Other | Attending: Emergency Medicine | Admitting: Emergency Medicine

## 2020-12-30 ENCOUNTER — Other Ambulatory Visit: Payer: Self-pay

## 2020-12-30 DIAGNOSIS — Z5321 Procedure and treatment not carried out due to patient leaving prior to being seen by health care provider: Secondary | ICD-10-CM | POA: Insufficient documentation

## 2020-12-30 DIAGNOSIS — Y92410 Unspecified street and highway as the place of occurrence of the external cause: Secondary | ICD-10-CM | POA: Diagnosis not present

## 2020-12-30 DIAGNOSIS — M25561 Pain in right knee: Secondary | ICD-10-CM | POA: Insufficient documentation
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
## 2020-12-31 ENCOUNTER — Encounter: Payer: Self-pay | Admitting: Emergency Medicine

## 2020-12-31 ENCOUNTER — Emergency Department
Admission: EM | Admit: 2020-12-31 | Discharge: 2020-12-31 | Disposition: A | Discharge status: Home / Self Care | Payer: Medicaid Other | Attending: Emergency Medicine | Admitting: Emergency Medicine

## 2020-12-31 ENCOUNTER — Other Ambulatory Visit: Payer: Self-pay

## 2020-12-31 ENCOUNTER — Emergency Department: Payer: Medicaid Other

## 2020-12-31 DIAGNOSIS — I10 Essential (primary) hypertension: Secondary | ICD-10-CM | POA: Diagnosis not present

## 2020-12-31 DIAGNOSIS — M13861 Other specified arthritis, right knee: Secondary | ICD-10-CM | POA: Insufficient documentation

## 2020-12-31 DIAGNOSIS — J45909 Unspecified asthma, uncomplicated: Secondary | ICD-10-CM | POA: Diagnosis not present

## 2020-12-31 DIAGNOSIS — M1711 Unilateral primary osteoarthritis, right knee: Secondary | ICD-10-CM

## 2020-12-31 DIAGNOSIS — M25561 Pain in right knee: Secondary | ICD-10-CM | POA: Diagnosis present

## 2020-12-31 DIAGNOSIS — M7121 Synovial cyst of popliteal space [Baker], right knee: Secondary | ICD-10-CM | POA: Insufficient documentation

## 2020-12-31 DIAGNOSIS — Z79899 Other long term (current) drug therapy: Secondary | ICD-10-CM | POA: Insufficient documentation

## 2020-12-31 DIAGNOSIS — M7989 Other specified soft tissue disorders: Secondary | ICD-10-CM | POA: Insufficient documentation

## 2020-12-31 MED ORDER — KETOROLAC TROMETHAMINE 30 MG/ML IJ SOLN
30.0000 mg | Freq: Once | INTRAMUSCULAR | Status: AC
  Administered 2020-12-31: 30 mg via INTRAMUSCULAR
  Filled 2020-12-31: qty 1

## 2020-12-31 MED ORDER — LIDOCAINE 5 % EX PTCH
1.0000 | MEDICATED_PATCH | CUTANEOUS | Status: DC
  Administered 2020-12-31: 1 via TRANSDERMAL
  Filled 2020-12-31: qty 1

## 2020-12-31 MED ORDER — OXYCODONE-ACETAMINOPHEN 5-325 MG PO TABS
1.0000 | ORAL_TABLET | Freq: Once | ORAL | Status: AC
Start: 2020-12-31 — End: 2020-12-31
  Administered 2020-12-31: 1 via ORAL
  Filled 2020-12-31: qty 1

## 2020-12-31 MED ORDER — ACETAMINOPHEN 325 MG PO TABS
650.0000 mg | ORAL_TABLET | Freq: Once | ORAL | Status: AC
  Administered 2020-12-31: 650 mg via ORAL
  Filled 2020-12-31: qty 2

## 2020-12-31 MED ORDER — LIDOCAINE 4 % EX PTCH: 1.0000 | MEDICATED_PATCH | Freq: Two times a day (BID) | CUTANEOUS | 0 refills | Status: DC
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
## 2020-12-31 NOTE — Discharge Instructions (Addendum)
Please keep your orthopedics appointment for your knee that is already scheduled on the 22nd.  You have been prescribed a cane for assistance with ambulation as needed.  Please continue to use the brace when ambulatory.  Please continue to apply ice, use Tylenol up to 1000 mg 4 times daily as needed for pain.  You may also continue the Mobic that you have previously prescribed.  Lidocaine patches have been sent to your pharmacy, however they are not always covered by insurance.  You may apply 1 to the affected area every 12 hours as needed for pain.  Please return to the emergency department if you experience any worsening of symptoms.
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
## 2020-12-31 NOTE — ED Triage Notes (Signed)
Pt called for triage, no response. 

## 2021-01-11 ENCOUNTER — Telehealth (INDEPENDENT_AMBULATORY_CARE_PROVIDER_SITE_OTHER): Payer: Self-pay | Admitting: Gastroenterology

## 2021-01-11 DIAGNOSIS — Z800 Family history of malignant neoplasm of digestive organs: Secondary | ICD-10-CM

## 2021-01-11 DIAGNOSIS — Z86010 Personal history of colonic polyps: Secondary | ICD-10-CM

## 2021-01-11 MED ORDER — NA SULFATE-K SULFATE-MG SULF 17.5-3.13-1.6 GM/177ML PO SOLN: 1.0000 | Freq: Once | ORAL | 0 refills | Status: AC

## 2021-01-11 NOTE — Progress Notes (Signed)
Gastroenterology Pre-Procedure Review  Request Date: 01/26/21 Requesting Physician: Dr. Marius Ditch  PATIENT REVIEW QUESTIONS: The patient responded to the following health history questions as indicated:    1. Are you having any GI issues? no 2. Do you have a personal history of Polyps? yes (11/24/2017) 3. Do you have a family history of Colon Cancer or Polyps? yes (parental aunt colon cancer) 4. Diabetes Mellitus? no 5. Joint replacements in the past 12 months?no 6. Major health problems in the past 3 months?no 7. Any artificial heart valves, MVP, or defibrillator?no    MEDICATIONS & ALLERGIES:    Patient reports the following regarding taking any anticoagulation/antiplatelet therapy:   Plavix, Coumadin, Eliquis, Xarelto, Lovenox, Pradaxa, Brilinta, or Effient? no Aspirin? no  Patient confirms/reports the following medications:  Current Outpatient Medications  Medication Sig Dispense Refill   hydrocerin (EUCERIN) CREA Apply 1 application topically 3 (three) times daily. 113 g 0   ibuprofen (ADVIL) 800 MG tablet Take 1 tablet (800 mg total) by mouth every 8 (eight) hours as needed for moderate pain. 60 tablet 1   Lidocaine (HM LIDOCAINE PATCH) 4 % PTCH Apply 1 patch topically every 12 (twelve) hours. 10 patch 0   lisinopril (ZESTRIL) 10 MG tablet Take 1 tablet (10 mg total) by mouth daily. 30 tablet 1   ondansetron (ZOFRAN) 4 MG tablet Take 1 tablet (4 mg total) by mouth every 8 (eight) hours as needed for up to 10 doses for nausea or vomiting. 10 tablet 0   QUEtiapine (SEROQUEL) 400 MG tablet Take 1 tablet (400 mg total) by mouth at bedtime. 30 tablet 1   No current facility-administered medications for this visit.    Patient confirms/reports the following allergies:  Allergies  Allergen Reactions   Amoxicillin Rash   Effexor [Venlafaxine] Rash    "whelps"   Ketorolac Itching   Lamotrigine Rash   Sulfa Antibiotics Itching    No orders of the defined types were placed in this  encounter.   AUTHORIZATION INFORMATION Primary Insurance: 1D#: Group #:  Secondary Insurance: 1D#: Group #:  SCHEDULE INFORMATION: Date: 01/26/21 Time: Location: [place]

## 2021-01-25 ENCOUNTER — Encounter: Payer: Self-pay | Admitting: Gastroenterology

## 2021-01-26 ENCOUNTER — Encounter: Admission: RE | Payer: Self-pay | Source: Home / Self Care

## 2021-01-26 ENCOUNTER — Ambulatory Visit: Admission: RE | Admit: 2021-01-26 | Payer: Medicaid Other | Source: Home / Self Care | Admitting: Gastroenterology

## 2021-01-26 SURGERY — COLONOSCOPY WITH PROPOFOL
Anesthesia: General

## 2021-02-25 ENCOUNTER — Emergency Department
Admission: EM | Admit: 2021-02-25 | Discharge: 2021-02-25 | Disposition: A | Discharge status: Home / Self Care | Payer: Medicaid Other | Attending: Emergency Medicine | Admitting: Emergency Medicine

## 2021-02-25 ENCOUNTER — Emergency Department: Payer: Medicaid Other

## 2021-02-25 ENCOUNTER — Encounter: Payer: Self-pay | Admitting: Emergency Medicine

## 2021-02-25 ENCOUNTER — Other Ambulatory Visit: Payer: Self-pay

## 2021-02-25 DIAGNOSIS — M79604 Pain in right leg: Secondary | ICD-10-CM

## 2021-02-25 DIAGNOSIS — Z79899 Other long term (current) drug therapy: Secondary | ICD-10-CM | POA: Insufficient documentation

## 2021-02-25 DIAGNOSIS — M79605 Pain in left leg: Secondary | ICD-10-CM

## 2021-02-25 DIAGNOSIS — I10 Essential (primary) hypertension: Secondary | ICD-10-CM | POA: Insufficient documentation

## 2021-02-25 DIAGNOSIS — R600 Localized edema: Secondary | ICD-10-CM | POA: Diagnosis not present

## 2021-02-25 DIAGNOSIS — Z5321 Procedure and treatment not carried out due to patient leaving prior to being seen by health care provider: Secondary | ICD-10-CM | POA: Insufficient documentation

## 2021-02-25 DIAGNOSIS — M79662 Pain in left lower leg: Secondary | ICD-10-CM | POA: Insufficient documentation

## 2021-02-25 DIAGNOSIS — M79661 Pain in right lower leg: Secondary | ICD-10-CM | POA: Insufficient documentation

## 2021-02-25 DIAGNOSIS — J45909 Unspecified asthma, uncomplicated: Secondary | ICD-10-CM | POA: Insufficient documentation

## 2021-02-25 LAB — URINE DRUG SCREEN, QUALITATIVE (ARMC ONLY)
Amphetamines, Ur Screen: NOT DETECTED
Barbiturates, Ur Screen: NOT DETECTED
Benzodiazepine, Ur Scrn: POSITIVE — AB
Cannabinoid 50 Ng, Ur ~~LOC~~: NOT DETECTED
Cocaine Metabolite,Ur ~~LOC~~: NOT DETECTED
MDMA (Ecstasy)Ur Screen: NOT DETECTED
Methadone Scn, Ur: NOT DETECTED
Opiate, Ur Screen: POSITIVE — AB
Phencyclidine (PCP) Ur S: NOT DETECTED
Tricyclic, Ur Screen: NOT DETECTED

## 2021-02-25 LAB — COMPREHENSIVE METABOLIC PANEL
ALT: 13 U/L (ref 0–44)
AST: 17 U/L (ref 15–41)
Albumin: 4.1 g/dL (ref 3.5–5.0)
Alkaline Phosphatase: 60 U/L (ref 38–126)
Anion gap: 7 (ref 5–15)
BUN: 20 mg/dL (ref 6–20)
CO2: 28 mmol/L (ref 22–32)
Calcium: 9.1 mg/dL (ref 8.9–10.3)
Chloride: 108 mmol/L (ref 98–111)
Creatinine, Ser: 0.9 mg/dL (ref 0.44–1.00)
GFR, Estimated: >60 mL/min (ref >60)
Glucose, Bld: 113 mg/dL — ABNORMAL HIGH (ref 70–99)
Potassium: 3.8 mmol/L (ref 3.5–5.1)
Sodium: 143 mmol/L (ref 135–145)
Total Bilirubin: 0.5 mg/dL (ref 0.3–1.2)
Total Protein: 7.4 g/dL (ref 6.5–8.1)

## 2021-02-25 LAB — URINALYSIS, ROUTINE W REFLEX MICROSCOPIC
Bilirubin Urine: NEGATIVE
Glucose, UA: NEGATIVE mg/dL
Hgb urine dipstick: NEGATIVE
Ketones, ur: NEGATIVE mg/dL
Nitrite: NEGATIVE
Protein, ur: 30 mg/dL — AB
Specific Gravity, Urine: 1.028 (ref 1.005–1.030)
pH: 5 (ref 5.0–8.0)

## 2021-02-25 LAB — CBC WITH DIFFERENTIAL/PLATELET
Abs Immature Granulocytes: 0.01 10*3/uL (ref 0.00–0.07)
Basophils Absolute: 0.1 10*3/uL (ref 0.0–0.1)
Basophils Relative: 1 %
Eosinophils Absolute: 0.4 10*3/uL (ref 0.0–0.5)
Eosinophils Relative: 8 %
HCT: 38.4 % (ref 36.0–46.0)
Hemoglobin: 12.6 g/dL (ref 12.0–15.0)
Immature Granulocytes: 0 %
Lymphocytes Relative: 19 %
Lymphs Abs: 0.8 10*3/uL (ref 0.7–4.0)
MCH: 29.5 pg (ref 26.0–34.0)
MCHC: 32.8 g/dL (ref 30.0–36.0)
MCV: 89.9 fL (ref 80.0–100.0)
Monocytes Absolute: 0.3 10*3/uL (ref 0.1–1.0)
Monocytes Relative: 7 %
Neutro Abs: 2.9 10*3/uL (ref 1.7–7.7)
Neutrophils Relative %: 65 %
Platelets: 182 10*3/uL (ref 150–400)
RBC: 4.27 MIL/uL (ref 3.87–5.11)
RDW: 13.2 % (ref 11.5–15.5)
WBC: 4.4 10*3/uL (ref 4.0–10.5)
nRBC: 0 % (ref 0.0–0.2)

## 2021-02-25 LAB — BRAIN NATRIURETIC PEPTIDE: B Natriuretic Peptide: 74.9 pg/mL (ref 0.0–100.0)

## 2021-02-25 MED ORDER — CYCLOBENZAPRINE HCL 10 MG PO TABS
10.0000 mg | ORAL_TABLET | Freq: Once | ORAL | Status: AC
  Administered 2021-02-25: 10 mg via ORAL
  Filled 2021-02-25: qty 1

## 2021-02-25 NOTE — ED Triage Notes (Signed)
C/o bilateral knee pain x 2-3 weeks.  Patient states knees have chronically hurt since the mid 1990's  AAOx3. Skin warm and dry. NAD. Ambulates with easy and steady gait.

## 2021-02-25 NOTE — ED Provider Notes (Signed)
HPI: Pt is a 55 y.o. female who presents with complaints of leg swelling and leg pain.  The patient p/w  chronic knee pain but now with swelling new for 1.5 weeks. No new injuries no sob no chest pain   ROS: Denies fever, chest pain, vomiting  Past Medical History:  Diagnosis Date   Asthma    Bipolar 1 disorder (HCC)    Depression    Hypertension    Neuropathy    Schizophrenia ([place])    Thyroid disease    Vitals:   02/25/21 0836  BP: (!) 205/95  Pulse: 74  Resp: (!) 21  Temp: 98.3 F (36.8 C)  SpO2: 96%    Focused Physical Exam: Gen: No acute distress Head: atraumatic, normocephalic Eyes: Extraocular movements grossly intact; conjunctiva clear CV: RRR Lung: No increased WOB, no stridor GI: ND, no obvious masses Neuro: Alert and awake Msk: Left greater then Right swelling 1+ edema bilateral 2+ distal pulse, no redness of warmth   Medical Decision Making and Plan: Given the patient's initial medical screening exam, the following diagnostic evaluation has been ordered. The patient will be placed in the appropriate treatment space, once one is available, to complete the evaluation and treatment. I have discussed the plan of care with the patient and I have advised the patient that an ED physician or mid-level practitioner will reevaluate their condition after the test results have been received, as the results may give them additional insight into the type of treatment they may need.   Diagnostics: Us/labs   Treatments: none immediately   Vanessa Orland, MD 02/25/21 [phone]

## 2021-02-25 NOTE — ED Notes (Signed)
Pt came to door  asking about how long before the u/s   This nurse called to f/u  informed pt that it would be about 15 mins

## 2021-02-25 NOTE — ED Notes (Signed)
See triage note  presents with bilateral knee pain   states pain with some swelling started about 1 1/2 weeks ago  denies any injury  no SOB

## 2021-02-25 NOTE — ED Provider Notes (Signed)
[facility] Emergency Department Provider Note ____________________________________________  Time seen: Approximately 9:26 AM  I have reviewed the triage vital signs and the nursing notes.   HISTORY  Chief Complaint Knee Pain    HPI Teresa Jefferson is a 55 y.o. female  with history of polysubstance abuse, schizophrenia, hypertension and remaining history as below who presents to the emergency department for evaluation and treatment of bilateral leg pain. Symptoms are chronic, but has worsened over the past 1.5 weeks. Pain not improving with gabapentin and compression stockings.    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   Cocaine use disorder, severe, dependence ([place]) 11/17/2017   Opioid use disorder, severe, dependence ([place]) 11/17/2017   Severe episode of recurrent major depressive disorder, without psychotic features ([place]) 11/16/2017   Opiate abuse, continuous ([place]) 11/16/2017   Cocaine abuse ([place]) 11/16/2017   Suicidal ideation 11/16/2017   Chronic pain 11/16/2017    Past Surgical History:  Procedure Laterality Date   ABDOMINAL HYSTERECTOMY     COLONOSCOPY N/A 11/23/2017   Procedure: COLONOSCOPY;  Surgeon: Lin Landsman, MD;  Location: [facility] ENDOSCOPY;  Service: Gastroenterology;  Laterality: N/A;   COLONOSCOPY N/A 11/24/2017   Procedure: COLONOSCOPY;  Surgeon: Lin Landsman, MD;   Location: [facility] ENDOSCOPY;  Service: Gastroenterology;  Laterality: N/A;   KNEE SURGERY Right    TONSILLECTOMY      Prior to Admission medications   Medication Sig Start Date End Date Taking? Authorizing Provider  hydrocerin (EUCERIN) CREA Apply 1 application topically 3 (three) times daily. Patient not taking: Reported on 01/11/2021 10/17/19   Fritzi Mandes, MD  HYDROcodone-acetaminophen (NORCO/VICODIN) 5-325 MG tablet Take 1 tablet by mouth 2 (two) times daily as needed. 12/14/20   [provider]  ibuprofen (ADVIL) 800 MG tablet Take 1 tablet (800 mg total) by mouth every 8 (eight) hours as needed for moderate pain. 11/26/20   ,  B, FNP  Lidocaine (HM LIDOCAINE PATCH) 4 % PTCH Apply 1 patch topically every 12 (twelve) hours. Patient not taking: Reported on 01/11/2021 12/31/20   Marlana Salvage, PA  lisinopril (ZESTRIL) 10 MG tablet Take 1 tablet (10 mg total) by mouth daily. 12/21/18   [name], [name], MD  ondansetron (ZOFRAN) 4 MG tablet Take 1 tablet (4 mg total) by mouth every 8 (eight) hours as needed for up to 10 doses for nausea or vomiting. 08/10/20   Lucrezia Starch, MD  QUEtiapine (SEROQUEL) 400 MG tablet Take 1 tablet (400 mg total) by mouth at bedtime. Patient not taking: Reported on 01/11/2021 12/21/18   [name], [name], MD  gabapentin (NEURONTIN) 300 MG capsule Take 2 capsules (600 mg total) by mouth 3 (three) times daily. 12/21/18 08/10/20  [name], [name], MD    Allergies Amoxicillin, Effexor [venlafaxine], Ketorolac, Lamotrigine, and Sulfa antibiotics  No family history on file.  Social History Social History   Tobacco Use  Smoking status: Never   Smokeless tobacco: Never  Vaping Use   Vaping Use: Never used  Substance Use Topics   Alcohol use: Yes   Drug use: Not Currently    Comment: crack and opiates    Review of Systems Constitutional: Negative for fever. Cardiovascular: Negative for chest pain. Respiratory: Negative for shortness of  breath. Musculoskeletal: Positive for bilateral lower extremity pain. Skin: Negative for open wounds or lesions.  Neurological: Negative for decrease in sensation  ____________________________________________   PHYSICAL EXAM:  VITAL SIGNS: ED Triage Vitals  Enc Vitals Group     BP 02/25/21 0836 (!) 205/95     Pulse Rate 02/25/21 0836 74     Resp 02/25/21 0836 (!) 21     Temp 02/25/21 0836 98.3 F (36.8 C)     Temp Source 02/25/21 0836 Oral     SpO2 02/25/21 0836 96 %     Weight 02/25/21 0830 149 lb 14.6 oz (68 kg)     Height 02/25/21 0830 5\' 2"  (1.575 m)     Head Circumference --      Peak Flow --      Pain Score 02/25/21 0830 6     Pain Loc --      Pain Edu? --      Excl. in [place]? --     Constitutional: Alert and oriented. Well appearing and in no acute distress. Eyes: Conjunctivae are clear without discharge or drainage Head: Atraumatic Neck: Supple Respiratory: No cough. Respirations are even and unlabored. Musculoskeletal: Observed ambulating without assistance. 2+ edema in the left lower leg; and 1+ in right.  Neurologic: Motor and sensory function intact.  Skin: No open wounds over lower extremities.  Psychiatric: Affect and behavior are appropriate.  ____________________________________________   LABS (all labs ordered are listed, but only abnormal results are displayed)  Labs Reviewed  COMPREHENSIVE METABOLIC PANEL - Abnormal; Notable for the following components:      Result Value   Glucose, Bld 113 (*)    All other components within normal limits  URINALYSIS, ROUTINE W REFLEX MICROSCOPIC - Abnormal; Notable for the following components:   Color, Urine YELLOW (*)    APPearance CLOUDY (*)    Protein, ur 30 (*)    Leukocytes,Ua LARGE (*)    Bacteria, UA RARE (*)    All other components within normal limits  URINE DRUG SCREEN, QUALITATIVE ([facility] ONLY) - Abnormal; Notable for the following components:   Opiate, Ur Screen POSITIVE (*)    Benzodiazepine, Ur  Scrn POSITIVE (*)    All other components within normal limits  CBC WITH DIFFERENTIAL/PLATELET  BRAIN NATRIURETIC PEPTIDE   ____________________________________________  RADIOLOGY  Patient left prior to imaging.  I, Sherrie George, personally viewed and evaluated these images (plain radiographs) as part of my medical decision making, as well as reviewing the written report by the radiologist.  No results found. ____________________________________________   PROCEDURES  Procedures  ____________________________________________   INITIAL IMPRESSION / ASSESSMENT AND PLAN / ED COURSE  Teresa Jefferson is a 55 y.o. who presents to the emergency department for treatment of bilateral lower extremity pain. No specific injury. MSE in triage  labs and [place] initiated from there. Patient pleading for pain medication. With her history I am reluctant to give narcotic medications. Flexeril ordered as I feel this is likely musculoskeletal pain instead of DVT. Patient states "I'm not here for your pain medicine, I'll can get mine on the street." She was encouraged to take the  flexeril and await [place] and labs.  Staff report that the patient left prior to [place].   Medications  cyclobenzaprine (FLEXERIL) tablet 10 mg (10 mg Oral Given 02/25/21 0935)    Pertinent labs & imaging results that were available during my care of the patient were reviewed by me and considered in my medical decision making (see chart for details).   _________________________________________   FINAL CLINICAL IMPRESSION(S) / ED DIAGNOSES  Final diagnoses:  Pain in both lower extremities    ED Discharge Orders     None        If controlled substance prescribed during this visit, 12 month history viewed on the [place] prior to issuing an initial prescription for Schedule II or III opiod.    Victorino Dike, FNP 02/25/21 1131    Blake Divine, MD 02/25/21 1456

## 2021-02-25 NOTE — ED Notes (Signed)
Pt walking in hallway  states she can't wait  she was leaving

## 2021-03-21 ENCOUNTER — Emergency Department
Admission: EM | Admit: 2021-03-21 | Discharge: 2021-03-22 | Discharge status: Against Medical Advice | Payer: Medicaid Other

## 2021-03-21 NOTE — ED Notes (Signed)
Pt called for triage x2 

## 2021-03-22 NOTE — ED Notes (Signed)
Pt not answer in lobby when called for triage. Pt called but no answer by phone. Pt called x3 with no answer.

## 2021-03-31 ENCOUNTER — Other Ambulatory Visit: Payer: Self-pay

## 2021-03-31 ENCOUNTER — Emergency Department
Admission: EM | Admit: 2021-03-31 | Discharge: 2021-03-31 | Disposition: A | Discharge status: Home / Self Care | Payer: Medicaid Other | Attending: Emergency Medicine | Admitting: Emergency Medicine

## 2021-03-31 DIAGNOSIS — R109 Unspecified abdominal pain: Secondary | ICD-10-CM | POA: Diagnosis present

## 2021-03-31 DIAGNOSIS — Z5321 Procedure and treatment not carried out due to patient leaving prior to being seen by health care provider: Secondary | ICD-10-CM | POA: Diagnosis not present

## 2021-03-31 LAB — CBC
HCT: 40.8 % (ref 36.0–46.0)
Hemoglobin: 12.9 g/dL (ref 12.0–15.0)
MCH: 28 pg (ref 26.0–34.0)
MCHC: 31.6 g/dL (ref 30.0–36.0)
MCV: 88.7 fL (ref 80.0–100.0)
Platelets: 206 10*3/uL (ref 150–400)
RBC: 4.6 MIL/uL (ref 3.87–5.11)
RDW: 12.9 % (ref 11.5–15.5)
WBC: 5.3 10*3/uL (ref 4.0–10.5)
nRBC: 0 % (ref 0.0–0.2)

## 2021-03-31 LAB — COMPREHENSIVE METABOLIC PANEL
ALT: 13 U/L (ref 0–44)
AST: 18 U/L (ref 15–41)
Albumin: 4.1 g/dL (ref 3.5–5.0)
Alkaline Phosphatase: 59 U/L (ref 38–126)
Anion gap: 7 (ref 5–15)
BUN: 16 mg/dL (ref 6–20)
CO2: 25 mmol/L (ref 22–32)
Calcium: 9 mg/dL (ref 8.9–10.3)
Chloride: 109 mmol/L (ref 98–111)
Creatinine, Ser: 0.72 mg/dL (ref 0.44–1.00)
GFR, Estimated: >60 mL/min (ref >60)
Glucose, Bld: 99 mg/dL (ref 70–99)
Potassium: 3.8 mmol/L (ref 3.5–5.1)
Sodium: 141 mmol/L (ref 135–145)
Total Bilirubin: 0.7 mg/dL (ref 0.3–1.2)
Total Protein: 7.3 g/dL (ref 6.5–8.1)

## 2021-03-31 LAB — LIPASE, BLOOD: Lipase: 31 U/L (ref 11–51)

## 2021-03-31 NOTE — ED Triage Notes (Signed)
Pt come with c/o withdrawals from opioids. Pt states she had been taking 6 30s perocets a day for last 3 months. Pt states pain is worse and now she is going thru withdrawals.  Pt denies any SI ior HI.

## 2021-05-13 ENCOUNTER — Emergency Department
Admission: EM | Admit: 2021-05-13 | Discharge: 2021-05-13 | Disposition: A | Discharge status: Home / Self Care | Payer: Medicaid Other | Attending: Emergency Medicine | Admitting: Emergency Medicine

## 2021-05-13 ENCOUNTER — Other Ambulatory Visit: Payer: Self-pay

## 2021-05-13 DIAGNOSIS — Z79899 Other long term (current) drug therapy: Secondary | ICD-10-CM | POA: Diagnosis not present

## 2021-05-13 DIAGNOSIS — F1199 Opioid use, unspecified with unspecified opioid-induced disorder: Secondary | ICD-10-CM | POA: Insufficient documentation

## 2021-05-13 DIAGNOSIS — J45909 Unspecified asthma, uncomplicated: Secondary | ICD-10-CM | POA: Diagnosis not present

## 2021-05-13 DIAGNOSIS — R000 Tachycardia, unspecified: Secondary | ICD-10-CM | POA: Insufficient documentation

## 2021-05-13 DIAGNOSIS — G8929 Other chronic pain: Secondary | ICD-10-CM | POA: Insufficient documentation

## 2021-05-13 DIAGNOSIS — M79605 Pain in left leg: Secondary | ICD-10-CM | POA: Diagnosis not present

## 2021-05-13 DIAGNOSIS — Z20822 Contact with and (suspected) exposure to covid-19: Secondary | ICD-10-CM | POA: Diagnosis not present

## 2021-05-13 DIAGNOSIS — E876 Hypokalemia: Secondary | ICD-10-CM | POA: Insufficient documentation

## 2021-05-13 DIAGNOSIS — I10 Essential (primary) hypertension: Secondary | ICD-10-CM | POA: Insufficient documentation

## 2021-05-13 DIAGNOSIS — F1190 Opioid use, unspecified, uncomplicated: Secondary | ICD-10-CM

## 2021-05-13 DIAGNOSIS — Y900 Blood alcohol level of less than 20 mg/100 ml: Secondary | ICD-10-CM | POA: Diagnosis not present

## 2021-05-13 DIAGNOSIS — M79604 Pain in right leg: Secondary | ICD-10-CM | POA: Diagnosis not present

## 2021-05-13 LAB — CBC
HCT: 41.8 % (ref 36.0–46.0)
Hemoglobin: 13.4 g/dL (ref 12.0–15.0)
MCH: 28 pg (ref 26.0–34.0)
MCHC: 32.1 g/dL (ref 30.0–36.0)
MCV: 87.4 fL (ref 80.0–100.0)
Platelets: 238 10*3/uL (ref 150–400)
RBC: 4.78 MIL/uL (ref 3.87–5.11)
RDW: 13.3 % (ref 11.5–15.5)
WBC: 5.8 10*3/uL (ref 4.0–10.5)
nRBC: 0 % (ref 0.0–0.2)

## 2021-05-13 LAB — URINALYSIS, ROUTINE W REFLEX MICROSCOPIC
Bilirubin Urine: NEGATIVE
Glucose, UA: NEGATIVE mg/dL
Hgb urine dipstick: NEGATIVE
Ketones, ur: 5 mg/dL — AB
Leukocytes,Ua: NEGATIVE
Nitrite: NEGATIVE
Protein, ur: NEGATIVE mg/dL
Specific Gravity, Urine: 1.013 (ref 1.005–1.030)
pH: 6 (ref 5.0–8.0)

## 2021-05-13 LAB — BASIC METABOLIC PANEL
Anion gap: 7 (ref 5–15)
BUN: 14 mg/dL (ref 6–20)
CO2: 25 mmol/L (ref 22–32)
Calcium: 9.3 mg/dL (ref 8.9–10.3)
Chloride: 107 mmol/L (ref 98–111)
Creatinine, Ser: 0.8 mg/dL (ref 0.44–1.00)
GFR, Estimated: >60 mL/min (ref >60)
Glucose, Bld: 123 mg/dL — ABNORMAL HIGH (ref 70–99)
Potassium: 2.9 mmol/L — ABNORMAL LOW (ref 3.5–5.1)
Sodium: 139 mmol/L (ref 135–145)

## 2021-05-13 LAB — URINE DRUG SCREEN, QUALITATIVE (ARMC ONLY)
Amphetamines, Ur Screen: NOT DETECTED
Barbiturates, Ur Screen: NOT DETECTED
Benzodiazepine, Ur Scrn: NOT DETECTED
Cannabinoid 50 Ng, Ur ~~LOC~~: NOT DETECTED
Cocaine Metabolite,Ur ~~LOC~~: POSITIVE — AB
MDMA (Ecstasy)Ur Screen: NOT DETECTED
Methadone Scn, Ur: NOT DETECTED
Opiate, Ur Screen: POSITIVE — AB
Phencyclidine (PCP) Ur S: NOT DETECTED
Tricyclic, Ur Screen: NOT DETECTED

## 2021-05-13 LAB — RESP PANEL BY RT-PCR (FLU A&B, COVID) ARPGX2
Influenza A by PCR: NEGATIVE
Influenza B by PCR: NEGATIVE
SARS Coronavirus 2 by RT PCR: NEGATIVE

## 2021-05-13 LAB — ETHANOL: Alcohol, Ethyl (B): <10 mg/dL (ref <10)

## 2021-05-13 LAB — SALICYLATE LEVEL: Salicylate Lvl: <7 mg/dL — ABNORMAL LOW (ref 7.0–30.0)

## 2021-05-13 LAB — ACETAMINOPHEN LEVEL: Acetaminophen (Tylenol), Serum: <10 ug/mL — ABNORMAL LOW (ref 10–30)

## 2021-05-13 LAB — PREGNANCY, URINE: Preg Test, Ur: NEGATIVE

## 2021-05-13 LAB — CK: Total CK: 93 U/L (ref 38–234)

## 2021-05-13 MED ORDER — POTASSIUM CHLORIDE ER 10 MEQ PO TBCR: 20.0000 meq | EXTENDED_RELEASE_TABLET | Freq: Two times a day (BID) | ORAL | 0 refills | Status: DC

## 2021-05-13 MED ORDER — KETOROLAC TROMETHAMINE 30 MG/ML IJ SOLN
15.0000 mg | Freq: Once | INTRAMUSCULAR | Status: AC
  Administered 2021-05-13: 15:00:00 15 mg via INTRAVENOUS

## 2021-05-13 MED ORDER — POTASSIUM CHLORIDE CRYS ER 20 MEQ PO TBCR
40.0000 meq | EXTENDED_RELEASE_TABLET | Freq: Two times a day (BID) | ORAL | Status: DC
  Administered 2021-05-13: 15:00:00 40 meq via ORAL
  Filled 2021-05-13: qty 2

## 2021-05-13 MED ORDER — LORAZEPAM 1 MG PO TABS
1.0000 mg | ORAL_TABLET | Freq: Once | ORAL | Status: AC
  Administered 2021-05-13: 11:00:00 1 mg via ORAL
  Filled 2021-05-13: qty 1

## 2021-05-13 MED ORDER — BUPRENORPHINE HCL-NALOXONE HCL 8-2 MG SL SUBL
1.0000 | SUBLINGUAL_TABLET | Freq: Every day | SUBLINGUAL | Status: DC
  Administered 2021-05-13: 11:00:00 1 via SUBLINGUAL
  Filled 2021-05-13: qty 1

## 2021-05-13 MED ORDER — SODIUM CHLORIDE 0.9 % IV BOLUS
1000.0000 mL | Freq: Once | INTRAVENOUS | Status: AC
  Administered 2021-05-13: 15:00:00 1000 mL via INTRAVENOUS

## 2021-05-13 MED ORDER — DIPHENHYDRAMINE HCL 50 MG/ML IJ SOLN
25.0000 mg | Freq: Once | INTRAMUSCULAR | Status: AC
  Administered 2021-05-13: 11:00:00 25 mg via INTRAVENOUS
  Filled 2021-05-13: qty 1

## 2021-05-13 NOTE — ED Provider Notes (Signed)
[facility]  ____________________________________________   Event Date/Time   First MD Initiated Contact with Patient 05/13/21 1014     (approximate)  I have reviewed the triage vital signs and the nursing notes.   HISTORY  Chief Complaint Drug Overdose and SI    HPI Teresa Jefferson is a 55 y.o. female with past medical history of polysubstance use disorder, schizophrenia and hypertension who presents with leg pain and restlessness.  Patient has been taking Roxicodone for chronic leg pain which she got off the street.  Prior to that she was on Suboxone.  She took the last dose of 30 mg of the oxycontin around 7 AM today.  She did not have any more left so she took a Suboxone around 10.  She feels like she has precipitated withdrawal.  She endorses feeling like she cannot control her extremities and feeling like her leg pain is worse.  She endorses anxiety denies chest pain or shortness of breath.  She did tell triage that she was suicidal however she denies suicidal ideation to me.  She said she said that because she is having leg pain but adamantly denies feeling that she is going to harm herself.  Denies taking anything in attempt to harm herself.         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/17/2017   Cocaine use disorder, severe, dependence ([place]) 11/17/2017   Opioid use disorder, severe, dependence  ([place]) 11/17/2017   Severe episode of recurrent major depressive disorder, without psychotic features ([place]) 11/16/2017   Opiate abuse, continuous ([place]) 11/16/2017   Cocaine abuse ([place]) 11/16/2017   Suicidal ideation 11/16/2017   Chronic pain 11/16/2017    Past Surgical History:  Procedure Laterality Date   ABDOMINAL HYSTERECTOMY     COLONOSCOPY N/A 11/23/2017   Procedure: COLONOSCOPY;  Surgeon: Lin Landsman, MD;  Location: [facility] ENDOSCOPY;  Service: Gastroenterology;  Laterality: N/A;   COLONOSCOPY N/A 11/24/2017   Procedure: COLONOSCOPY;  Surgeon: Lin Landsman, MD;  Location: [facility] ENDOSCOPY;  Service: Gastroenterology;  Laterality: N/A;   KNEE SURGERY Right    TONSILLECTOMY      Prior to Admission medications   Medication Sig Start Date End Date Taking? Authorizing Provider  potassium chloride (KLOR-CON) 10 MEQ tablet Take 2 tablets (20 mEq total) by mouth 2 (two) times daily for 5 days. 05/13/21 05/18/21 Yes Rada Hay, MD  hydrocerin (EUCERIN) CREA Apply 1 application topically 3 (three) times daily. Patient not taking: Reported on 01/11/2021 10/17/19   Fritzi Mandes, MD  HYDROcodone-acetaminophen (NORCO/VICODIN) 5-325 MG tablet Take 1 tablet by mouth 2 (two) times daily as needed. 12/14/20   [provider]  ibuprofen (ADVIL) 800 MG tablet Take 1 tablet (800 mg total) by mouth every 8 (eight) hours as needed for moderate pain. 11/26/20   Triplett, Cari B, FNP  Lidocaine (HM LIDOCAINE PATCH) 4 % PTCH Apply 1  patch topically every 12 (twelve) hours. Patient not taking: Reported on 01/11/2021 12/31/20   Marlana Salvage, PA  lisinopril (ZESTRIL) 10 MG tablet Take 1 tablet (10 mg total) by mouth daily. 12/21/18   [name], [name], MD  ondansetron (ZOFRAN) 4 MG tablet Take 1 tablet (4 mg total) by mouth every 8 (eight) hours as needed for up to 10 doses for nausea or vomiting. 08/10/20   Lucrezia Starch, MD  QUEtiapine (SEROQUEL) 400 MG tablet Take 1 tablet (400 mg total)  by mouth at bedtime. Patient not taking: Reported on 01/11/2021 12/21/18   [name], [name], MD  gabapentin (NEURONTIN) 300 MG capsule Take 2 capsules (600 mg total) by mouth 3 (three) times daily. 12/21/18 08/10/20  [name], [name], MD    Allergies Amoxicillin, Effexor [venlafaxine], Ketorolac, Lamotrigine, and Sulfa antibiotics  No family history on file.  Social History Social History   Tobacco Use   Smoking status: Never   Smokeless tobacco: Never  Vaping Use   Vaping Use: Never used  Substance Use Topics   Alcohol use: Yes   Drug use: Not Currently    Comment: crack and opiates    Review of Systems   Review of Systems  Constitutional:  Negative for chills and fever.  Respiratory:  Negative for shortness of breath.   Cardiovascular:  Negative for chest pain.  Musculoskeletal:  Positive for arthralgias and myalgias.  Psychiatric/Behavioral:  Negative for self-injury and suicidal ideas. The patient is nervous/anxious and is hyperactive.   All other systems reviewed and are negative.  Physical Exam Updated Vital Signs BP (!) 194/102 (BP Location: Right Arm)    Pulse 76    Temp 98.2 F (36.8 C) (Oral)    Resp 18    Ht 5\' 2"  (1.575 m)    Wt 72.6 kg    SpO2 97%    BMI 29.26 kg/m   Physical Exam Vitals and nursing note reviewed.  Constitutional:      General: She is not in acute distress.    Appearance: Normal appearance.  HENT:     Head: Normocephalic and atraumatic.  Eyes:     General: No scleral icterus.    Conjunctiva/sclera: Conjunctivae normal.  Pulmonary:     Effort: Pulmonary effort is normal. No respiratory distress.     Breath sounds: No stridor.  Musculoskeletal:        General: No deformity or signs of injury.     Cervical back: Normal range of motion.  Skin:    General: Skin is dry.     Coloration: Skin is not jaundiced or pale.  Neurological:     General: No focal deficit present.     Mental Status: She is alert and oriented to person, place, and time.  Mental status is at baseline.  Psychiatric:        Mood and Affect: Mood normal.        Behavior: Behavior normal.     Comments: She is anxious, hyperactive, does not sit still in bed     LABS (all labs ordered are listed, but only abnormal results are displayed)  Labs Reviewed  BASIC METABOLIC PANEL - Abnormal; Notable for the following components:      Result Value   Potassium 2.9 (*)    Glucose, Bld 123 (*)    All other components within normal limits  URINALYSIS, ROUTINE W REFLEX MICROSCOPIC - Abnormal; Notable for the following components:   Color, Urine YELLOW (*)  APPearance CLEAR (*)    Ketones, ur 5 (*)    All other components within normal limits  SALICYLATE LEVEL - Abnormal; Notable for the following components:   Salicylate Lvl <1.6 (*)    All other components within normal limits  ACETAMINOPHEN LEVEL - Abnormal; Notable for the following components:   Acetaminophen (Tylenol), Serum <10 (*)    All other components within normal limits  URINE DRUG SCREEN, QUALITATIVE ([facility] ONLY) - Abnormal; Notable for the following components:   Cocaine Metabolite,Ur [place] POSITIVE (*)    Opiate, Ur Screen POSITIVE (*)    All other components within normal limits  RESP PANEL BY RT-PCR (FLU A&B, COVID) ARPGX2  CBC  ETHANOL  PREGNANCY, URINE  CK  CBG MONITORING, ED   ____________________________________________  EKG   ____________________________________________  RADIOLOGY Almeta Monas, personally viewed and evaluated these images (plain radiographs) as part of my medical decision making, as well as reviewing the written report by the radiologist.  ED MD interpretation:      ____________________________________________   PROCEDURES  Procedure(s) performed (including Critical Care):  Procedures   ____________________________________________   INITIAL IMPRESSION / ASSESSMENT AND PLAN / ED COURSE     Patient is a 55 year old female with polysubstance  use disorder who takes chronic opiates who presents with lower extremity pain and restlessness in the setting of taking Suboxone.  Patient had been on Suboxone 8 and 2 twice daily but has been off this and taking oxycodone off the street.  She took the last dose of oxycodone today and then Suboxone shortly after.  She is hypertensive and tachycardic.  Appears restless and anxious but does not have any other symptoms opiate withdrawal.  I suspect that she may have precipitated withdrawal.  We will give another dose of Suboxone as well as some Benadryl and small dose of Ativan.  She is adamant that she is not suicidal and she is not psychotic at this time.  No indication for IVC.  Will reassess after meds.  Patient really no patient did not have much improvement in symptoms after the Suboxone and Ativan and Benadryl.  She is resting comfortably but complains of ongoing restlessness and cramping in her bilateral lower extremities.  CK is normal.  She is mildly hypokalemic which may be contributing.  Potassium was supplemented.  She also given a liter of fluids.  Ultimately I recommended that she continue on the Suboxone as prescribed by her provider.  We will send her home with potassium supplementation.  She is stable for discharge.  Clinical Course as of 05/13/21 1518  Thu May 13, 2021  1510 CK Total: 93 [KM]    Clinical Course User Index [KM] Rada Hay, MD     ____________________________________________   FINAL CLINICAL IMPRESSION(S) / ED DIAGNOSES  Final diagnoses:  Opioid use disorder  Hypokalemia     ED Discharge Orders          Ordered    potassium chloride (KLOR-CON) 10 MEQ tablet  2 times daily        05/13/21 1518             Note:  This document was prepared using Dragon voice recognition software and may include unintentional dictation errors.    Rada Hay, MD 05/13/21 [phone]

## 2021-05-13 NOTE — ED Notes (Signed)
E-signature not working at this time. Pt verbalized understanding of D/C instructions, prescriptions and follow up care with no further questions at this time. Pt in NAD and ambulatory at time of D/C. Pt wheeled to lobby per patient request. Belongings bag 1/1 returned to patient at time of departure. MD Starleen Blue spoke with patient regarding questions before departure.

## 2021-05-13 NOTE — ED Notes (Signed)
Pt repeating multiple times "I just wish I was dead. I don't want to feel like this. The way I'm feeling right now, I want to kill myself."

## 2021-05-13 NOTE — ED Notes (Signed)
Patient Belongings: -Pink Minnie mouse pants -[employer] blue [employer] sweatshirt -Pink hairbow -Black and white socks -Black shoes -Jean shorts -White/gray leg warmers -Red underwear -Navy blue tshirt -Pink bra

## 2021-05-13 NOTE — Discharge Instructions (Addendum)
Please continue to take your Suboxone as prescribed.  Your potassium was low.  Please take the supplement twice daily for the next 5 days.  Please follow-up with your primary care provider to have repeat calcium level drawn in about 1 week.

## 2021-05-13 NOTE — ED Triage Notes (Addendum)
Pt comes via EMS with c/o taking unknown amount of 7.5mg  viking, bought roxy 30 off street last night and took 2 of them two hours ago and then had some suboxlone which was a quarter of tablet 2 hours ago. Now pt stating pain all over, anxiety restlessness.  Pt states dizziness and diarrhea since last hour.   BP-220/116 67-HR RR-19 99% RA ETCO@-29 CBG-183 T-98.5  20 g in LAC  Pt states SI thoughts that started last night.

## 2021-05-13 NOTE — ED Notes (Signed)
EDP updated on pt request to see her again as well as most recent set of vital signs

## 2021-09-25 ENCOUNTER — Encounter: Payer: Self-pay | Admitting: Emergency Medicine

## 2021-09-25 ENCOUNTER — Emergency Department: Payer: Medicaid Other

## 2021-09-25 ENCOUNTER — Other Ambulatory Visit: Payer: Self-pay

## 2021-09-25 ENCOUNTER — Emergency Department
Admission: EM | Admit: 2021-09-25 | Discharge: 2021-09-25 | Disposition: A | Discharge status: Home / Self Care | Payer: Medicaid Other | Attending: Emergency Medicine | Admitting: Emergency Medicine

## 2021-09-25 DIAGNOSIS — J181 Lobar pneumonia, unspecified organism: Secondary | ICD-10-CM | POA: Diagnosis not present

## 2021-09-25 DIAGNOSIS — J45909 Unspecified asthma, uncomplicated: Secondary | ICD-10-CM | POA: Diagnosis not present

## 2021-09-25 DIAGNOSIS — E039 Hypothyroidism, unspecified: Secondary | ICD-10-CM | POA: Diagnosis not present

## 2021-09-25 DIAGNOSIS — J189 Pneumonia, unspecified organism: Secondary | ICD-10-CM

## 2021-09-25 DIAGNOSIS — I10 Essential (primary) hypertension: Secondary | ICD-10-CM | POA: Insufficient documentation

## 2021-09-25 DIAGNOSIS — R0602 Shortness of breath: Secondary | ICD-10-CM | POA: Diagnosis present

## 2021-09-25 LAB — BASIC METABOLIC PANEL
Anion gap: 7 (ref 5–15)
BUN: 29 mg/dL — ABNORMAL HIGH (ref 6–20)
CO2: 23 mmol/L (ref 22–32)
Calcium: 9.4 mg/dL (ref 8.9–10.3)
Chloride: 108 mmol/L (ref 98–111)
Creatinine, Ser: 1.21 mg/dL — ABNORMAL HIGH (ref 0.44–1.00)
GFR, Estimated: 53 mL/min — ABNORMAL LOW (ref >60)
Glucose, Bld: 139 mg/dL — ABNORMAL HIGH (ref 70–99)
Potassium: 4.2 mmol/L (ref 3.5–5.1)
Sodium: 138 mmol/L (ref 135–145)

## 2021-09-25 LAB — CBC
HCT: 44.6 % (ref 36.0–46.0)
Hemoglobin: 14 g/dL (ref 12.0–15.0)
MCH: 28.3 pg (ref 26.0–34.0)
MCHC: 31.4 g/dL (ref 30.0–36.0)
MCV: 90.3 fL (ref 80.0–100.0)
Platelets: 279 10*3/uL (ref 150–400)
RBC: 4.94 MIL/uL (ref 3.87–5.11)
RDW: 13.4 % (ref 11.5–15.5)
WBC: 5.5 10*3/uL (ref 4.0–10.5)
nRBC: 0 % (ref 0.0–0.2)

## 2021-09-25 LAB — T4, FREE: Free T4: 0.67 ng/dL (ref 0.61–1.12)

## 2021-09-25 LAB — TROPONIN I (HIGH SENSITIVITY): Troponin I (High Sensitivity): 4 ng/L (ref <18)

## 2021-09-25 LAB — MAGNESIUM: Magnesium: 2 mg/dL (ref 1.7–2.4)

## 2021-09-25 LAB — TSH: TSH: 3.403 u[IU]/mL (ref 0.350–4.500)

## 2021-09-25 MED ORDER — AZITHROMYCIN 500 MG PO TABS
500.0000 mg | ORAL_TABLET | Freq: Once | ORAL | Status: AC
  Administered 2021-09-25: 500 mg via ORAL
  Filled 2021-09-25: qty 1

## 2021-09-25 MED ORDER — AZITHROMYCIN 250 MG PO TABS: 250.0000 mg | ORAL_TABLET | Freq: Every day | ORAL | 0 refills | Status: AC

## 2021-09-25 MED ORDER — IBUPROFEN 800 MG PO TABS
800.0000 mg | ORAL_TABLET | Freq: Once | ORAL | Status: AC
  Administered 2021-09-25: 800 mg via ORAL
  Filled 2021-09-25: qty 1

## 2021-09-25 NOTE — ED Provider Notes (Signed)
? ? ?[facility] ?Emergency Department Provider Note ? ? ? ? Event Date/Time  ? First MD Initiated Contact with Patient 09/25/21 1404   ?  (approximate) ? ? ?History  ? ?Chest Pain, Irregular Heart Beat, and Shortness of Breath ? ? ?HPI ? ?Teresa Jefferson is a 56 y.o. female history of hypothyroidism, hypertension, asthma, bipolar disorder, and opiate use disorder, presents to the ED with a few days of intermittent episodes of irregular heartbeat.  Patient admits to snorting street drugs she picked up 2 weeks ago.  She reports that she has been clean for the last 11 days. She reports some intermittent achy deep chest pain.  She has noted some associated shortness of breath, but denies any nausea, vomiting, cough or congestion.  Patient would admit that she has not been on her thyroid medicine for some time.  She was evaluated by her primary provider last week for the same complaints, but is awaiting blood test results. ?  ? ? ?Physical Exam  ? ?Triage Vital Signs: ?ED Triage Vitals  ?Enc Vitals Group  ?   BP 09/25/21 0930 107/78  ?   Pulse Rate 09/25/21 0930 92  ?   Resp 09/25/21 0930 20  ?   Temp 09/25/21 0930 97.7 ?F (36.5 ?C)  ?   Temp src --   ?   SpO2 09/25/21 0930 96 %  ?   Weight 09/25/21 0928 160 lb (72.6 kg)  ?   Height 09/25/21 0928 '5\' 2"'$  (1.575 m)  ?   Head Circumference --   ?   Peak Flow --   ?   Pain Score 09/25/21 0928 5  ?   Pain Loc --   ?   Pain Edu? --   ?   Excl. in [place]? --   ? ? ?Most recent vital signs: ?Vitals:  ? 09/25/21 1420 09/25/21 1553  ?BP: 110/78   ?Pulse: 88   ?Resp: 18   ?Temp:  98 ?F (36.7 ?C)  ?SpO2: 96%   ? ? ?General Awake, no distress. NAD ?HEENT NCAT. PERRL. EOMI. No rhinorrhea. Mucous membranes are moist.  ?CV:  Good peripheral perfusion. RRR ?RESP:  Normal effort. CTA ?ABD:  No distention.  ? ? ?ED Results / Procedures / Treatments  ? ?Labs ?(all labs ordered are listed, but only abnormal results are displayed) ?Labs Reviewed  ?BASIC METABOLIC PANEL -  Abnormal; Notable for the following components:  ?    Result Value  ? Glucose, Bld 139 (*)   ? BUN 29 (*)   ? Creatinine, Ser 1.21 (*)   ? GFR, Estimated 53 (*)   ? All other components within normal limits  ?CBC  ?TSH  ?T4, FREE  ?MAGNESIUM  ?POC URINE PREG, ED  ?TROPONIN I (HIGH SENSITIVITY)  ? ? ? ?EKG ? ?Vent. rate 98 BPM ?PR interval 142 ms ?QRS duration 76 ms ?QT/QTcB 340/434 ms ?P-R-T axes 51 31 49 ?NSR ?Normal axis ?No STEMI ? ?RADIOLOGY ? ?I personally viewed and evaluated these images as part of my medical decision making, as well as reviewing the written report by the radiologist. ? ?ED Provider Interpretation: perihilar opacity noted on PA} ? ?DG Chest 2 View ? ?Result Date: 09/25/2021 ?CLINICAL DATA:  Chest pain and shortness of breath. EXAM: CHEST - 2 VIEW COMPARISON:  07/12/2019 FINDINGS: The lungs are clear without focal pneumonia, edema, pneumothorax or pleural effusion. Small nodule identified parahilar right mid lung.The cardiopericardial silhouette is within normal limits  for size. The visualized bony structures of the thorax are unremarkable. IMPRESSION: Small nodule parahilar right mid lung. CT chest without contrast recommended to further evaluate. Electronically Signed   By: Misty Stanley M.D.   On: 09/25/2021 11:11  ? ?CT CHEST WO CONTRAST ? ?Result Date: 09/25/2021 ?CLINICAL DATA:  Possible right lung nodule on current chest radiograph. Short of breath. EXAM: CT CHEST WITHOUT CONTRAST TECHNIQUE: Multidetector CT imaging of the chest was performed following the standard protocol without IV contrast. RADIATION DOSE REDUCTION: This exam was performed according to the departmental dose-optimization program which includes automated exposure control, adjustment of the mA and/or kV according to patient size and/or use of iterative reconstruction technique. COMPARISON:  Current chest radiograph.  CT, 06/10/2015. FINDINGS: Cardiovascular: Heart is normal in size and configuration. Mild three-vessel  coronary artery calcifications. No pericardial effusion. Great vessels are normal in caliber. Mediastinum/Nodes: No neck base, mediastinal or hilar masses or enlarged lymph nodes. Trachea and esophagus are unremarkable. Lungs/Pleura: No lung mass or nodule. No lung opacity to correspond to the questionable nodule noted on the current chest radiograph, which was likely due to superimposed normal structures. Subtle ground-glass type opacity noted in the left upper lobe. Chronic scarring and associated bronchiectasis in the dependent left upper lobe lingula. Air cyst in the left lower lobe, stable from the prior CT. No pulmonary edema.  No pleural effusion or pneumothorax. Upper Abdomen: No acute abnormality. Musculoskeletal: No fracture or acute finding. No bone lesion. No chest wall mass. IMPRESSION: 1. No lung mass or nodule. The nodular opacity noted on the current chest radiograph may have been due to a cardiac lead. 2. Subtle ground-glass opacity in the left upper lobe. This may be chronic, but could reflect acute infection. No other evidence acute cardiopulmonary disease. 3. Coronary artery calcifications. Electronically Signed   By: Lajean Manes M.D.   On: 09/25/2021 14:53   ? ? ?PROCEDURES: ? ?Critical Care performed: No ? ?Procedures ? ? ?MEDICATIONS ORDERED IN ED: ?Medications  ?azithromycin (ZITHROMAX) tablet 500 mg (500 mg Oral Given 09/25/21 1552)  ?ibuprofen (ADVIL) tablet 800 mg (800 mg Oral Given 09/25/21 1552)  ? ? ? ?IMPRESSION / MDM / ASSESSMENT AND PLAN / ED COURSE  ?I reviewed the triage vital signs and the nursing notes. ?             ?               ? ?Differential diagnosis includes, but is not limited to, ACS, aortic dissection, pulmonary embolism, cardiac tamponade, pneumothorax, pneumonia, pericarditis, myocarditis, GI-related causes including esophagitis/gastritis, and musculoskeletal chest wall pain.   ? ?Patient to the ED for evaluation of intermittent episodes of irregular heart beat as  well as achy chest pain and some associated shortness of breath and DOE.  She is evaluated for her complaints today, found have reassuring exam overall.  No signs of any acute respiratory distress, dehydration, toxic appearance.  Labs are reassuring as it shows no acute leukocytosis, critical anemia, or electrolyte abnormality.  Patient is further evaluated with a chest x-ray which shows a concerning anterior chest lesion.  Further evaluation with CT reveals no acute findings.  There is however, indication of a probable left upper lobe early consolidation versus atelectasis.  Given the symptoms reported by the patient, we will treat empirically with a azithromycin.  Patient's diagnosis is consistent with CAP. Patient will be discharged home with prescriptions for azithromycin. Patient is to follow up with primary provider as needed  or otherwise directed. Patient is given ED precautions to return to the ED for any worsening or new symptoms. ? ? ?FINAL CLINICAL IMPRESSION(S) / ED DIAGNOSES  ? ?Final diagnoses:  ?Community acquired pneumonia of left upper lobe of lung  ? ? ? ?Rx / DC Orders  ? ?ED Discharge Orders   ? ?      Ordered  ?  azithromycin (ZITHROMAX Z-PAK) 250 MG tablet  Daily       ? 09/25/21 1506  ? ?  ?  ? ?  ? ? ? ?Note:  This document was prepared using Dragon voice recognition software and may include unintentional dictation errors. ? ?  ?Melvenia Needles, PA-C ?09/25/21 1947 ? ?  ?Rada Hay, MD ?09/26/21 1058 ? ?

## 2021-09-25 NOTE — ED Triage Notes (Signed)
Pt reports for the last few days she has had intermittent episodes of an irregular HR, some deep chest pain achy in nature and SOB. Pt reports she has felt weak and intermittently dizzy as well. Pt reports not sure if it is her thyroid or her heart. Pt reports thyroid issues and reports she is supposed to be on medication but she has not taken it in a long time because of how it may her feel. Pt reports saw her MD last week and they drew blood to check her thyroid but states it will not be back until next week so they told her if her sx's got worse to come to the ED ?

## 2021-09-25 NOTE — Discharge Instructions (Signed)
Take the antibiotic as directed. Follow-up with your provider as needed.  ?

## 2021-10-01 ENCOUNTER — Other Ambulatory Visit: Payer: Self-pay | Admitting: Family Medicine

## 2021-10-01 DIAGNOSIS — N644 Mastodynia: Secondary | ICD-10-CM

## 2021-10-01 DIAGNOSIS — Z1231 Encounter for screening mammogram for malignant neoplasm of breast: Secondary | ICD-10-CM

## 2021-10-05 ENCOUNTER — Encounter (INDEPENDENT_AMBULATORY_CARE_PROVIDER_SITE_OTHER): Payer: Self-pay | Admitting: Nurse Practitioner

## 2022-02-01 ENCOUNTER — Other Ambulatory Visit: Payer: Self-pay | Admitting: Physician Assistant

## 2022-02-01 DIAGNOSIS — M7122 Synovial cyst of popliteal space [Baker], left knee: Secondary | ICD-10-CM

## 2022-03-14 ENCOUNTER — Encounter (INDEPENDENT_AMBULATORY_CARE_PROVIDER_SITE_OTHER): Payer: Self-pay

## 2022-04-14 ENCOUNTER — Encounter (INDEPENDENT_AMBULATORY_CARE_PROVIDER_SITE_OTHER): Payer: Self-pay | Admitting: Nurse Practitioner

## 2022-05-03 ENCOUNTER — Ambulatory Visit (INDEPENDENT_AMBULATORY_CARE_PROVIDER_SITE_OTHER): Payer: Medicaid Other | Admitting: Nurse Practitioner

## 2022-05-03 ENCOUNTER — Encounter (INDEPENDENT_AMBULATORY_CARE_PROVIDER_SITE_OTHER): Payer: Self-pay | Admitting: Nurse Practitioner

## 2022-05-03 VITALS — BP 139/81 | HR 80 | Resp 16 | Ht 62.0 in | Wt 129.0 lb

## 2022-05-03 DIAGNOSIS — I10 Essential (primary) hypertension: Secondary | ICD-10-CM | POA: Diagnosis not present

## 2022-05-03 DIAGNOSIS — M7989 Other specified soft tissue disorders: Secondary | ICD-10-CM | POA: Diagnosis not present

## 2022-05-13 ENCOUNTER — Other Ambulatory Visit (INDEPENDENT_AMBULATORY_CARE_PROVIDER_SITE_OTHER): Payer: Self-pay | Admitting: Nurse Practitioner

## 2022-05-13 DIAGNOSIS — I83893 Varicose veins of bilateral lower extremities with other complications: Secondary | ICD-10-CM

## 2022-05-16 ENCOUNTER — Encounter (INDEPENDENT_AMBULATORY_CARE_PROVIDER_SITE_OTHER): Payer: Self-pay | Admitting: Nurse Practitioner

## 2022-05-16 NOTE — Progress Notes (Signed)
Subjective:    Patient ID: Teresa Jefferson, female    DOB: 17-Feb-1966, 57 y.o.   MRN: [id number] Chief Complaint  Patient presents with   New Patient (Initial Visit)    Varicose veins    Patient is seen for evaluation of leg swelling. The patient first noticed the swelling remotely but is now concerned because of a significant increase in the overall edema. The swelling isn't associated with significant pain.  There has been an increasing amount of  discoloration noted by the patient. The patient notes that in the morning the legs are improved but they steadily worsened throughout the course of the day. Elevation seems to make the swelling of the legs better, dependency makes them much worse.   There is no history of ulcerations associated with the swelling.   The patient denies any recent changes in their medications.  The patient has not been wearing graduated compression.  The patient has no had any past angiography, interventions or vascular surgery.  The patient denies a history of DVT or PE. There is no prior history of phlebitis. There is no history of primary lymphedema.  There is no history of radiation treatment to the groin or pelvis No history of malignancies. No history of trauma or groin or pelvic surgery. No history of foreign travel or parasitic infections area      Review of Systems  Cardiovascular:  Positive for leg swelling.  All other systems reviewed and are negative.      Objective:   Physical Exam Vitals reviewed.  HENT:     Head: Normocephalic.  Cardiovascular:     Rate and Rhythm: Normal rate.     Pulses: Normal pulses.  Pulmonary:     Effort: Pulmonary effort is normal.  Musculoskeletal:     Right lower leg: Edema present.     Left lower leg: Edema present.  Skin:    General: Skin is warm and dry.  Neurological:     Mental Status: She is alert and oriented to person, place, and time.  Psychiatric:        Mood and Affect: Mood normal.         Behavior: Behavior normal.        Thought Content: Thought content normal.        Judgment: Judgment normal.     BP 139/81 (BP Location: Right Arm)   Pulse 80   Resp 16   Ht '5\' 2"'$  (1.575 m)   Wt 129 lb (58.5 kg)   BMI 23.59 kg/m   Past Medical History:  Diagnosis Date   Asthma    Bipolar 1 disorder (HCC)    Depression    Hypertension    Neuropathy    Schizophrenia ([place])    Thyroid disease     Social History   Socioeconomic History   Marital status: Legally Separated    Spouse name: Not on file   Number of children: Not on file   Years of education: Not on file   Highest education level: Not on file  Occupational History   Not on file  Tobacco Use   Smoking status: Never   Smokeless tobacco: Never  Vaping Use   Vaping Use: Never used  Substance and Sexual Activity   Alcohol use: Yes   Drug use: Not Currently    Comment: crack and opiates   Sexual activity: Not on file  Other Topics Concern   Not on file  Social History Narrative   Not  on file   Social Determinants of Health   Financial Resource Strain: Not on file  Food Insecurity: Not on file  Transportation Needs: Not on file  Physical Activity: Not on file  Stress: Not on file  Social Connections: Not on file  Intimate Partner Violence: Not on file    Past Surgical History:  Procedure Laterality Date   ABDOMINAL HYSTERECTOMY     COLONOSCOPY N/A 11/23/2017   Procedure: COLONOSCOPY;  Surgeon: Lin Landsman, MD;  Location: [facility] ENDOSCOPY;  Service: Gastroenterology;  Laterality: N/A;   COLONOSCOPY N/A 11/24/2017   Procedure: COLONOSCOPY;  Surgeon: Lin Landsman, MD;  Location: [facility] ENDOSCOPY;  Service: Gastroenterology;  Laterality: N/A;   KNEE SURGERY Right    TONSILLECTOMY      Family History  Problem Relation Age of Onset   Hypertension Mother    Thyroid disease Mother    Hypertension Father     Allergies  Allergen Reactions   Amoxicillin Rash   Effexor [Venlafaxine]  Rash    "whelps"   Ketorolac Itching   Lamotrigine Rash   Sulfa Antibiotics Itching       Latest Ref Rng & Units 09/25/2021    9:29 AM 05/13/2021    9:59 AM 03/31/2021    2:13 PM  CBC  WBC 4.0 - 10.5 K/uL 5.5  5.8  5.3   Hemoglobin 12.0 - 15.0 g/dL 14.0  13.4  12.9   Hematocrit 36.0 - 46.0 % 44.6  41.8  40.8   Platelets 150 - 400 K/uL 279  238  206       CMP     Component Value Date/Time   NA 138 09/25/2021 0929   K 4.2 09/25/2021 0929   CL 108 09/25/2021 0929   CO2 23 09/25/2021 0929   GLUCOSE 139 (H) 09/25/2021 0929   BUN 29 (H) 09/25/2021 0929   CREATININE 1.21 (H) 09/25/2021 0929   CALCIUM 9.4 09/25/2021 0929   PROT 7.3 03/31/2021 1413   ALBUMIN 4.1 03/31/2021 1413   AST 18 03/31/2021 1413   ALT 13 03/31/2021 1413   ALKPHOS 59 03/31/2021 1413   BILITOT 0.7 03/31/2021 1413   GFRNONAA 53 (L) 09/25/2021 0929   GFRAA >60 10/16/2019 0429     No results found.     Assessment & Plan:   1. Leg swelling Recommend:  I have had a long discussion with the patient regarding swelling and why it  causes symptoms.  Patient will begin wearing graduated compression on a daily basis a prescription was given. The patient will  wear the stockings first thing in the morning and removing them in the evening. The patient is instructed specifically not to sleep in the stockings.   In addition, behavioral modification will be initiated.  This will include frequent elevation, use of over the counter pain medications and exercise such as walking.  Consideration for a lymph pump will also be made based upon the effectiveness of conservative therapy.  This would help to improve the edema control and prevent sequela such as ulcers and infections   Patient should undergo duplex ultrasound of the venous system to ensure that DVT or reflux is not present.  The patient will follow-up with me after the ultrasound.   2. Essential hypertension Continue antihypertensive medications as  already ordered, these medications have been reviewed and there are no changes at this time.   Current Outpatient Medications on File Prior to Visit  Medication Sig Dispense Refill   HYDROcodone-acetaminophen (NORCO/VICODIN) 5-325  MG tablet Take 1 tablet by mouth 2 (two) times daily as needed.     ibuprofen (ADVIL) 800 MG tablet Take 1 tablet (800 mg total) by mouth every 8 (eight) hours as needed for moderate pain. 60 tablet 1   Lidocaine (HM LIDOCAINE PATCH) 4 % PTCH Apply 1 patch topically every 12 (twelve) hours. 10 patch 0   lisinopril (ZESTRIL) 10 MG tablet Take 1 tablet (10 mg total) by mouth daily. 30 tablet 1   ondansetron (ZOFRAN) 4 MG tablet Take 1 tablet (4 mg total) by mouth every 8 (eight) hours as needed for up to 10 doses for nausea or vomiting. 10 tablet 0   QUEtiapine (SEROQUEL) 400 MG tablet Take 1 tablet (400 mg total) by mouth at bedtime. 30 tablet 1   hydrocerin (EUCERIN) CREA Apply 1 application topically 3 (three) times daily. (Patient not taking: Reported on 01/11/2021) 113 g 0   potassium chloride (KLOR-CON) 10 MEQ tablet Take 2 tablets (20 mEq total) by mouth 2 (two) times daily for 5 days. 20 tablet 0   [DISCONTINUED] gabapentin (NEURONTIN) 300 MG capsule Take 2 capsules (600 mg total) by mouth 3 (three) times daily. 180 capsule 1   No current facility-administered medications on file prior to visit.    There are no Patient Instructions on file for this visit. No follow-ups on file.   Kris Hartmann, NP

## 2022-05-18 ENCOUNTER — Ambulatory Visit (INDEPENDENT_AMBULATORY_CARE_PROVIDER_SITE_OTHER): Payer: Medicaid Other | Admitting: Nurse Practitioner

## 2022-05-18 ENCOUNTER — Encounter (INDEPENDENT_AMBULATORY_CARE_PROVIDER_SITE_OTHER): Payer: Medicaid Other

## 2022-09-23 ENCOUNTER — Ambulatory Visit: Payer: Medicaid Other | Admitting: Orthopedic Surgery

## 2022-10-12 ENCOUNTER — Ambulatory Visit: Payer: Medicaid Other | Admitting: Orthopedic Surgery

## 2022-10-28 ENCOUNTER — Ambulatory Visit: Payer: Medicaid Other | Admitting: Orthopedic Surgery

## 2022-12-28 ENCOUNTER — Emergency Department: Payer: MEDICAID

## 2022-12-28 ENCOUNTER — Inpatient Hospital Stay
Admission: EM | Admit: 2022-12-28 | Discharge: 2022-12-30 | DRG: 683 | Discharge status: Against Medical Advice | Payer: MEDICAID | Attending: Obstetrics and Gynecology | Admitting: Obstetrics and Gynecology

## 2022-12-28 ENCOUNTER — Other Ambulatory Visit: Payer: Self-pay

## 2022-12-28 ENCOUNTER — Encounter: Payer: Self-pay | Admitting: Emergency Medicine

## 2022-12-28 DIAGNOSIS — N200 Calculus of kidney: Secondary | ICD-10-CM | POA: Diagnosis present

## 2022-12-28 DIAGNOSIS — G8929 Other chronic pain: Secondary | ICD-10-CM | POA: Diagnosis present

## 2022-12-28 DIAGNOSIS — E860 Dehydration: Secondary | ICD-10-CM | POA: Diagnosis present

## 2022-12-28 DIAGNOSIS — I129 Hypertensive chronic kidney disease with stage 1 through stage 4 chronic kidney disease, or unspecified chronic kidney disease: Secondary | ICD-10-CM | POA: Diagnosis present

## 2022-12-28 DIAGNOSIS — E876 Hypokalemia: Secondary | ICD-10-CM | POA: Diagnosis present

## 2022-12-28 DIAGNOSIS — K2970 Gastritis, unspecified, without bleeding: Secondary | ICD-10-CM | POA: Diagnosis present

## 2022-12-28 DIAGNOSIS — D631 Anemia in chronic kidney disease: Secondary | ICD-10-CM | POA: Diagnosis present

## 2022-12-28 DIAGNOSIS — N1831 Chronic kidney disease, stage 3a: Secondary | ICD-10-CM | POA: Diagnosis present

## 2022-12-28 DIAGNOSIS — I10 Essential (primary) hypertension: Secondary | ICD-10-CM | POA: Diagnosis not present

## 2022-12-28 DIAGNOSIS — N179 Acute kidney failure, unspecified: Principal | ICD-10-CM | POA: Diagnosis present

## 2022-12-28 DIAGNOSIS — F1910 Other psychoactive substance abuse, uncomplicated: Secondary | ICD-10-CM | POA: Insufficient documentation

## 2022-12-28 DIAGNOSIS — E871 Hypo-osmolality and hyponatremia: Secondary | ICD-10-CM | POA: Diagnosis present

## 2022-12-28 DIAGNOSIS — Z882 Allergy status to sulfonamides status: Secondary | ICD-10-CM | POA: Diagnosis not present

## 2022-12-28 DIAGNOSIS — F1410 Cocaine abuse, uncomplicated: Secondary | ICD-10-CM | POA: Diagnosis present

## 2022-12-28 DIAGNOSIS — F209 Schizophrenia, unspecified: Secondary | ICD-10-CM | POA: Diagnosis present

## 2022-12-28 DIAGNOSIS — Z8249 Family history of ischemic heart disease and other diseases of the circulatory system: Secondary | ICD-10-CM | POA: Diagnosis not present

## 2022-12-28 DIAGNOSIS — R911 Solitary pulmonary nodule: Secondary | ICD-10-CM | POA: Insufficient documentation

## 2022-12-28 DIAGNOSIS — D72829 Elevated white blood cell count, unspecified: Secondary | ICD-10-CM | POA: Diagnosis present

## 2022-12-28 DIAGNOSIS — Z5329 Procedure and treatment not carried out because of patient's decision for other reasons: Secondary | ICD-10-CM | POA: Diagnosis present

## 2022-12-28 DIAGNOSIS — Z880 Allergy status to penicillin: Secondary | ICD-10-CM

## 2022-12-28 DIAGNOSIS — Z8349 Family history of other endocrine, nutritional and metabolic diseases: Secondary | ICD-10-CM

## 2022-12-28 DIAGNOSIS — Z79899 Other long term (current) drug therapy: Secondary | ICD-10-CM | POA: Diagnosis not present

## 2022-12-28 DIAGNOSIS — Z635 Disruption of family by separation and divorce: Secondary | ICD-10-CM | POA: Diagnosis not present

## 2022-12-28 DIAGNOSIS — Z90710 Acquired absence of both cervix and uterus: Secondary | ICD-10-CM | POA: Diagnosis not present

## 2022-12-28 DIAGNOSIS — I083 Combined rheumatic disorders of mitral, aortic and tricuspid valves: Secondary | ICD-10-CM | POA: Diagnosis not present

## 2022-12-28 DIAGNOSIS — G629 Polyneuropathy, unspecified: Secondary | ICD-10-CM | POA: Diagnosis present

## 2022-12-28 DIAGNOSIS — J45909 Unspecified asthma, uncomplicated: Secondary | ICD-10-CM | POA: Diagnosis present

## 2022-12-28 DIAGNOSIS — I76 Septic arterial embolism: Secondary | ICD-10-CM | POA: Diagnosis present

## 2022-12-28 DIAGNOSIS — I39 Endocarditis and heart valve disorders in diseases classified elsewhere: Secondary | ICD-10-CM | POA: Diagnosis not present

## 2022-12-28 DIAGNOSIS — I38 Endocarditis, valve unspecified: Secondary | ICD-10-CM | POA: Diagnosis not present

## 2022-12-28 DIAGNOSIS — F319 Bipolar disorder, unspecified: Secondary | ICD-10-CM | POA: Diagnosis present

## 2022-12-28 LAB — HEPATIC FUNCTION PANEL
ALT: 19 U/L (ref 0–44)
AST: 18 U/L (ref 15–41)
Albumin: 4.1 g/dL (ref 3.5–5.0)
Alkaline Phosphatase: 59 U/L (ref 38–126)
Bilirubin, Direct: <0.1 mg/dL (ref 0.0–0.2)
Total Bilirubin: 0.4 mg/dL (ref 0.3–1.2)
Total Protein: 7.6 g/dL (ref 6.5–8.1)

## 2022-12-28 LAB — BASIC METABOLIC PANEL
Anion gap: 19 — ABNORMAL HIGH (ref 5–15)
BUN: 103 mg/dL — ABNORMAL HIGH (ref 6–20)
CO2: 19 mmol/L — ABNORMAL LOW (ref 22–32)
Calcium: 7.3 mg/dL — ABNORMAL LOW (ref 8.9–10.3)
Chloride: 92 mmol/L — ABNORMAL LOW (ref 98–111)
Creatinine, Ser: 5.28 mg/dL — ABNORMAL HIGH (ref 0.44–1.00)
GFR, Estimated: 9 mL/min — ABNORMAL LOW (ref >60)
Glucose, Bld: 156 mg/dL — ABNORMAL HIGH (ref 70–99)
Potassium: 3.3 mmol/L — ABNORMAL LOW (ref 3.5–5.1)
Sodium: 130 mmol/L — ABNORMAL LOW (ref 135–145)

## 2022-12-28 LAB — URINALYSIS, ROUTINE W REFLEX MICROSCOPIC
Bilirubin Urine: NEGATIVE
Glucose, UA: NEGATIVE mg/dL
Ketones, ur: NEGATIVE mg/dL
Nitrite: NEGATIVE
Protein, ur: 100 mg/dL — AB
Specific Gravity, Urine: 1.009 (ref 1.005–1.030)
pH: 5 (ref 5.0–8.0)

## 2022-12-28 LAB — URINE DRUG SCREEN, QUALITATIVE (ARMC ONLY)
Amphetamines, Ur Screen: NOT DETECTED
Barbiturates, Ur Screen: NOT DETECTED
Benzodiazepine, Ur Scrn: NOT DETECTED
Cannabinoid 50 Ng, Ur ~~LOC~~: NOT DETECTED
Cocaine Metabolite,Ur ~~LOC~~: NOT DETECTED
MDMA (Ecstasy)Ur Screen: NOT DETECTED
Methadone Scn, Ur: NOT DETECTED
Opiate, Ur Screen: POSITIVE — AB
Phencyclidine (PCP) Ur S: NOT DETECTED
Tricyclic, Ur Screen: NOT DETECTED

## 2022-12-28 LAB — CBC
HCT: 43.3 % (ref 36.0–46.0)
Hemoglobin: 14 g/dL (ref 12.0–15.0)
MCH: 27.9 pg (ref 26.0–34.0)
MCHC: 32.3 g/dL (ref 30.0–36.0)
MCV: 86.3 fL (ref 80.0–100.0)
Platelets: 383 10*3/uL (ref 150–400)
RBC: 5.02 MIL/uL (ref 3.87–5.11)
RDW: 13.5 % (ref 11.5–15.5)
WBC: 16.2 10*3/uL — ABNORMAL HIGH (ref 4.0–10.5)
nRBC: 0 % (ref 0.0–0.2)

## 2022-12-28 LAB — COMPREHENSIVE METABOLIC PANEL
ALT: 16 U/L (ref 0–44)
AST: 16 U/L (ref 15–41)
Albumin: 3.6 g/dL (ref 3.5–5.0)
Alkaline Phosphatase: 50 U/L (ref 38–126)
Anion gap: 14 (ref 5–15)
BUN: 86 mg/dL — ABNORMAL HIGH (ref 6–20)
CO2: 17 mmol/L — ABNORMAL LOW (ref 22–32)
Calcium: 7 mg/dL — ABNORMAL LOW (ref 8.9–10.3)
Chloride: 103 mmol/L (ref 98–111)
Creatinine, Ser: 3.21 mg/dL — ABNORMAL HIGH (ref 0.44–1.00)
GFR, Estimated: 16 mL/min — ABNORMAL LOW (ref >60)
Glucose, Bld: 138 mg/dL — ABNORMAL HIGH (ref 70–99)
Potassium: 3.3 mmol/L — ABNORMAL LOW (ref 3.5–5.1)
Sodium: 134 mmol/L — ABNORMAL LOW (ref 135–145)
Total Bilirubin: 0.2 mg/dL — ABNORMAL LOW (ref 0.3–1.2)
Total Protein: 6.6 g/dL (ref 6.5–8.1)

## 2022-12-28 LAB — CK: Total CK: 495 U/L — ABNORMAL HIGH (ref 38–234)

## 2022-12-28 LAB — ETHANOL: Alcohol, Ethyl (B): <10 mg/dL (ref <10)

## 2022-12-28 LAB — LACTIC ACID, PLASMA
Lactic Acid, Venous: 0.7 mmol/L (ref 0.5–1.9)
Lactic Acid, Venous: 1.6 mmol/L (ref 0.5–1.9)

## 2022-12-28 LAB — CREATININE, URINE, RANDOM: Creatinine, Urine: 69 mg/dL

## 2022-12-28 LAB — SODIUM, URINE, RANDOM: Sodium, Ur: 34 mmol/L

## 2022-12-28 MED ORDER — ONDANSETRON HCL 4 MG/2ML IJ SOLN
4.0000 mg | Freq: Once | INTRAMUSCULAR | Status: AC
  Administered 2022-12-28: 4 mg via INTRAVENOUS
  Filled 2022-12-28: qty 2

## 2022-12-28 MED ORDER — FENTANYL CITRATE PF 50 MCG/ML IJ SOSY
50.0000 ug | PREFILLED_SYRINGE | Freq: Once | INTRAMUSCULAR | Status: AC
  Administered 2022-12-28: 50 ug via INTRAVENOUS
  Filled 2022-12-28: qty 1

## 2022-12-28 MED ORDER — HYDROCODONE-ACETAMINOPHEN 5-325 MG PO TABS
1.0000 | ORAL_TABLET | Freq: Four times a day (QID) | ORAL | Status: DC | PRN
  Administered 2022-12-29 (×2): 1 via ORAL
  Filled 2022-12-28 (×2): qty 1

## 2022-12-28 MED ORDER — HYDROCODONE-ACETAMINOPHEN 5-325 MG PO TABS
1.0000 | ORAL_TABLET | Freq: Two times a day (BID) | ORAL | Status: DC | PRN
  Administered 2022-12-28: 1 via ORAL
  Filled 2022-12-28: qty 1

## 2022-12-28 MED ORDER — POTASSIUM CHLORIDE IN NACL 20-0.9 MEQ/L-% IV SOLN
INTRAVENOUS | Status: DC
  Filled 2022-12-28 (×2): qty 1000

## 2022-12-28 MED ORDER — ONDANSETRON HCL 4 MG PO TABS
4.0000 mg | ORAL_TABLET | Freq: Four times a day (QID) | ORAL | Status: DC | PRN
  Administered 2022-12-29: 4 mg via ORAL
  Filled 2022-12-28: qty 1

## 2022-12-28 MED ORDER — PANTOPRAZOLE SODIUM 40 MG IV SOLR
40.0000 mg | Freq: Two times a day (BID) | INTRAVENOUS | Status: DC
  Administered 2022-12-28 – 2022-12-30 (×4): 40 mg via INTRAVENOUS
  Filled 2022-12-28 (×4): qty 10

## 2022-12-28 MED ORDER — MORPHINE SULFATE (PF) 2 MG/ML IV SOLN
2.0000 mg | Freq: Once | INTRAVENOUS | Status: AC
  Administered 2022-12-28: 2 mg via INTRAVENOUS
  Filled 2022-12-28: qty 1

## 2022-12-28 MED ORDER — HEPARIN SODIUM (PORCINE) 5000 UNIT/ML IJ SOLN
5000.0000 [IU] | Freq: Three times a day (TID) | INTRAMUSCULAR | Status: DC
  Administered 2022-12-28 – 2022-12-30 (×5): 5000 [IU] via SUBCUTANEOUS
  Filled 2022-12-28 (×5): qty 1

## 2022-12-28 MED ORDER — MORPHINE SULFATE (PF) 4 MG/ML IV SOLN
4.0000 mg | Freq: Once | INTRAVENOUS | Status: AC
  Administered 2022-12-28: 4 mg via INTRAVENOUS
  Filled 2022-12-28: qty 1

## 2022-12-28 MED ORDER — SODIUM CHLORIDE 0.9 % IV BOLUS
1000.0000 mL | Freq: Once | INTRAVENOUS | Status: AC
  Administered 2022-12-28: 1000 mL via INTRAVENOUS

## 2022-12-28 MED ORDER — ENOXAPARIN SODIUM 30 MG/0.3ML IJ SOSY: 30.0000 mg | PREFILLED_SYRINGE | INTRAMUSCULAR | Status: DC

## 2022-12-28 MED ORDER — POTASSIUM CHLORIDE 2 MEQ/ML IV SOLN
INTRAVENOUS | Status: DC
  Filled 2022-12-28 (×7): qty 1000

## 2022-12-28 MED ORDER — ONDANSETRON HCL 4 MG/2ML IJ SOLN: 4.0000 mg | Freq: Four times a day (QID) | INTRAMUSCULAR | Status: DC | PRN

## 2022-12-28 MED ORDER — TRAZODONE HCL 50 MG PO TABS
50.0000 mg | ORAL_TABLET | Freq: Every evening | ORAL | Status: DC | PRN
  Administered 2022-12-28 – 2022-12-29 (×2): 50 mg via ORAL
  Filled 2022-12-28 (×2): qty 1

## 2022-12-28 MED ORDER — SODIUM CHLORIDE 0.9 % IV SOLN: INTRAVENOUS | Status: DC

## 2022-12-28 MED ORDER — POTASSIUM CHLORIDE CRYS ER 20 MEQ PO TBCR
40.0000 meq | EXTENDED_RELEASE_TABLET | Freq: Once | ORAL | Status: AC
  Administered 2022-12-28: 40 meq via ORAL
  Filled 2022-12-28: qty 2

## 2022-12-28 NOTE — ED Notes (Signed)
Pt's bladder scan 63mL urine in bladder. Dr. Anner Crete notified.

## 2022-12-28 NOTE — ED Notes (Signed)
Upon assessment of this patient, this RN noted that patient was spitting up gray sputum. The patient was complaining that she had been spitting up "froth" for a while now. MD Wells notified.

## 2022-12-28 NOTE — ED Notes (Signed)
Red lab tube sent to lab at this time.

## 2022-12-28 NOTE — ED Triage Notes (Addendum)
Pt via POV from home. Pt c/o NVD for the past 5 days with dizziness. States she had a near syncopal episode this morning. States she has a hx of fentanyl use, last dose was 2-3 hours ago. Denies any other drug use or ETOH use. Pt c/o lower back pain. Pt is A&OX4 and NAD

## 2022-12-28 NOTE — Assessment & Plan Note (Signed)
Patient self admits to chronic illicit narcotic use as well as cocaine use Urine drug screen pending Patient frequently requesting narcotics at present Will minimize use Withdrawal protocol as needed Will otherwise follow closely

## 2022-12-28 NOTE — Assessment & Plan Note (Signed)
BP stable Hold ACE inhibitor/ARB in the setting of AKI Monitor

## 2022-12-28 NOTE — ED Notes (Signed)
Pt. Placed on bed pan, states she feel like she can urinate.

## 2022-12-28 NOTE — Assessment & Plan Note (Signed)
Creatinine 5.3 today with GFR in the teens in the setting of ACE inhibitor and high-dose NSAID use Positive decreased urine output though not completely an uric Markedly dry on exam Urinalysis pending Will hydrate patient overnight Renal imaging grossly stable on CT scan today Dr. Thedore Mins with nephrology consulted Check FENa Hold offending agents including ACE inhibitor and NSAID Monitor renal function overnight

## 2022-12-28 NOTE — Progress Notes (Signed)
PHARMACIST - PHYSICIAN COMMUNICATION  CONCERNING:  Enoxaparin (Lovenox) for DVT Prophylaxis    RECOMMENDATION: Patient was prescribed enoxaparin 40mg  q24 hours for VTE prophylaxis.   Filed Weights   12/28/22 1017  Weight: 63.5 kg (140 lb)    Body mass index is 25.61 kg/m.  Estimated Creatinine Clearance: 10.4 mL/min (A) (by C-G formula based on SCr of 5.28 mg/dL (H)).  Patient is candidate for enoxaparin 30mg  every 24 hours based on CrCl <71ml/min or Weight <45kg  DESCRIPTION: Pharmacy has adjusted enoxaparin dose per [facility] policy.  Patient is now receiving enoxaparin 30 mg every 24 hours   Tressie Ellis 12/28/2022 4:59 PM

## 2022-12-28 NOTE — Progress Notes (Signed)
       CROSS COVER NOTE  NAME: Teresa Jefferson MRN: [id number] DOB : 1965/11/13    Concern as stated by nurse / staff    Pt is requesting pain medication and gabapentin. Pain medication is only schedule twice a day. Pt recently had it at 1930. BLE pain. Pt states she needs a knee replacement and surgery supposed to be sept 25 Pain in legs and back as well  Pain 9/10    Pertinent findings on chart review: Patient with continued severe pain. Required several doses of morphine and fentanyl initially in ED. Polysubstance abuse history, assume narcotic tolerance  Assessment and  Interventions   Assessment:    12/28/2022    4:33 PM 12/28/2022    3:00 PM 12/28/2022    2:30 PM  Vitals with BMI  Systolic 117 112 322  Diastolic 86 77 67  Pulse 68      Plan: Hydrocodone 5/325 changed to every 6h prn Morphine 2 mg IV x 1 dose       Donnie Mesa NP [facility] Cross Cover 7pm-7am - check amion for availability Pager [phone]

## 2022-12-28 NOTE — Assessment & Plan Note (Addendum)
White count 16 on presentation in the setting of severe AKI Patient markedly dry Chest x-ray with noted small bilateral upper lobe nodules concerning for possible septic emboli in the setting of polysubstance abuse Blood cultures and 2D echo ordered Discussed preliminarily with Dr. Noralee Space Will hold off on empiric antibiotics for now Reassess if patient spikes a fever Follow closely

## 2022-12-28 NOTE — ED Provider Notes (Signed)
[facility] Provider Note    Event Date/Time   First MD Initiated Contact with Patient 12/28/22 1144     (approximate)   History   Dizziness and Emesis   HPI Teresa Jefferson is a 57 y.o. female presenting today for lightheadedness and vomiting.  Patient notes over the past 5 days she has had nausea, vomiting, diarrhea.  She has started noticing some pain on her left flank as well.  She notes that she has had no urine output in the past 2 days and last time she did it was tea colored.  She denies any fevers but has felt some chills intermittently.  She denies shortness of breath, chest pain.  Does have chronic medical history of high blood pressure.  Also notes recently to taking oxycodone for left knee pain.  In triage, she noted a fentanyl use 2 to 3 hours ago, but with me she denies this.  She denies tobacco or alcohol use.     Physical Exam   Triage Vital Signs: ED Triage Vitals  Encounter Vitals Group     BP 12/28/22 1016 91/69     Systolic BP Percentile --      Diastolic BP Percentile --      Pulse Rate 12/28/22 1018 81     Resp 12/28/22 1016 18     Temp 12/28/22 1016 98 F (36.7 C)     Temp Source 12/28/22 1016 Oral     SpO2 12/28/22 1016 96 %     Weight 12/28/22 1017 140 lb (63.5 kg)     Height 12/28/22 1017 5\' 2"  (1.575 m)     Head Circumference --      Peak Flow --      Pain Score 12/28/22 1017 8     Pain Loc --      Pain Education --      Exclude from Growth Chart --     Most recent vital signs: Vitals:   12/28/22 1018 12/28/22 1350  BP:  95/62  Pulse: 81 64  Resp:  18  Temp:  97.6 F (36.4 C)  SpO2:  98%   Physical Exam: I have reviewed the vital signs and nursing notes. General: Awake, alert, no acute distress.  Frail appearing. Head:  Atraumatic, normocephalic.   ENT:  EOM intact, PERRL. Oral mucosa is pink and moist with no lesions. Neck: Neck is supple with full range of motion, No meningeal signs. Cardiovascular:   RRR, No murmurs. Peripheral pulses palpable and equal bilaterally. Respiratory:  Symmetrical chest wall expansion.  No rhonchi, rales, or wheezes.  Good air movement throughout.  No use of accessory muscles.   Musculoskeletal:  No cyanosis or edema. Moving extremities with full ROM Abdomen:  Soft, mild tenderness to palpation along left upper quadrant and left lower quadrant. nondistended.  Mild left-sided CVA tenderness palpation Neuro:  GCS 15, moving all four extremities, interacting appropriately. Speech clear. Psych:  Calm, appropriate.   Skin:  Warm, dry, no rash.     ED Results / Procedures / Treatments   Labs (all labs ordered are listed, but only abnormal results are displayed) Labs Reviewed  BASIC METABOLIC PANEL - Abnormal; Notable for the following components:      Result Value   Sodium 130 (*)    Potassium 3.3 (*)    Chloride 92 (*)    CO2 19 (*)    Glucose, Bld 156 (*)    BUN 103 (*)  Creatinine, Ser 5.28 (*)    Calcium 7.3 (*)    GFR, Estimated 9 (*)    Anion gap 19 (*)    All other components within normal limits  CBC - Abnormal; Notable for the following components:   WBC 16.2 (*)    All other components within normal limits  CULTURE, BLOOD (ROUTINE X 2)  CULTURE, BLOOD (ROUTINE X 2)  URINALYSIS, ROUTINE W REFLEX MICROSCOPIC  URINE DRUG SCREEN, QUALITATIVE ([facility] ONLY)  CBG MONITORING, ED     EKG My EKG interpretation: Rate of 102, sinus tachycardia, normal axis, normal intervals.  No acute ST elevations or depressions   RADIOLOGY See ED course for my radiology interpretations   PROCEDURES:  Critical Care performed: No  Procedures   MEDICATIONS ORDERED IN ED: Medications  sodium chloride 0.9 % bolus 1,000 mL (0 mLs Intravenous Stopped 12/28/22 1427)  ondansetron (ZOFRAN) injection 4 mg (4 mg Intravenous Given 12/28/22 1232)  morphine (PF) 4 MG/ML injection 4 mg (4 mg Intravenous Given 12/28/22 1232)  potassium chloride SA (KLOR-CON M) CR  tablet 40 mEq (40 mEq Oral Given 12/28/22 1257)  fentaNYL (SUBLIMAZE) injection 50 mcg (50 mcg Intravenous Given 12/28/22 1425)  sodium chloride 0.9 % bolus 1,000 mL (1,000 mLs Intravenous New Bag/Given 12/28/22 1447)     IMPRESSION / MDM / ASSESSMENT AND PLAN / ED COURSE  I reviewed the triage vital signs and the nursing notes.                              Differential diagnosis includes, but is not limited to, acute renal failure secondary to dehydration, gastroenteritis, diverticulitis, gastritis, ATN, pneumonia.  Patient's presentation is most consistent with acute presentation with potential threat to life or bodily function.  Patient is a 57 year old female presenting today for lightheadedness, dehydration with 5 days of nausea, vomiting, diarrhea.  Laboratory workup shows acute renal failure and bladder scan with nothing in the bladder.  Suspect secondary to severe dehydration and poor p.o. intake.  CT abdomen/pelvis shows no acute abnormalities aside from likely gastritis which would explain the vomiting and diarrhea symptoms.  A chest x-ray performed and read by the radiologist showed concern for 1 spot in the right upper lobe that could represent a cavitary lesion.  However, patient not really having any shortness of breath or cough symptoms.  She was asked on multiple instances for any history of IV drug use and adamantly denies this.  Patient was admitted to hospitalist service for further care after receiving 2 L of fluid, pain medicine, nausea medicine, and potassium repletion.  The patient is on the cardiac monitor to evaluate for evidence of arrhythmia and/or significant heart rate changes. Clinical Course as of 12/28/22 1527  Wed Dec 28, 2022  1212 DG Chest Portable 1 View Per my interpretation: No acute abnormalities [DW]  1229 68 mL seen on bladder scan [DW]  1433 CT ABDOMEN PELVIS WO CONTRAST My interpretation: No acute intracranial abnormalities [DW]  1435 Asked patient  again and she adamantly denies any IV drug use [DW]  1437 Will admit to hospitalist for further care. [DW]    Clinical Course User Index [DW] Janith Lima, MD     FINAL CLINICAL IMPRESSION(S) / ED DIAGNOSES   Final diagnoses:  Acute renal failure, unspecified acute renal failure type (HCC)  Hypokalemia     Rx / DC Orders   ED Discharge Orders     None  Note:  This document was prepared using Dragon voice recognition software and may include unintentional dictation errors.   Janith Lima, MD 12/28/22 [phone]

## 2022-12-28 NOTE — H&P (Addendum)
History and Physical    Patient: Teresa Jefferson GNF:[id number] DOB: 1965/05/28 DOA: 12/28/2022 DOS: the patient was seen and examined on 12/28/2022 PCP: [facility] [place]  Patient coming from: Home  Chief Complaint:  Chief Complaint  Patient presents with   Dizziness   Emesis   HPI: Teresa Jefferson is a 57 y.o. female with medical history significant of polysubstance abuse, hypertension, asthma, bipolar disorder presenting with AKI, leukocytosis, polysubstance abuse.  Patient reports progressively worsening nausea vomiting and decreased urine output over multiple days.  Patient noted to have been recently in and out of jail in the legal system.  Is pending a court appearance with a ankle monitor in place.  Patient reports having generalized malaise over several days to weeks.  Does admit to illicit narcotic use as well as crack cocaine use.  Denies any IV drug use.  States that she has been taking lisinopril as well as 800 mg of ibuprofen 3 times a day for several months.  No chest pain or shortness of breath.  No orthopnea.  Minimal lower extremity swelling.  Denies any significant abdominal pain.  Has had some significant worsening nausea and vomiting.  Emesis not reported to be bloody or bilious.  No focal hemiparesis or confusion. Presented to the ER afebrile, blood pressure 90s to 110s over 60s to 80s.  Satting well on room air.  White count 16, hemoglobin 14, platelets 383, creatinine 5.3 with GFR of less than 9.  Bicarb of 19.  Potassium 3.3.  Sodium 130. Review of Systems: As mentioned in the history of present illness. All other systems reviewed and are negative. Past Medical History:  Diagnosis Date   Asthma    Bipolar 1 disorder (HCC)    Depression    Hypertension    Neuropathy    Schizophrenia (HCC)    Thyroid disease    Past Surgical History:  Procedure Laterality Date   ABDOMINAL HYSTERECTOMY     COLONOSCOPY N/A 11/23/2017   Procedure: COLONOSCOPY;  Surgeon:  Toney Reil, MD;  Location: [facility] ENDOSCOPY;  Service: Gastroenterology;  Laterality: N/A;   COLONOSCOPY N/A 11/24/2017   Procedure: COLONOSCOPY;  Surgeon: Toney Reil, MD;  Location: [facility] ENDOSCOPY;  Service: Gastroenterology;  Laterality: N/A;   KNEE SURGERY Right    TONSILLECTOMY     Social History:  reports that she has never smoked. She has never used smokeless tobacco. She reports current alcohol use. She reports that she does not currently use drugs.  Allergies  Allergen Reactions   Amoxicillin Rash   Effexor [Venlafaxine] Rash    "whelps"   Ketorolac Itching   Lamotrigine Rash   Sulfa Antibiotics Itching    Family History  Problem Relation Age of Onset   Hypertension Mother    Thyroid disease Mother    Hypertension Father     Prior to Admission medications   Medication Sig Start Date End Date Taking? Authorizing Provider  hydrocerin (EUCERIN) CREA Apply 1 application topically 3 (three) times daily. Patient not taking: Reported on 01/11/2021 10/17/19   Enedina Finner, MD  HYDROcodone-acetaminophen (NORCO/VICODIN) 5-325 MG tablet Take 1 tablet by mouth 2 (two) times daily as needed. 12/14/20   [provider]  ibuprofen (ADVIL) 800 MG tablet Take 1 tablet (800 mg total) by mouth every 8 (eight) hours as needed for moderate pain. 11/26/20   Triplett, Cari B, FNP  Lidocaine (HM LIDOCAINE PATCH) 4 % PTCH Apply 1 patch topically every 12 (twelve) hours. 12/31/20  Lucy Chris, PA  lisinopril (ZESTRIL) 10 MG tablet Take 1 tablet (10 mg total) by mouth daily. 12/21/18   [name], Jackquline Denmark, MD  ondansetron (ZOFRAN) 4 MG tablet Take 1 tablet (4 mg total) by mouth every 8 (eight) hours as needed for up to 10 doses for nausea or vomiting. 08/10/20   Gilles Chiquito, MD  potassium chloride (KLOR-CON) 10 MEQ tablet Take 2 tablets (20 mEq total) by mouth 2 (two) times daily for 5 days. 05/13/21 05/18/21  Georga Hacking, MD  QUEtiapine (SEROQUEL) 400 MG tablet Take 1  tablet (400 mg total) by mouth at bedtime. 12/21/18   [name], Jackquline Denmark, MD  gabapentin (NEURONTIN) 300 MG capsule Take 2 capsules (600 mg total) by mouth 3 (three) times daily. 12/21/18 08/10/20  [name], Jackquline Denmark, MD    Physical Exam: Vitals:   12/28/22 1400 12/28/22 1430 12/28/22 1500 12/28/22 1633  BP: 96/72 112/67 112/77 117/86  Pulse:    68  Resp:    17  Temp:    98.1 F (36.7 C)  TempSrc:      SpO2:    96%  Weight:      Height:       Physical Exam Constitutional:      Appearance: She is normal weight.  HENT:     Head: Normocephalic and atraumatic.     Nose: Nose normal.     Mouth/Throat:     Mouth: Mucous membranes are dry.  Cardiovascular:     Rate and Rhythm: Normal rate and regular rhythm.  Pulmonary:     Effort: Pulmonary effort is normal.  Abdominal:     General: Bowel sounds are normal.  Musculoskeletal:        General: Normal range of motion.  Skin:    General: Skin is dry.  Neurological:     General: No focal deficit present.  Psychiatric:        Mood and Affect: Mood normal.     Data Reviewed:  There are no new results to review at this time. CT ABDOMEN PELVIS WO CONTRAST CLINICAL DATA:  Left-sided flank pain and back pain.  Oliguria  EXAM: CT ABDOMEN AND PELVIS WITHOUT CONTRAST  TECHNIQUE: Multidetector CT imaging of the abdomen and pelvis was performed following the standard protocol without IV contrast.  RADIATION DOSE REDUCTION: This exam was performed according to the departmental dose-optimization program which includes automated exposure control, adjustment of the mA and/or kV according to patient size and/or use of iterative reconstruction technique.  COMPARISON:  CT with contrast 08/10/2020.  Older exams as well  FINDINGS: Lower chest: No pleural effusion lung bases. The small nodule in the right costophrenic angle laterally on the prior examination is smaller today. No additional follow-up.  Hepatobiliary: Focal fat deposition seen  in the liver in segment 4 adjacent to the falciform ligament previous cholecystectomy  Pancreas: Unremarkable. No pancreatic ductal dilatation or surrounding inflammatory changes.  Spleen: Normal in size without focal abnormality.  Adrenals/Urinary Tract: The adrenal glands are preserved. There is a punctate nonobstructing upper pole left-sided renal stone best seen on coronal series 5, image 47. Lobular appearance of the kidneys. No ureteral stones preserved contours of the urinary bladder.  Stomach/Bowel: Moderate colonic stool. On this non oral contrast exam, large bowel has a normal course and caliber. Few sigmoid colon diverticula. Normal appendix. Small bowel is nondilated. There is some fluid and debris in the stomach there is some wall thickening of the proximal duodenal with the  adjacent inflammatory stranding. No adjacent free air or fluid collection but please correlate for clinical evidence of duodenitis  Vascular/Lymphatic: Aortic atherosclerosis. No enlarged abdominal or pelvic lymph nodes.  Reproductive: Status post hysterectomy. No adnexal masses.  Other: No abdominal wall hernia or abnormality. No abdominopelvic ascites.  Musculoskeletal: Degenerative changes seen along the spine and pelvis. Trace anterolisthesis of L4 on L5  IMPRESSION: Mild wall thickening along the proximal duodenal with the adjacent inflammatory stranding please correlate for gastritis or duodenitis. No free air or free fluid. Recommend further evaluation.  Punctate nonobstructing left-sided renal stone.  Normal appendix.  Previous cholecystectomy  Sigmoid colon diverticula  Electronically Signed   By: Karen Kays M.D.   On: 12/28/2022 14:20 DG Chest Portable 1 View CLINICAL DATA:  Shortness of breath, left-sided chest pain  EXAM: PORTABLE CHEST 1 VIEW  COMPARISON:  09/25/2021  FINDINGS: 2 mm left upper lobe pulmonary nodule. 2 mm right upper lobe pulmonary nodule with  possible central area of cavitation. No focal consolidation. No pleural effusion or pneumothorax. Heart and mediastinal contours are unremarkable.  No acute osseous abnormality.  IMPRESSION: 1. No acute cardiopulmonary disease. 2. A 2 mm left upper lobe pulmonary nodule. 2 mm right upper lobe pulmonary nodule with possible central area of cavitation. Findings concerning for an infectious etiology such as septic emboli. Recommend further evaluation with a CT of the chest with intravenous contrast.  Electronically Signed   By: Elige Ko M.D.   On: 12/28/2022 13:38  Lab Results  Component Value Date   WBC 16.2 (H) 12/28/2022   HGB 14.0 12/28/2022   HCT 43.3 12/28/2022   MCV 86.3 12/28/2022   PLT 383 12/28/2022   Last metabolic panel Lab Results  Component Value Date   GLUCOSE 156 (H) 12/28/2022   NA 130 (L) 12/28/2022   K 3.3 (L) 12/28/2022   CL 92 (L) 12/28/2022   CO2 19 (L) 12/28/2022   BUN 103 (H) 12/28/2022   CREATININE 5.28 (H) 12/28/2022   GFRNONAA 9 (L) 12/28/2022   CALCIUM 7.3 (L) 12/28/2022   PROT 7.3 03/31/2021   ALBUMIN 4.1 03/31/2021   BILITOT 0.7 03/31/2021   ALKPHOS 59 03/31/2021   AST 18 03/31/2021   ALT 13 03/31/2021   ANIONGAP 19 (H) 12/28/2022    Assessment and Plan: * AKI (acute kidney injury) (HCC) Creatinine 5.3 today with GFR in the teens in the setting of ACE inhibitor and high-dose NSAID use Positive decreased urine output though not completely an uric Markedly dry on exam Urinalysis pending Will hydrate patient overnight Renal imaging grossly stable on CT scan today Dr. Thedore Mins with nephrology consulted Check FENa Hold offending agents including ACE inhibitor and NSAID Monitor renal function overnight  Polysubstance abuse (HCC) Patient self admits to chronic illicit narcotic use as well as cocaine use Urine drug screen pending Patient frequently requesting narcotics at present Will minimize use Withdrawal protocol as needed Will  otherwise follow closely  Leukocytosis White count 16 on presentation in the setting of severe AKI Patient markedly dry Chest x-ray with noted small bilateral upper lobe nodules concerning for possible septic emboli in the setting of polysubstance abuse Blood cultures and 2D echo ordered Discussed preliminarily with Dr. Noralee Space Will hold off on empiric antibiotics for now Reassess if patient spikes a fever Follow closely    Essential hypertension BP stable Hold ACE inhibitor/ARB in the setting of AKI Monitor      Advance Care Planning:   Code Status: Full Code  Consults: Pending nephrology   Family Communication: No family at the bedside   Severity of Illness: The appropriate patient status for this patient is INPATIENT. Inpatient status is judged to be reasonable and necessary in order to provide the required intensity of service to ensure the patient's safety. The patient's presenting symptoms, physical exam findings, and initial radiographic and laboratory data in the context of their chronic comorbidities is felt to place them at high risk for further clinical deterioration. Furthermore, it is not anticipated that the patient will be medically stable for discharge from the hospital within 2 midnights of admission.   * I certify that at the point of admission it is my clinical judgment that the patient will require inpatient hospital care spanning beyond 2 midnights from the point of admission due to high intensity of service, high risk for further deterioration and high frequency of surveillance required.*  Author: Floydene Flock, MD 12/28/2022 5:12 PM  For on call review [email or web address].

## 2022-12-29 ENCOUNTER — Inpatient Hospital Stay (HOSPITAL_COMMUNITY)
Admit: 2022-12-29 | Discharge: 2022-12-29 | Disposition: A | Discharge status: Home / Self Care | Payer: MEDICAID | Attending: Family Medicine | Admitting: Family Medicine

## 2022-12-29 DIAGNOSIS — I38 Endocarditis, valve unspecified: Secondary | ICD-10-CM

## 2022-12-29 DIAGNOSIS — I39 Endocarditis and heart valve disorders in diseases classified elsewhere: Secondary | ICD-10-CM

## 2022-12-29 DIAGNOSIS — I083 Combined rheumatic disorders of mitral, aortic and tricuspid valves: Secondary | ICD-10-CM

## 2022-12-29 LAB — CBC
HCT: 34.6 % — ABNORMAL LOW (ref 36.0–46.0)
Hemoglobin: 11.7 g/dL — ABNORMAL LOW (ref 12.0–15.0)
MCH: 28.3 pg (ref 26.0–34.0)
MCHC: 33.8 g/dL (ref 30.0–36.0)
MCV: 83.6 fL (ref 80.0–100.0)
Platelets: 201 10*3/uL (ref 150–400)
RBC: 4.14 MIL/uL (ref 3.87–5.11)
RDW: 13.2 % (ref 11.5–15.5)
WBC: 6.8 10*3/uL (ref 4.0–10.5)
nRBC: 0 % (ref 0.0–0.2)

## 2022-12-29 LAB — COMPREHENSIVE METABOLIC PANEL
ALT: 13 U/L (ref 0–44)
AST: 15 U/L (ref 15–41)
Albumin: 3.2 g/dL — ABNORMAL LOW (ref 3.5–5.0)
Alkaline Phosphatase: 46 U/L (ref 38–126)
Anion gap: 9 (ref 5–15)
BUN: 76 mg/dL — ABNORMAL HIGH (ref 6–20)
CO2: 21 mmol/L — ABNORMAL LOW (ref 22–32)
Calcium: 7.3 mg/dL — ABNORMAL LOW (ref 8.9–10.3)
Chloride: 106 mmol/L (ref 98–111)
Creatinine, Ser: 2.45 mg/dL — ABNORMAL HIGH (ref 0.44–1.00)
GFR, Estimated: 23 mL/min — ABNORMAL LOW (ref >60)
Glucose, Bld: 139 mg/dL — ABNORMAL HIGH (ref 70–99)
Potassium: 3.2 mmol/L — ABNORMAL LOW (ref 3.5–5.1)
Sodium: 136 mmol/L (ref 135–145)
Total Bilirubin: 0.6 mg/dL (ref 0.3–1.2)
Total Protein: 6 g/dL — ABNORMAL LOW (ref 6.5–8.1)

## 2022-12-29 LAB — ECHOCARDIOGRAM COMPLETE
AR max vel: 2.52 cm2
AV Area VTI: 2.72 cm2
AV Area mean vel: 2.59 cm2
AV Mean grad: 5 mmHg
AV Peak grad: 13.4 mmHg
Ao pk vel: 1.83 m/s
Area-P 1/2: 2.24 cm2
Height: 62 in
MV VTI: 2.56 cm2
S' Lateral: 2.4 cm
Weight: 2240 [oz_av]

## 2022-12-29 LAB — BLOOD GAS, VENOUS
Acid-base deficit: 1.1 mmol/L (ref 0.0–2.0)
Bicarbonate: 24.8 mmol/L (ref 20.0–28.0)
O2 Saturation: 41.2 %
Patient temperature: 37
pCO2, Ven: 45 mmHg (ref 44–60)
pH, Ven: 7.35 (ref 7.25–7.43)

## 2022-12-29 LAB — HIV ANTIBODY (ROUTINE TESTING W REFLEX): HIV Screen 4th Generation wRfx: NONREACTIVE

## 2022-12-29 MED ORDER — BOOST / RESOURCE BREEZE PO LIQD CUSTOM
1.0000 | Freq: Three times a day (TID) | ORAL | Status: DC
  Administered 2022-12-29 – 2022-12-30 (×3): 1 via ORAL

## 2022-12-29 MED ORDER — OXYCODONE HCL 5 MG PO TABS
5.0000 mg | ORAL_TABLET | Freq: Four times a day (QID) | ORAL | Status: DC | PRN
  Administered 2022-12-29 – 2022-12-30 (×4): 5 mg via ORAL
  Filled 2022-12-29 (×4): qty 1

## 2022-12-29 MED ORDER — HYDROXYZINE HCL 50 MG PO TABS
50.0000 mg | ORAL_TABLET | Freq: Three times a day (TID) | ORAL | Status: DC | PRN
  Administered 2022-12-29 – 2022-12-30 (×2): 50 mg via ORAL
  Filled 2022-12-29 (×3): qty 1

## 2022-12-29 MED ORDER — GABAPENTIN 100 MG PO CAPS
100.0000 mg | ORAL_CAPSULE | Freq: Two times a day (BID) | ORAL | Status: DC
  Administered 2022-12-29 (×2): 100 mg via ORAL
  Filled 2022-12-29 (×3): qty 1

## 2022-12-29 MED ORDER — QUETIAPINE FUMARATE 25 MG PO TABS
25.0000 mg | ORAL_TABLET | Freq: Every day | ORAL | Status: DC
  Administered 2022-12-29: 25 mg via ORAL
  Filled 2022-12-29: qty 1

## 2022-12-29 MED ORDER — QUETIAPINE FUMARATE 200 MG PO TABS: 400.0000 mg | ORAL_TABLET | Freq: Every day | ORAL | Status: DC

## 2022-12-29 NOTE — Plan of Care (Signed)
  Problem: Activity: Goal: Risk for activity intolerance will decrease Outcome: Progressing   Problem: Coping: Goal: Level of anxiety will decrease Outcome: Progressing   Problem: Pain Managment: Goal: General experience of comfort will improve Outcome: Progressing   Problem: Safety: Goal: Ability to remain free from injury will improve Outcome: Progressing   

## 2022-12-29 NOTE — Consult Note (Signed)
[facility]  CONSULT NOTE    Date: 12/29/2022                  Patient Name:  Teresa Jefferson  MRN: [id number]  DOB: 1965/05/26  Age / Sex: 57 y.o., female         PCP: [facility]                 Service Requesting Consult: [facility]                 Reason for Consult: Acute kidney injury            History of Present Illness: Teresa Jefferson is a 57 y.o.  female with past medical concerns including hypertension, bipolar disorder, polysubstance abuse, and asthma, who was admitted to [facility] on 12/28/2022 for Hypokalemia [E87.6] AKI (acute kidney injury) (HCC) [N17.9] Acute renal failure, unspecified acute renal failure type [facility]) [N17.9]  Patient presents to the emergency department complaining of dizziness for 5 days.  Patient is seen sitting up in bed, alert and oriented.  Patient states that she has had weakness and dizziness over the past week.  Also reports nausea with vomiting and inability to hold food or liquid down for 5 days.  Patient states she has chronic knee pain and has been taking medications for management of this pain.  Also states she has been taking ibuprofen 800 mg 3 times a day for the past year.  Patient currently not followed by pain management.  Attempting to acquire a new clinic.  Labs on ED arrival include sodium 130, potassium 3.3, serum bicarb 19, glucose 153, BUN 103, creatinine 5.28 with GFR 9, and WBC 16.2.  UA appears hazy with hematuria and leukocytes.  UDS positive for opiates.  CT abdomen pelvis shows mild wall thickening with adjacent inflammatory stranding, consistent with gastritis.  No acute findings seen on chest x-ray.  Patient given high rate IV fluids.  Creatinine has responded and is now 2.45.  Appetite remains poor.   Medications: Outpatient medications: Medications Prior to Admission  Medication Sig Dispense Refill Last Dose   hydrocerin (EUCERIN) CREA Apply 1 application topically 3 (three) times  daily. 113 g 0 Past Week   ibuprofen (ADVIL) 800 MG tablet Take 1 tablet (800 mg total) by mouth every 8 (eight) hours as needed for moderate pain. 60 tablet 1 prn at unk   lisinopril (ZESTRIL) 10 MG tablet Take 1 tablet (10 mg total) by mouth daily. (Patient taking differently: Take 20 mg by mouth daily.) 30 tablet 1 Past Week   HYDROcodone-acetaminophen (NORCO/VICODIN) 5-325 MG tablet Take 1 tablet by mouth 2 (two) times daily as needed. (Patient not taking: Reported on 12/29/2022)   Not Taking   Lidocaine (HM LIDOCAINE PATCH) 4 % PTCH Apply 1 patch topically every 12 (twelve) hours. (Patient not taking: Reported on 12/29/2022) 10 patch 0 Not Taking   ondansetron (ZOFRAN) 4 MG tablet Take 1 tablet (4 mg total) by mouth every 8 (eight) hours as needed for up to 10 doses for nausea or vomiting. (Patient not taking: Reported on 12/29/2022) 10 tablet 0 Not Taking   potassium chloride (KLOR-CON) 10 MEQ tablet Take 2 tablets (20 mEq total) by mouth 2 (two) times daily for 5 days. 20 tablet 0    QUEtiapine (SEROQUEL) 400 MG tablet Take 1 tablet (400 mg total) by mouth at bedtime. (Patient not taking: Reported on 12/29/2022) 30 tablet 1 Not Taking  Current medications: Current Facility-Administered Medications  Medication Dose Route Frequency Provider Last Rate Last Admin   feeding supplement (BOOST / RESOURCE ) liquid 1 Container  1 Container Oral TID BM Wendee Beavers, NP   1 Container at 12/29/22 1325   gabapentin (NEURONTIN) capsule 100 mg  100 mg Oral BID Kathrynn Running, MD   100 mg at 12/29/22 1326   heparin injection 5,000 Units  5,000 Units Subcutaneous Q8H Floydene Flock, MD   5,000 Units at 12/29/22 1300   lactated ringers 1,000 mL with potassium chloride 10 mEq infusion   Intravenous Continuous Floydene Flock, MD 125 mL/hr at 12/29/22 0640 New Bag at 12/29/22 0640   ondansetron (ZOFRAN) tablet 4 mg  4 mg Oral Q6H PRN Floydene Flock, MD   4 mg at 12/29/22 0603   Or    ondansetron (ZOFRAN) injection 4 mg  4 mg Intravenous Q6H PRN Floydene Flock, MD       oxyCODONE (Oxy IR/ROXICODONE) immediate release tablet 5 mg  5 mg Oral Q6H PRN Kathrynn Running, MD   5 mg at 12/29/22 1118   pantoprazole (PROTONIX) injection 40 mg  40 mg Intravenous Q12H Floydene Flock, MD   40 mg at 12/29/22 0932   QUEtiapine (SEROQUEL) tablet 400 mg  400 mg Oral QHS Wouk, Wilfred Curtis, MD       traZODone (DESYREL) tablet 50 mg  50 mg Oral QHS PRN Manuela Schwartz, NP   50 mg at 12/28/22 2145      Allergies: Allergies  Allergen Reactions   Amoxicillin Rash   Effexor [Venlafaxine] Rash    "whelps"   Ketorolac Itching   Lamotrigine Rash   Sulfa Antibiotics Itching      Past Medical History: Past Medical History:  Diagnosis Date   Asthma    Bipolar 1 disorder (HCC)    Depression    Hypertension    Neuropathy    Schizophrenia (HCC)    Thyroid disease      Past Surgical History: Past Surgical History:  Procedure Laterality Date   ABDOMINAL HYSTERECTOMY     COLONOSCOPY N/A 11/23/2017   Procedure: COLONOSCOPY;  Surgeon: Toney Reil, MD;  Location: [facility] ENDOSCOPY;  Service: Gastroenterology;  Laterality: N/A;   COLONOSCOPY N/A 11/24/2017   Procedure: COLONOSCOPY;  Surgeon: Toney Reil, MD;  Location: [facility] ENDOSCOPY;  Service: Gastroenterology;  Laterality: N/A;   KNEE SURGERY Right    TONSILLECTOMY       Family History: Family History  Problem Relation Age of Onset   Hypertension Mother    Thyroid disease Mother    Hypertension Father      Social History: Social History   Socioeconomic History   Marital status: Legally Separated    Spouse name: Not on file   Number of children: Not on file   Years of education: Not on file   Highest education level: Not on file  Occupational History   Not on file  Tobacco Use   Smoking status: Never   Smokeless tobacco: Never  Vaping Use   Vaping status: Never Used  Substance and Sexual Activity    Alcohol use: Yes   Drug use: Not Currently    Comment: crack and opiates   Sexual activity: Not on file  Other Topics Concern   Not on file  Social History Narrative   Not on file   Social Determinants of Health   Financial Resource Strain: Not on file  Food Insecurity: No  Food Insecurity (12/29/2022)   Hunger Vital Sign    Worried About Running Out of Food in the Last Year: Never true    Ran Out of Food in the Last Year: Never true  Transportation Needs: No Transportation Needs (12/29/2022)   PRAPARE - Administrator, Civil Service (Medical): No    Lack of Transportation (Non-Medical): No  Physical Activity: Not on file  Stress: Not on file  Social Connections: Not on file  Intimate Partner Violence: Not At Risk (12/29/2022)   Humiliation, Afraid, Rape, and Kick questionnaire    Fear of Current or Ex-Partner: No    Emotionally Abused: No    Physically Abused: No    Sexually Abused: No     Review of Systems: Review of Systems  Constitutional:  Negative for chills, fever and malaise/fatigue.  HENT:  Negative for congestion, sore throat and tinnitus.   Eyes:  Negative for blurred vision and redness.  Respiratory:  Negative for cough, shortness of breath and wheezing.   Cardiovascular:  Negative for chest pain, palpitations, claudication and leg swelling.  Gastrointestinal:  Positive for nausea and vomiting. Negative for abdominal pain, blood in stool and diarrhea.  Genitourinary:  Negative for flank pain, frequency and hematuria.  Musculoskeletal:  Negative for back pain, falls and myalgias.  Skin:  Negative for rash.  Neurological:  Positive for dizziness. Negative for weakness and headaches.  Endo/Heme/Allergies:  Does not bruise/bleed easily.  Psychiatric/Behavioral:  Negative for depression. The patient is not nervous/anxious and does not have insomnia.     Vital Signs: Blood pressure 101/66, pulse 75, temperature 98.6 F (37 C), resp. rate 17, height 5'  2" (1.575 m), weight 63.5 kg, SpO2 97%.  Weight trends: Filed Weights   12/28/22 1017  Weight: 63.5 kg    Physical Exam: General: NAD, anxious  Head: Normocephalic, atraumatic. Moist oral mucosal membranes  Eyes: Anicteric  Lungs:  Clear to auscultation  Heart: Regular rate and rhythm  Abdomen:  Soft, nontender, nondistended  Extremities: No peripheral edema.  Neurologic: Nonfocal, moving all four extremities  Skin: No lesions  Access: None     Lab results: Basic Metabolic Panel: Recent Labs  Lab 12/28/22 1019 12/28/22 2211 12/29/22 0347  NA 130* 134* 136  K 3.3* 3.3* 3.2*  CL 92* 103 106  CO2 19* 17* 21*  GLUCOSE 156* 138* 139*  BUN 103* 86* 76*  CREATININE 5.28* 3.21* 2.45*  CALCIUM 7.3* 7.0* 7.3*    Liver Function Tests: Recent Labs  Lab 12/28/22 1724 12/28/22 2211 12/29/22 0347  AST 18 16 15   ALT 19 16 13   ALKPHOS 59 50 46  BILITOT 0.4 0.2* 0.6  PROT 7.6 6.6 6.0*  ALBUMIN 4.1 3.6 3.2*   No results for input(s): "LIPASE", "AMYLASE" in the last 168 hours. No results for input(s): "AMMONIA" in the last 168 hours.  CBC: Recent Labs  Lab 12/28/22 1019 12/29/22 0347  WBC 16.2* 6.8  HGB 14.0 11.7*  HCT 43.3 34.6*  MCV 86.3 83.6  PLT 383 201    Cardiac Enzymes: Recent Labs  Lab 12/28/22 1724  CKTOTAL 495*    BNP: Invalid input(s): "POCBNP"  CBG: No results for input(s): "GLUCAP" in the last 168 hours.  Microbiology: Results for orders placed or performed during the hospital encounter of 12/28/22  Culture, blood (Routine X 2) w Reflex to ID Panel     Status: None (Preliminary result)   Collection Time: 12/28/22  3:47 PM   Specimen: BLOOD  Result Value Ref Range Status   Specimen Description BLOOD RIGHT ANTECUBITAL  Final   Special Requests   Final    BOTTLES DRAWN AEROBIC AND ANAEROBIC Blood Culture adequate volume   Culture   Final    NO GROWTH < 12 HOURS Performed at [facility], [address], [place], [place]  [address]    Report Status PENDING  Incomplete  Culture, blood (Routine X 2) w Reflex to ID Panel     Status: None (Preliminary result)   Collection Time: 12/28/22  4:02 PM   Specimen: BLOOD  Result Value Ref Range Status   Specimen Description BLOOD BLOOD RIGHT WRIST  Final   Special Requests   Final    BOTTLES DRAWN AEROBIC AND ANAEROBIC Blood Culture adequate volume   Culture   Final    NO GROWTH < 12 HOURS Performed at [facility], [address] [place], [place] [address]    Report Status PENDING  Incomplete    Coagulation Studies: No results for input(s): "LABPROT", "INR" in the last 72 hours.  Urinalysis: Recent Labs    12/28/22 1519  COLORURINE YELLOW*  LABSPEC 1.009  PHURINE 5.0  GLUCOSEU NEGATIVE  HGBUR MODERATE*  BILIRUBINUR NEGATIVE  KETONESUR NEGATIVE  PROTEINUR 100*  NITRITE NEGATIVE  LEUKOCYTESUR LARGE*      Imaging: CT ABDOMEN PELVIS WO CONTRAST  Result Date: 12/28/2022 CLINICAL DATA:  Left-sided flank pain and back pain.  Oliguria EXAM: CT ABDOMEN AND PELVIS WITHOUT CONTRAST TECHNIQUE: Multidetector CT imaging of the abdomen and pelvis was performed following the standard protocol without IV contrast. RADIATION DOSE REDUCTION: This exam was performed according to the departmental dose-optimization program which includes automated exposure control, adjustment of the mA and/or kV according to patient size and/or use of iterative reconstruction technique. COMPARISON:  CT with contrast 08/10/2020.  Older exams as well FINDINGS: Lower chest: No pleural effusion lung bases. The small nodule in the right costophrenic angle laterally on the prior examination is smaller today. No additional follow-up. Hepatobiliary: Focal fat deposition seen in the liver in segment 4 adjacent to the falciform ligament previous cholecystectomy Pancreas: Unremarkable. No pancreatic ductal dilatation or surrounding inflammatory changes. Spleen: Normal in size without focal  abnormality. Adrenals/Urinary Tract: The adrenal glands are preserved. There is a punctate nonobstructing upper pole left-sided renal stone best seen on coronal series 5, image 47. Lobular appearance of the kidneys. No ureteral stones preserved contours of the urinary bladder. Stomach/Bowel: Moderate colonic stool. On this non oral contrast exam, large bowel has a normal course and caliber. Few sigmoid colon diverticula. Normal appendix. Small bowel is nondilated. There is some fluid and debris in the stomach there is some wall thickening of the proximal duodenal with the adjacent inflammatory stranding. No adjacent free air or fluid collection but please correlate for clinical evidence of duodenitis Vascular/Lymphatic: Aortic atherosclerosis. No enlarged abdominal or pelvic lymph nodes. Reproductive: Status post hysterectomy. No adnexal masses. Other: No abdominal wall hernia or abnormality. No abdominopelvic ascites. Musculoskeletal: Degenerative changes seen along the spine and pelvis. Trace anterolisthesis of L4 on L5 IMPRESSION: Mild wall thickening along the proximal duodenal with the adjacent inflammatory stranding please correlate for gastritis or duodenitis. No free air or free fluid. Recommend further evaluation. Punctate nonobstructing left-sided renal stone. Normal appendix.  Previous cholecystectomy Sigmoid colon diverticula Electronically Signed   By: Karen Kays M.D.   On: 12/28/2022 14:20   DG Chest Portable 1 View  Result Date: 12/28/2022 CLINICAL DATA:  Shortness of  breath, left-sided chest pain EXAM: PORTABLE CHEST 1 VIEW COMPARISON:  09/25/2021 FINDINGS: 2 mm left upper lobe pulmonary nodule. 2 mm right upper lobe pulmonary nodule with possible central area of cavitation. No focal consolidation. No pleural effusion or pneumothorax. Heart and mediastinal contours are unremarkable. No acute osseous abnormality. IMPRESSION: 1. No acute cardiopulmonary disease. 2. A 2 mm left upper lobe  pulmonary nodule. 2 mm right upper lobe pulmonary nodule with possible central area of cavitation. Findings concerning for an infectious etiology such as septic emboli. Recommend further evaluation with a CT of the chest with intravenous contrast. Electronically Signed   By: Elige Ko M.D.   On: 12/28/2022 13:38     Assessment & Plan: Teresa Jefferson is a 57 y.o.  female with  past medical concerns including hypertension, bipolar disorder, polysubstance abuse, and asthma, who was admitted to [facility] on 12/28/2022 for Hypokalemia [E87.6] AKI (acute kidney injury) (HCC) [N17.9] Acute renal failure, unspecified acute renal failure type (HCC) [N17.9]  1.  Acute kidney injury on chronic kidney disease stage IIIa.  Most recent baseline appears to be a creatinine of 1.21 with GFR 53 on 09/25/2021.  Admits to long-term NSAID use.  Acute kidney injury likely secondary to dehydration and lisinopril.  Lisinopril currently held.  CT abdomen pelvis shows a nonobstructive stone in left kidney.  Creatinine 5.28 on admission, now 2.45.  Agree with continued IV hydration.  Patient encouraged to increase oral intake.  Will order boost 3 times daily for supplementation.  No acute indication for dialysis at this time as patient responding to treatment.  Will continue to monitor.  Patient will need continued monitoring by nephrology at discharge.  2. Hyponatremia/hypokalemia.  [place] secondary to dehydration.  Sodium 130 on admission, correcting with IV fluids.  Potassium also 3.3 on admission.  Oral supplementation ordered.  3. Anemia of chronic kidney disease Lab Results  Component Value Date   HGB 11.7 (L) 12/29/2022    Hemoglobin within optimal range.  Will continue to monitor for now  4.  Hypertension with chronic kidney disease.  Patient currently prescribed lisinopril outpatient.  Held in setting of kidney injury.    LOS: 1   8/15/20241:38 PM

## 2022-12-29 NOTE — Progress Notes (Signed)
PROGRESS NOTE    Teresa Jefferson  ZOX:[id number] DOB: [birth year] DOA: 12/28/2022 PCP: [facility]  Outpatient Specialists: none    Brief Narrative:   From admission h and p   Teresa Jefferson is a 57 y.o. female with medical history significant of polysubstance abuse, hypertension, asthma, bipolar disorder presenting with AKI, leukocytosis, polysubstance abuse.  Patient reports progressively worsening nausea vomiting and decreased urine output over multiple days.  Patient noted to have been recently in and out of jail in the legal system.  Is pending a court appearance with a ankle monitor in place.  Patient reports having generalized malaise over several days to weeks.  Does admit to illicit narcotic use as well as crack cocaine use.  Denies any IV drug use.  States that she has been taking lisinopril as well as 800 mg of ibuprofen 3 times a day for several months.  No chest pain or shortness of breath.  No orthopnea.  Minimal lower extremity swelling.  Denies any significant abdominal pain.  Has had some significant worsening nausea and vomiting.  Emesis not reported to be bloody or bilious.  No focal hemiparesis or confusion.   Assessment & Plan:   Principal Problem:   AKI (acute kidney injury) (HCC) Active Problems:   Essential hypertension   Bipolar disorder (HCC)   Leukocytosis   Polysubstance abuse (HCC)  # Acute kidney injury Prerenal from vomiting. Responding to fluids. Cr 2.45 today from 5.28 on admission, baseline is normal - continue IVF  # Nausea/vomiting Improving. Likely 2/2 opioid withdrawal. No diarrhea or fever to suggest infectious etiology. CT shows possible duodenitis/gastritis, patient has been taking nsaids at home - hold nsaids now and at discharge - start ppi - continue fluids - zofran prn  # Bipolar disorder - resum ehome seroquel  # HTN Here normotensive - holding home lisinopril, would substitute something else at d/c  #  Opioid abuse Declines suboxone. Uses fentanyl at baseline, last use yesterday morning - prn oxy here to avoid withdrawal - f/u hiv, rpr, hcv, hbv  # Left upper lobe pulmonary nodule Concerning for possible septic emboli - f/u blood cultures - CT with contrast when kidney function allows, may need to perform as outpt - f/u TTE   DVT prophylaxis: heparin Code Status: full Family Communication: none @ bedside  Level of care: Telemetry Medical Status is: Inpatient Remains inpatient appropriate because: need for IV fluids and inpatient monitoring    Consultants:  none  Procedures: none  Antimicrobials:  none    Subjective: Reports feeling anxious  Objective: Vitals:   12/29/22 0051 12/29/22 0805 12/29/22 1456 12/29/22 1538  BP: 105/70 101/66 134/70 134/76  Pulse: 66 75 91 80  Resp: 16 17 18 17   Temp: 97.9 F (36.6 C) 98.6 F (37 C)  99.1 F (37.3 C)  TempSrc:      SpO2: 96% 97% 97% 96%  Weight:      Height:        Intake/Output Summary (Last 24 hours) at 12/29/2022 1702 Last data filed at 12/29/2022 1300 Gross per 24 hour  Intake 400 ml  Output 1000 ml  Net -600 ml   Filed Weights   12/28/22 1017  Weight: 63.5 kg    Examination:  General exam: Appears anxious Respiratory system: Clear to auscultation. Respiratory effort normal. Cardiovascular system: S1 & S2 heard, RRR. No JVD, murmurs, rubs, gallops or clicks. No pedal edema. Gastrointestinal system: Abdomen is nondistended, soft and nontender. No organomegaly  or masses felt. Normal bowel sounds heard. Central nervous system: Alert and oriented. No focal neurological deficits. Extremities: Symmetric 5 x 5 power. Skin: No rashes, lesions or ulcers Psychiatry: anxious and slightly agitated     Data Reviewed: I have personally reviewed following labs and imaging studies  CBC: Recent Labs  Lab 12/28/22 1019 12/29/22 0347  WBC 16.2* 6.8  HGB 14.0 11.7*  HCT 43.3 34.6*  MCV 86.3 83.6  PLT 383  201   Basic Metabolic Panel: Recent Labs  Lab 12/28/22 1019 12/28/22 2211 12/29/22 0347  NA 130* 134* 136  K 3.3* 3.3* 3.2*  CL 92* 103 106  CO2 19* 17* 21*  GLUCOSE 156* 138* 139*  BUN 103* 86* 76*  CREATININE 5.28* 3.21* 2.45*  CALCIUM 7.3* 7.0* 7.3*   GFR: Estimated Creatinine Clearance: 22.5 mL/min (A) (by C-G formula based on SCr of 2.45 mg/dL (H)). Liver Function Tests: Recent Labs  Lab 12/28/22 1724 12/28/22 2211 12/29/22 0347  AST 18 16 15   ALT 19 16 13   ALKPHOS 59 50 46  BILITOT 0.4 0.2* 0.6  PROT 7.6 6.6 6.0*  ALBUMIN 4.1 3.6 3.2*   No results for input(s): "LIPASE", "AMYLASE" in the last 168 hours. No results for input(s): "AMMONIA" in the last 168 hours. Coagulation Profile: No results for input(s): "INR", "PROTIME" in the last 168 hours. Cardiac Enzymes: Recent Labs  Lab 12/28/22 1724  CKTOTAL 495*   BNP (last 3 results) No results for input(s): "PROBNP" in the last 8760 hours. HbA1C: No results for input(s): "HGBA1C" in the last 72 hours. CBG: No results for input(s): "GLUCAP" in the last 168 hours. Lipid Profile: No results for input(s): "CHOL", "HDL", "LDLCALC", "TRIG", "CHOLHDL", "LDLDIRECT" in the last 72 hours. Thyroid Function Tests: No results for input(s): "TSH", "T4TOTAL", "FREET4", "T3FREE", "THYROIDAB" in the last 72 hours. Anemia Panel: No results for input(s): "VITAMINB12", "FOLATE", "FERRITIN", "TIBC", "IRON", "RETICCTPCT" in the last 72 hours. Urine analysis:    Component Value Date/Time   COLORURINE YELLOW (A) 12/28/2022 1519   APPEARANCEUR HAZY (A) 12/28/2022 1519   LABSPEC 1.009 12/28/2022 1519   PHURINE 5.0 12/28/2022 1519   GLUCOSEU NEGATIVE 12/28/2022 1519   HGBUR MODERATE (A) 12/28/2022 1519   BILIRUBINUR NEGATIVE 12/28/2022 1519   KETONESUR NEGATIVE 12/28/2022 1519   PROTEINUR 100 (A) 12/28/2022 1519   NITRITE NEGATIVE 12/28/2022 1519   LEUKOCYTESUR LARGE (A) 12/28/2022 1519   Sepsis  Labs: @LABRCNTIP (procalcitonin:4,lacticidven:4)  ) Recent Results (from the past 240 hour(s))  Culture, blood (Routine X 2) w Reflex to ID Panel     Status: None (Preliminary result)   Collection Time: 12/28/22  3:47 PM   Specimen: BLOOD  Result Value Ref Range Status   Specimen Description BLOOD RIGHT ANTECUBITAL  Final   Special Requests   Final    BOTTLES DRAWN AEROBIC AND ANAEROBIC Blood Culture adequate volume   Culture   Final    NO GROWTH < 12 HOURS Performed at [facility], [address] , [place] [address]    Report Status PENDING  Incomplete  Culture, blood (Routine X 2) w Reflex to ID Panel     Status: None (Preliminary result)   Collection Time: 12/28/22  4:02 PM   Specimen: BLOOD  Result Value Ref Range Status   Specimen Description BLOOD BLOOD RIGHT WRIST  Final   Special Requests   Final    BOTTLES DRAWN AEROBIC AND ANAEROBIC Blood Culture adequate volume   Culture   Final  NO GROWTH < 12 HOURS Performed at [facility], [address] [place] [place], [place] [address]    Report Status PENDING  Incomplete         Radiology Studies: ECHOCARDIOGRAM COMPLETE  Result Date: 12/29/2022    ECHOCARDIOGRAM REPORT   Patient Name:   Teresa Jefferson [facility] Date of Exam: 12/29/2022 Medical Rec #:  [id number]        Height:       62.0 in Accession #:    [id number]       Weight:       140.0 lb Date of Birth:  1965/06/01        BSA:          1.643 m Patient Age:    56 years         BP:           101/66 mmHg Patient Gender: F                HR:           64 bpm. Exam Location:  [facility] Procedure: 2D Echo, Cardiac Doppler and Color Doppler Indications:     Endocarditis  History:         Patient has no prior history of Echocardiogram examinations.                  Endocarditis; Risk Factors:Hypertension.  Sonographer:     Mikki Harbor Referring Phys:  [phone] Francoise Schaumann [place] Diagnosing Phys: Julien Nordmann MD IMPRESSIONS  1. Left ventricular ejection fraction, by  estimation, is 60 to 65%. The left ventricle has normal function. The left ventricle has no regional wall motion abnormalities. Left ventricular diastolic parameters were normal.  2. Right ventricular systolic function is normal. The right ventricular size is normal. There is normal pulmonary artery systolic pressure. The estimated right ventricular systolic pressure is 22.5 mmHg.  3. The mitral valve is normal in structure. Mild mitral valve regurgitation. No evidence of mitral stenosis.  4. The aortic valve is normal in structure. There is mild calcification of the aortic valve. Aortic valve regurgitation is not visualized. Aortic valve sclerosis is present, with no evidence of aortic valve stenosis.  5. The inferior vena cava is normal in size with greater than 50% respiratory variability, suggesting right atrial pressure of 3 mmHg. FINDINGS  Left Ventricle: Left ventricular ejection fraction, by estimation, is 60 to 65%. The left ventricle has normal function. The left ventricle has no regional wall motion abnormalities. The left ventricular internal cavity size was normal in size. There is  no left ventricular hypertrophy. Left ventricular diastolic parameters were normal. Right Ventricle: The right ventricular size is normal. No increase in right ventricular wall thickness. Right ventricular systolic function is normal. There is normal pulmonary artery systolic pressure. The tricuspid regurgitant velocity is 2.21 m/s, and  with an assumed right atrial pressure of 3 mmHg, the estimated right ventricular systolic pressure is 22.5 mmHg. Left Atrium: Left atrial size was normal in size. Right Atrium: Right atrial size was normal in size. Pericardium: There is no evidence of pericardial effusion. Mitral Valve: The mitral valve is normal in structure. Mild mitral valve regurgitation. No evidence of mitral valve stenosis. MV peak gradient, 4.3 mmHg. The mean mitral valve gradient is 2.0 mmHg. Tricuspid Valve: The  tricuspid valve is normal in structure. Tricuspid valve regurgitation is mild . No evidence of tricuspid stenosis. Aortic Valve: The aortic valve is normal in structure. There is mild calcification  of the aortic valve. Aortic valve regurgitation is not visualized. Aortic valve sclerosis is present, with no evidence of aortic valve stenosis. Aortic valve mean gradient  measures 5.0 mmHg. Aortic valve peak gradient measures 13.4 mmHg. Aortic valve area, by VTI measures 2.72 cm. Pulmonic Valve: The pulmonic valve was normal in structure. Pulmonic valve regurgitation is not visualized. No evidence of pulmonic stenosis. Aorta: The aortic root is normal in size and structure. Venous: The inferior vena cava is normal in size with greater than 50% respiratory variability, suggesting right atrial pressure of 3 mmHg. IAS/Shunts: No atrial level shunt detected by color flow Doppler.  LEFT VENTRICLE PLAX 2D LVIDd:         4.00 cm   Diastology LVIDs:         2.40 cm   LV e' medial:    10.80 cm/s LV PW:         1.00 cm   LV E/e' medial:  8.1 LV IVS:        1.00 cm   LV e' lateral:   12.30 cm/s LVOT diam:     2.00 cm   LV E/e' lateral: 7.1 LV SV:         91 LV SV Index:   55 LVOT Area:     3.14 cm  RIGHT VENTRICLE RV Basal diam:  3.05 cm RV Mid diam:    2.40 cm RV S prime:     16.10 cm/s TAPSE (M-mode): 2.7 cm LEFT ATRIUM             Index        RIGHT ATRIUM           Index LA diam:        3.00 cm 1.83 cm/m   RA Area:     18.40 cm LA Vol (A2C):   66.8 ml 40.66 ml/m  RA Volume:   55.40 ml  33.72 ml/m LA Vol (A4C):   39.2 ml 23.86 ml/m LA Biplane Vol: 52.8 ml 32.14 ml/m  AORTIC VALVE                     PULMONIC VALVE AV Area (Vmax):    2.52 cm      PV Vmax:       1.24 m/s AV Area (Vmean):   2.59 cm      PV Peak grad:  6.2 mmHg AV Area (VTI):     2.72 cm AV Vmax:           183.00 cm/s AV Vmean:          104.000 cm/s AV VTI:            0.334 m AV Peak Grad:      13.4 mmHg AV Mean Grad:      5.0 mmHg LVOT Vmax:          147.00 cm/s LVOT Vmean:        85.800 cm/s LVOT VTI:          0.289 m LVOT/AV VTI ratio: 0.87  AORTA Ao Root diam: 3.40 cm MITRAL VALVE               TRICUSPID VALVE MV Area (PHT): 2.24 cm    TR Peak grad:   19.5 mmHg MV Area VTI:   2.56 cm    TR Vmax:        221.00 cm/s MV Peak grad:  4.3 mmHg MV Mean grad:  2.0 mmHg  SHUNTS MV Vmax:       1.04 m/s    Systemic VTI:  0.29 m MV Vmean:      57.7 cm/s   Systemic Diam: 2.00 cm MV Decel Time: 339 msec MV E velocity: 87.50 cm/s MV A velocity: 71.80 cm/s MV E/A ratio:  1.22 Julien Nordmann MD Electronically signed by Julien Nordmann MD Signature Date/Time: 12/29/2022/3:47:52 PM    Final    CT ABDOMEN PELVIS WO CONTRAST  Result Date: 12/28/2022 CLINICAL DATA:  Left-sided flank pain and back pain.  Oliguria EXAM: CT ABDOMEN AND PELVIS WITHOUT CONTRAST TECHNIQUE: Multidetector CT imaging of the abdomen and pelvis was performed following the standard protocol without IV contrast. RADIATION DOSE REDUCTION: This exam was performed according to the departmental dose-optimization program which includes automated exposure control, adjustment of the mA and/or kV according to patient size and/or use of iterative reconstruction technique. COMPARISON:  CT with contrast 08/10/2020.  Older exams as well FINDINGS: Lower chest: No pleural effusion lung bases. The small nodule in the right costophrenic angle laterally on the prior examination is smaller today. No additional follow-up. Hepatobiliary: Focal fat deposition seen in the liver in segment 4 adjacent to the falciform ligament previous cholecystectomy Pancreas: Unremarkable. No pancreatic ductal dilatation or surrounding inflammatory changes. Spleen: Normal in size without focal abnormality. Adrenals/Urinary Tract: The adrenal glands are preserved. There is a punctate nonobstructing upper pole left-sided renal stone best seen on coronal series 5, image 47. Lobular appearance of the kidneys. No ureteral stones preserved contours  of the urinary bladder. Stomach/Bowel: Moderate colonic stool. On this non oral contrast exam, large bowel has a normal course and caliber. Few sigmoid colon diverticula. Normal appendix. Small bowel is nondilated. There is some fluid and debris in the stomach there is some wall thickening of the proximal duodenal with the adjacent inflammatory stranding. No adjacent free air or fluid collection but please correlate for clinical evidence of duodenitis Vascular/Lymphatic: Aortic atherosclerosis. No enlarged abdominal or pelvic lymph nodes. Reproductive: Status post hysterectomy. No adnexal masses. Other: No abdominal wall hernia or abnormality. No abdominopelvic ascites. Musculoskeletal: Degenerative changes seen along the spine and pelvis. Trace anterolisthesis of L4 on L5 IMPRESSION: Mild wall thickening along the proximal duodenal with the adjacent inflammatory stranding please correlate for gastritis or duodenitis. No free air or free fluid. Recommend further evaluation. Punctate nonobstructing left-sided renal stone. Normal appendix.  Previous cholecystectomy Sigmoid colon diverticula Electronically Signed   By: Karen Kays M.D.   On: 12/28/2022 14:20   DG Chest Portable 1 View  Result Date: 12/28/2022 CLINICAL DATA:  Shortness of breath, left-sided chest pain EXAM: PORTABLE CHEST 1 VIEW COMPARISON:  09/25/2021 FINDINGS: 2 mm left upper lobe pulmonary nodule. 2 mm right upper lobe pulmonary nodule with possible central area of cavitation. No focal consolidation. No pleural effusion or pneumothorax. Heart and mediastinal contours are unremarkable. No acute osseous abnormality. IMPRESSION: 1. No acute cardiopulmonary disease. 2. A 2 mm left upper lobe pulmonary nodule. 2 mm right upper lobe pulmonary nodule with possible central area of cavitation. Findings concerning for an infectious etiology such as septic emboli. Recommend further evaluation with a CT of the chest with intravenous contrast. Electronically  Signed   By: Elige Ko M.D.   On: 12/28/2022 13:38        Scheduled Meds:  feeding supplement  1 Container Oral TID BM   gabapentin  100 mg Oral BID   heparin injection (subcutaneous)  5,000 Units Subcutaneous Q8H   pantoprazole (  PROTONIX) IV  40 mg Intravenous Q12H   QUEtiapine  25 mg Oral QHS   Continuous Infusions:  lactated ringers 1,000 mL with potassium chloride 10 mEq infusion 125 mL/hr at 12/29/22 0640     LOS: 1 day     Silvano Bilis, MD [facility]   If 7PM-7AM, please contact night-coverage [email or web address] Password [facility] 12/29/2022, 5:02 PM

## 2022-12-29 NOTE — Progress Notes (Signed)
*  PRELIMINARY RESULTS* Echocardiogram 2D Echocardiogram has been performed.  Teresa Jefferson 12/29/2022, 9:10 AM

## 2022-12-30 DIAGNOSIS — R911 Solitary pulmonary nodule: Secondary | ICD-10-CM | POA: Insufficient documentation

## 2022-12-30 LAB — BASIC METABOLIC PANEL
Anion gap: 8 (ref 5–15)
BUN: 26 mg/dL — ABNORMAL HIGH (ref 6–20)
CO2: 24 mmol/L (ref 22–32)
Calcium: 8.8 mg/dL — ABNORMAL LOW (ref 8.9–10.3)
Chloride: 109 mmol/L (ref 98–111)
Creatinine, Ser: 1.03 mg/dL — ABNORMAL HIGH (ref 0.44–1.00)
GFR, Estimated: >60 mL/min (ref >60)
Glucose, Bld: 156 mg/dL — ABNORMAL HIGH (ref 70–99)
Potassium: 3.5 mmol/L (ref 3.5–5.1)
Sodium: 141 mmol/L (ref 135–145)

## 2022-12-30 LAB — HEPATITIS B SURFACE ANTIGEN: Hepatitis B Surface Ag: NONREACTIVE

## 2022-12-30 NOTE — Discharge Summary (Signed)
Teresa Jefferson NFA:[id number] DOB: [birth year] DOA: 12/28/2022  PCP: [facility]  Admit date: 12/28/2022 Discharge date: 12/30/2022  Time spent: 25 minutes  Recommendations for Outpatient Follow-up:  F/u hbv, hcv, rpr (pending at time of patient leaving AMA) Consider outpatient contrast-enhanced chest CT Bmp at pcp f/u    Discharge Diagnoses:  Principal Problem:   AKI (acute kidney injury) (HCC) Active Problems:   Essential hypertension   Bipolar disorder (HCC)   Leukocytosis   Polysubstance abuse (HCC)   Lung nodule seen on imaging study   Discharge Condition: stable  Diet recommendation: regular  Filed Weights   12/28/22 1017  Weight: 63.5 kg    History of present illness:  From admission h and p Teresa Jefferson is a 57 y.o. female with medical history significant of polysubstance abuse, hypertension, asthma, bipolar disorder presenting with AKI, leukocytosis, polysubstance abuse.  Patient reports progressively worsening nausea vomiting and decreased urine output over multiple days.  Patient noted to have been recently in and out of jail in the legal system.  Is pending a court appearance with a ankle monitor in place.  Patient reports having generalized malaise over several days to weeks.  Does admit to illicit narcotic use as well as crack cocaine use.  Denies any IV drug use.  States that she has been taking lisinopril as well as 800 mg of ibuprofen 3 times a day for several months.  No chest pain or shortness of breath.  No orthopnea.  Minimal lower extremity swelling.  Denies any significant abdominal pain.  Has had some significant worsening nausea and vomiting.  Emesis not reported to be bloody or bilious.  No focal hemiparesis or confusion.   Hospital Course:  Patient presents with nausea and vomiting in the setting of frequent nsaid use and withdrawal from opioids (fentanyl). Found to have aki with cr of 5.28. CT with duodenitis/gastritis. AKI  resolved with fluids. Vomiting ceased. CXR showed left upper lobe pulmonary nodule concerning for septic emboli, advised contrast-enhanced CT of chest to further characterize. TTE obtained, no vegetations or other abnormalities. Blood cultures no growth to date. Patient left against medical advice on hospital day 2 prior to the return of her AM labs (which do show resolution of her AKI). Patient was advised to hold nsaids and plan would have been to substitute lisinopril for an alternate BP med. Would have also ordered outpatient CT of chest to characterize findings on CXR.  Procedures: none   Consultations: none  Discharge Exam: Vitals:   12/29/22 2314 12/30/22 0731  BP: (!) 162/79 (!) 185/82  Pulse: 81 67  Resp: 20 17  Temp: 98.3 F (36.8 C) 98.7 F (37.1 C)  SpO2: 98% 99%    No exam on discharge day (left AMA)  Discharge Instructions    Allergies as of 12/30/2022       Reactions   Amoxicillin Rash   Effexor [venlafaxine] Rash   "whelps"   Ketorolac Itching   Lamotrigine Rash   Sulfa Antibiotics Itching        Medication List     STOP taking these medications    HYDROcodone-acetaminophen 5-325 MG tablet Commonly known as: NORCO/VICODIN   ibuprofen 800 MG tablet Commonly known as: ADVIL   lidocaine 4 % Commonly known as: HM Lidocaine Patch   potassium chloride 10 MEQ tablet Commonly known as: KLOR-CON       TAKE these medications    gabapentin 600 MG tablet Commonly known as: NEURONTIN Take 600  mg by mouth 2 (two) times daily.   hydrocerin Crea Apply 1 application topically 3 (three) times daily.   hydrOXYzine 25 MG tablet Commonly known as: ATARAX Take 25 mg by mouth 2 (two) times daily.   lisinopril 20 MG tablet Commonly known as: ZESTRIL Take 20 mg by mouth daily.   ondansetron 4 MG tablet Commonly known as: Zofran Take 1 tablet (4 mg total) by mouth every 8 (eight) hours as needed for up to 10 doses for nausea or vomiting.    QUEtiapine 25 MG tablet Commonly known as: SEROQUEL Take 25 mg by mouth at bedtime as needed (Anxiety).   QUEtiapine 400 MG tablet Commonly known as: SEROquel Take 1 tablet (400 mg total) by mouth at bedtime.       Allergies  Allergen Reactions   Amoxicillin Rash   Effexor [Venlafaxine] Rash    "whelps"   Ketorolac Itching   Lamotrigine Rash   Sulfa Antibiotics Itching      The results of significant diagnostics from this hospitalization (including imaging, microbiology, ancillary and laboratory) are listed below for reference.    Significant Diagnostic Studies: ECHOCARDIOGRAM COMPLETE  Result Date: 12/29/2022    ECHOCARDIOGRAM REPORT   Patient Name:   Teresa Jefferson [facility] Date of Exam: 12/29/2022 Medical Rec #:  [id number]        Height:       62.0 in Accession #:    [id number]       Weight:       140.0 lb Date of Birth:  28-Jul-1965        BSA:          1.643 m Patient Age:    56 years         BP:           101/66 mmHg Patient Gender: F                HR:           64 bpm. Exam Location:  [facility] Procedure: 2D Echo, Cardiac Doppler and Color Doppler Indications:     Endocarditis  History:         Patient has no prior history of Echocardiogram examinations.                  Endocarditis; Risk Factors:Hypertension.  Sonographer:     Mikki Harbor Referring Phys:  [phone] Francoise Schaumann [place] Diagnosing Phys: Julien Nordmann MD IMPRESSIONS  1. Left ventricular ejection fraction, by estimation, is 60 to 65%. The left ventricle has normal function. The left ventricle has no regional wall motion abnormalities. Left ventricular diastolic parameters were normal.  2. Right ventricular systolic function is normal. The right ventricular size is normal. There is normal pulmonary artery systolic pressure. The estimated right ventricular systolic pressure is 22.5 mmHg.  3. The mitral valve is normal in structure. Mild mitral valve regurgitation. No evidence of mitral stenosis.  4. The aortic valve is normal in  structure. There is mild calcification of the aortic valve. Aortic valve regurgitation is not visualized. Aortic valve sclerosis is present, with no evidence of aortic valve stenosis.  5. The inferior vena cava is normal in size with greater than 50% respiratory variability, suggesting right atrial pressure of 3 mmHg. FINDINGS  Left Ventricle: Left ventricular ejection fraction, by estimation, is 60 to 65%. The left ventricle has normal function. The left ventricle has no regional wall motion abnormalities. The left ventricular internal cavity size was normal in size. There  is  no left ventricular hypertrophy. Left ventricular diastolic parameters were normal. Right Ventricle: The right ventricular size is normal. No increase in right ventricular wall thickness. Right ventricular systolic function is normal. There is normal pulmonary artery systolic pressure. The tricuspid regurgitant velocity is 2.21 m/s, and  with an assumed right atrial pressure of 3 mmHg, the estimated right ventricular systolic pressure is 22.5 mmHg. Left Atrium: Left atrial size was normal in size. Right Atrium: Right atrial size was normal in size. Pericardium: There is no evidence of pericardial effusion. Mitral Valve: The mitral valve is normal in structure. Mild mitral valve regurgitation. No evidence of mitral valve stenosis. MV peak gradient, 4.3 mmHg. The mean mitral valve gradient is 2.0 mmHg. Tricuspid Valve: The tricuspid valve is normal in structure. Tricuspid valve regurgitation is mild . No evidence of tricuspid stenosis. Aortic Valve: The aortic valve is normal in structure. There is mild calcification of the aortic valve. Aortic valve regurgitation is not visualized. Aortic valve sclerosis is present, with no evidence of aortic valve stenosis. Aortic valve mean gradient  measures 5.0 mmHg. Aortic valve peak gradient measures 13.4 mmHg. Aortic valve area, by VTI measures 2.72 cm. Pulmonic Valve: The pulmonic valve was normal in  structure. Pulmonic valve regurgitation is not visualized. No evidence of pulmonic stenosis. Aorta: The aortic root is normal in size and structure. Venous: The inferior vena cava is normal in size with greater than 50% respiratory variability, suggesting right atrial pressure of 3 mmHg. IAS/Shunts: No atrial level shunt detected by color flow Doppler.  LEFT VENTRICLE PLAX 2D LVIDd:         4.00 cm   Diastology LVIDs:         2.40 cm   LV e' medial:    10.80 cm/s LV PW:         1.00 cm   LV E/e' medial:  8.1 LV IVS:        1.00 cm   LV e' lateral:   12.30 cm/s LVOT diam:     2.00 cm   LV E/e' lateral: 7.1 LV SV:         91 LV SV Index:   55 LVOT Area:     3.14 cm  RIGHT VENTRICLE RV Basal diam:  3.05 cm RV Mid diam:    2.40 cm RV S prime:     16.10 cm/s TAPSE (M-mode): 2.7 cm LEFT ATRIUM             Index        RIGHT ATRIUM           Index LA diam:        3.00 cm 1.83 cm/m   RA Area:     18.40 cm LA Vol (A2C):   66.8 ml 40.66 ml/m  RA Volume:   55.40 ml  33.72 ml/m LA Vol (A4C):   39.2 ml 23.86 ml/m LA Biplane Vol: 52.8 ml 32.14 ml/m  AORTIC VALVE                     PULMONIC VALVE AV Area (Vmax):    2.52 cm      PV Vmax:       1.24 m/s AV Area (Vmean):   2.59 cm      PV Peak grad:  6.2 mmHg AV Area (VTI):     2.72 cm AV Vmax:           183.00 cm/s AV Vmean:  104.000 cm/s AV VTI:            0.334 m AV Peak Grad:      13.4 mmHg AV Mean Grad:      5.0 mmHg LVOT Vmax:         147.00 cm/s LVOT Vmean:        85.800 cm/s LVOT VTI:          0.289 m LVOT/AV VTI ratio: 0.87  AORTA Ao Root diam: 3.40 cm MITRAL VALVE               TRICUSPID VALVE MV Area (PHT): 2.24 cm    TR Peak grad:   19.5 mmHg MV Area VTI:   2.56 cm    TR Vmax:        221.00 cm/s MV Peak grad:  4.3 mmHg MV Mean grad:  2.0 mmHg    SHUNTS MV Vmax:       1.04 m/s    Systemic VTI:  0.29 m MV Vmean:      57.7 cm/s   Systemic Diam: 2.00 cm MV Decel Time: 339 msec MV E velocity: 87.50 cm/s MV A velocity: 71.80 cm/s MV E/A ratio:  1.22 Julien Nordmann MD Electronically signed by Julien Nordmann MD Signature Date/Time: 12/29/2022/3:47:52 PM    Final    CT ABDOMEN PELVIS WO CONTRAST  Result Date: 12/28/2022 CLINICAL DATA:  Left-sided flank pain and back pain.  Oliguria EXAM: CT ABDOMEN AND PELVIS WITHOUT CONTRAST TECHNIQUE: Multidetector CT imaging of the abdomen and pelvis was performed following the standard protocol without IV contrast. RADIATION DOSE REDUCTION: This exam was performed according to the departmental dose-optimization program which includes automated exposure control, adjustment of the mA and/or kV according to patient size and/or use of iterative reconstruction technique. COMPARISON:  CT with contrast 08/10/2020.  Older exams as well FINDINGS: Lower chest: No pleural effusion lung bases. The small nodule in the right costophrenic angle laterally on the prior examination is smaller today. No additional follow-up. Hepatobiliary: Focal fat deposition seen in the liver in segment 4 adjacent to the falciform ligament previous cholecystectomy Pancreas: Unremarkable. No pancreatic ductal dilatation or surrounding inflammatory changes. Spleen: Normal in size without focal abnormality. Adrenals/Urinary Tract: The adrenal glands are preserved. There is a punctate nonobstructing upper pole left-sided renal stone best seen on coronal series 5, image 47. Lobular appearance of the kidneys. No ureteral stones preserved contours of the urinary bladder. Stomach/Bowel: Moderate colonic stool. On this non oral contrast exam, large bowel has a normal course and caliber. Few sigmoid colon diverticula. Normal appendix. Small bowel is nondilated. There is some fluid and debris in the stomach there is some wall thickening of the proximal duodenal with the adjacent inflammatory stranding. No adjacent free air or fluid collection but please correlate for clinical evidence of duodenitis Vascular/Lymphatic: Aortic atherosclerosis. No enlarged abdominal or pelvic  lymph nodes. Reproductive: Status post hysterectomy. No adnexal masses. Other: No abdominal wall hernia or abnormality. No abdominopelvic ascites. Musculoskeletal: Degenerative changes seen along the spine and pelvis. Trace anterolisthesis of L4 on L5 IMPRESSION: Mild wall thickening along the proximal duodenal with the adjacent inflammatory stranding please correlate for gastritis or duodenitis. No free air or free fluid. Recommend further evaluation. Punctate nonobstructing left-sided renal stone. Normal appendix.  Previous cholecystectomy Sigmoid colon diverticula Electronically Signed   By: Karen Kays M.D.   On: 12/28/2022 14:20   DG Chest Portable 1 View  Result Date: 12/28/2022 CLINICAL DATA:  Shortness of breath,  left-sided chest pain EXAM: PORTABLE CHEST 1 VIEW COMPARISON:  09/25/2021 FINDINGS: 2 mm left upper lobe pulmonary nodule. 2 mm right upper lobe pulmonary nodule with possible central area of cavitation. No focal consolidation. No pleural effusion or pneumothorax. Heart and mediastinal contours are unremarkable. No acute osseous abnormality. IMPRESSION: 1. No acute cardiopulmonary disease. 2. A 2 mm left upper lobe pulmonary nodule. 2 mm right upper lobe pulmonary nodule with possible central area of cavitation. Findings concerning for an infectious etiology such as septic emboli. Recommend further evaluation with a CT of the chest with intravenous contrast. Electronically Signed   By: Elige Ko M.D.   On: 12/28/2022 13:38    Microbiology: Recent Results (from the past 240 hour(s))  Culture, blood (Routine X 2) w Reflex to ID Panel     Status: None (Preliminary result)   Collection Time: 12/28/22  3:47 PM   Specimen: BLOOD  Result Value Ref Range Status   Specimen Description BLOOD RIGHT ANTECUBITAL  Final   Special Requests   Final    BOTTLES DRAWN AEROBIC AND ANAEROBIC Blood Culture adequate volume   Culture   Final    NO GROWTH 2 DAYS Performed at [facility], [address] [place], [place] [address]    Report Status PENDING  Incomplete  Culture, blood (Routine X 2) w Reflex to ID Panel     Status: None (Preliminary result)   Collection Time: 12/28/22  4:02 PM   Specimen: BLOOD  Result Value Ref Range Status   Specimen Description BLOOD BLOOD RIGHT WRIST  Final   Special Requests   Final    BOTTLES DRAWN AEROBIC AND ANAEROBIC Blood Culture adequate volume   Culture   Final    NO GROWTH 2 DAYS Performed at [facility], [address] [place], [place] [address]    Report Status PENDING  Incomplete     Labs: Basic Metabolic Panel: Recent Labs  Lab 12/28/22 1019 12/28/22 2211 12/29/22 0347 12/30/22 0830  NA 130* 134* 136 141  K 3.3* 3.3* 3.2* 3.5  CL 92* 103 106 109  CO2 19* 17* 21* 24  GLUCOSE 156* 138* 139* 156*  BUN 103* 86* 76* 26*  CREATININE 5.28* 3.21* 2.45* 1.03*  CALCIUM 7.3* 7.0* 7.3* 8.8*   Liver Function Tests: Recent Labs  Lab 12/28/22 1724 12/28/22 2211 12/29/22 0347  AST 18 16 15   ALT 19 16 13   ALKPHOS 59 50 46  BILITOT 0.4 0.2* 0.6  PROT 7.6 6.6 6.0*  ALBUMIN 4.1 3.6 3.2*   No results for input(s): "LIPASE", "AMYLASE" in the last 168 hours. No results for input(s): "AMMONIA" in the last 168 hours. CBC: Recent Labs  Lab 12/28/22 1019 12/29/22 0347  WBC 16.2* 6.8  HGB 14.0 11.7*  HCT 43.3 34.6*  MCV 86.3 83.6  PLT 383 201   Cardiac Enzymes: Recent Labs  Lab 12/28/22 1724  CKTOTAL 495*   BNP: BNP (last 3 results) No results for input(s): "BNP" in the last 8760 hours.  ProBNP (last 3 results) No results for input(s): "PROBNP" in the last 8760 hours.  CBG: No results for input(s): "GLUCAP" in the last 168 hours.     Signed:  Silvano Bilis MD.  [facility] 12/30/2022, 9:47 AM

## 2022-12-30 NOTE — Progress Notes (Signed)
Pt left AMA after pt was so agitated to go home. Uncle came in to pick up the pt. MD was made aware and will not DC the pt until lab results come out. Pt removed IV herself. Pt signed AMA paper at 0910am and kept in the chart.

## 2022-12-30 NOTE — Plan of Care (Signed)
  Problem: Education: Goal: Knowledge of General Education information will improve Description Including pain rating scale, medication(s)/side effects and non-pharmacologic comfort measures Outcome: Progressing   

## 2022-12-31 LAB — RPR: RPR Ser Ql: NONREACTIVE

## 2022-12-31 LAB — HCV INTERPRETATION

## 2022-12-31 LAB — HCV AB W REFLEX TO QUANT PCR: HCV Ab: NONREACTIVE

## 2023-01-02 LAB — CULTURE, BLOOD (ROUTINE X 2)
Culture: NO GROWTH
Culture: NO GROWTH
Special Requests: ADEQUATE
Special Requests: ADEQUATE
